# Patient Record
Sex: Male | Born: 1940 | Race: Black or African American | Hispanic: No | Marital: Married | Smoking: Former smoker
Health system: Southern US, Community
[De-identification: ages and names within clinical notes are randomized; demographics above are authoritative.]

## PROBLEM LIST (undated history)

## (undated) DIAGNOSIS — I2510 Atherosclerotic heart disease of native coronary artery without angina pectoris: Secondary | ICD-10-CM

## (undated) DIAGNOSIS — I10 Essential (primary) hypertension: Secondary | ICD-10-CM

## (undated) DIAGNOSIS — R1310 Dysphagia, unspecified: Secondary | ICD-10-CM

## (undated) DIAGNOSIS — I219 Acute myocardial infarction, unspecified: Secondary | ICD-10-CM

## (undated) DIAGNOSIS — E119 Type 2 diabetes mellitus without complications: Secondary | ICD-10-CM

## (undated) DIAGNOSIS — M1990 Unspecified osteoarthritis, unspecified site: Secondary | ICD-10-CM

## (undated) DIAGNOSIS — R350 Frequency of micturition: Secondary | ICD-10-CM

## (undated) DIAGNOSIS — I209 Angina pectoris, unspecified: Secondary | ICD-10-CM

## (undated) DIAGNOSIS — M109 Gout, unspecified: Secondary | ICD-10-CM

## (undated) DIAGNOSIS — D61818 Other pancytopenia: Secondary | ICD-10-CM

## (undated) DIAGNOSIS — N189 Chronic kidney disease, unspecified: Secondary | ICD-10-CM

## (undated) DIAGNOSIS — M5030 Other cervical disc degeneration, unspecified cervical region: Secondary | ICD-10-CM

## (undated) DIAGNOSIS — R0601 Orthopnea: Secondary | ICD-10-CM

## (undated) DIAGNOSIS — E785 Hyperlipidemia, unspecified: Secondary | ICD-10-CM

## (undated) DIAGNOSIS — K219 Gastro-esophageal reflux disease without esophagitis: Secondary | ICD-10-CM

## (undated) DIAGNOSIS — IMO0001 Reserved for inherently not codable concepts without codable children: Secondary | ICD-10-CM

## (undated) DIAGNOSIS — I639 Cerebral infarction, unspecified: Secondary | ICD-10-CM

## (undated) DIAGNOSIS — I509 Heart failure, unspecified: Secondary | ICD-10-CM

## (undated) HISTORY — PX: CARDIAC CATHETERIZATION: SHX172

## (undated) HISTORY — PX: EYE SURGERY: SHX253

## (undated) HISTORY — PX: APPENDECTOMY: SHX54

## (undated) HISTORY — PX: BACK SURGERY: SHX140

---

## 1992-02-25 HISTORY — PX: CORONARY ARTERY BYPASS GRAFT: SHX141

## 2006-05-07 ENCOUNTER — Ambulatory Visit: Payer: Self-pay | Admitting: Family Medicine

## 2006-08-24 ENCOUNTER — Ambulatory Visit: Payer: Self-pay | Admitting: Internal Medicine

## 2007-02-22 ENCOUNTER — Ambulatory Visit: Payer: Self-pay | Admitting: Gastroenterology

## 2007-03-14 ENCOUNTER — Emergency Department: Payer: Self-pay | Admitting: Emergency Medicine

## 2009-09-24 ENCOUNTER — Ambulatory Visit: Payer: Self-pay | Admitting: Oncology

## 2009-10-08 ENCOUNTER — Ambulatory Visit: Payer: Self-pay | Admitting: Oncology

## 2009-10-25 ENCOUNTER — Ambulatory Visit: Payer: Self-pay | Admitting: Oncology

## 2009-11-24 ENCOUNTER — Ambulatory Visit: Payer: Self-pay | Admitting: Oncology

## 2009-12-25 ENCOUNTER — Ambulatory Visit: Payer: Self-pay | Admitting: Oncology

## 2010-01-08 ENCOUNTER — Ambulatory Visit: Payer: Self-pay | Admitting: Gastroenterology

## 2010-01-24 ENCOUNTER — Ambulatory Visit: Payer: Self-pay | Admitting: Oncology

## 2011-04-20 DIAGNOSIS — I10 Essential (primary) hypertension: Secondary | ICD-10-CM | POA: Insufficient documentation

## 2011-04-20 DIAGNOSIS — E119 Type 2 diabetes mellitus without complications: Secondary | ICD-10-CM | POA: Insufficient documentation

## 2011-04-20 DIAGNOSIS — M109 Gout, unspecified: Secondary | ICD-10-CM | POA: Insufficient documentation

## 2011-04-20 DIAGNOSIS — I2510 Atherosclerotic heart disease of native coronary artery without angina pectoris: Secondary | ICD-10-CM | POA: Insufficient documentation

## 2011-05-21 DIAGNOSIS — I200 Unstable angina: Secondary | ICD-10-CM | POA: Insufficient documentation

## 2011-11-05 DIAGNOSIS — N529 Male erectile dysfunction, unspecified: Secondary | ICD-10-CM | POA: Insufficient documentation

## 2011-11-05 DIAGNOSIS — N401 Enlarged prostate with lower urinary tract symptoms: Secondary | ICD-10-CM | POA: Insufficient documentation

## 2011-11-05 DIAGNOSIS — N411 Chronic prostatitis: Secondary | ICD-10-CM | POA: Insufficient documentation

## 2011-11-05 DIAGNOSIS — R972 Elevated prostate specific antigen [PSA]: Secondary | ICD-10-CM | POA: Insufficient documentation

## 2012-06-10 ENCOUNTER — Ambulatory Visit: Payer: Self-pay | Admitting: Ophthalmology

## 2012-06-10 LAB — POTASSIUM: Potassium: 4 mmol/L (ref 3.5–5.1)

## 2012-06-29 ENCOUNTER — Ambulatory Visit: Payer: Self-pay | Admitting: Ophthalmology

## 2013-08-25 DIAGNOSIS — N183 Chronic kidney disease, stage 3 unspecified: Secondary | ICD-10-CM | POA: Insufficient documentation

## 2014-06-16 NOTE — Op Note (Signed)
PATIENT NAME:  Robert Parks, Robert Parks MR#:  [id number] DATE OF BIRTH:  May 19, 1940  DATE OF PROCEDURE:  06/29/2012  PREOPERATIVE DIAGNOSIS: Visually significant cataract of the left eye.   POSTOPERATIVE DIAGNOSIS: Visually significant cataract of the left eye.   OPERATIVE PROCEDURE: Cataract extraction by phacoemulsification with implant of intraocular lens to left eye.   SURGEON: Birder Robson, MD.   ANESTHESIA:  1. Managed anesthesia care.  2. Topical tetracaine drops followed by 2% Xylocaine jelly applied in the preoperative holding area.   COMPLICATIONS: None.   TECHNIQUE:  Stop and chop.   DESCRIPTION OF PROCEDURE: The patient was examined and consented in the preoperative holding area where the aforementioned topical anesthesia was applied to the left eye and then brought back to the Operating Room where the left eye was prepped and draped in the usual sterile ophthalmic fashion and a lid speculum was placed. A paracentesis was created with the side port blade and the anterior chamber was filled with viscoelastic. A near clear corneal incision was performed with the steel keratome. A continuous curvilinear capsulorrhexis was performed with a cystotome followed by the capsulorrhexis forceps. Hydrodissection and hydrodelineation were carried out with BSS on a blunt cannula. The lens was removed in a stop and chop technique and the remaining cortical material was removed with the irrigation-aspiration handpiece. The capsular bag was inflated with viscoelastic and the Tecnis ZCB00 23.5-diopter lens, serial number [id number], was placed in the capsular bag without complication. The remaining viscoelastic was removed from the eye with the irrigation-aspiration handpiece. The wounds were hydrated. The anterior chamber was flushed with Miostat and the eye was inflated to physiologic pressure. 0.1 mL of cefuroxime concentration 10 mg/mL was placed in the anterior chamber. The wounds were found to be water  tight. The eye was dressed with Vigamox. The patient was given protective glasses to wear throughout the day and a shield with which to sleep tonight. The patient was also given drops with which to begin a drop regimen today and will follow-up with me in one day.    ____________________________ Livingston Diones. Novalie Leamy, MD wlp:gb D: 06/29/2012 19:33:45 ET T: 06/30/2012 04:01:42 ET JOB#: [id number]  cc: Drakkar Medeiros L. Alfonse Garringer, MD, <Dictator> Livingston Diones Darvis Croft MD ELECTRONICALLY SIGNED 07/05/2012 14:35

## 2014-08-21 ENCOUNTER — Encounter
Admission: RE | Disposition: A | Discharge status: Home / Self Care | Payer: Self-pay | Source: Ambulatory Visit | Attending: Gastroenterology

## 2014-08-21 ENCOUNTER — Ambulatory Visit: Payer: Medicare HMO | Admitting: Anesthesiology

## 2014-08-21 ENCOUNTER — Ambulatory Visit
Admission: RE | Admit: 2014-08-21 | Discharge: 2014-08-21 | Disposition: A | Discharge status: Home / Self Care | Payer: Medicare HMO | Source: Ambulatory Visit | Attending: Gastroenterology | Admitting: Gastroenterology

## 2014-08-21 ENCOUNTER — Encounter: Payer: Self-pay | Admitting: Gastroenterology

## 2014-08-21 DIAGNOSIS — Z86010 Personal history of colonic polyps: Secondary | ICD-10-CM | POA: Diagnosis present

## 2014-08-21 DIAGNOSIS — Z87891 Personal history of nicotine dependence: Secondary | ICD-10-CM | POA: Insufficient documentation

## 2014-08-21 DIAGNOSIS — K5730 Diverticulosis of large intestine without perforation or abscess without bleeding: Secondary | ICD-10-CM | POA: Insufficient documentation

## 2014-08-21 DIAGNOSIS — Z7982 Long term (current) use of aspirin: Secondary | ICD-10-CM | POA: Insufficient documentation

## 2014-08-21 DIAGNOSIS — I2510 Atherosclerotic heart disease of native coronary artery without angina pectoris: Secondary | ICD-10-CM | POA: Insufficient documentation

## 2014-08-21 DIAGNOSIS — D124 Benign neoplasm of descending colon: Secondary | ICD-10-CM | POA: Insufficient documentation

## 2014-08-21 DIAGNOSIS — D123 Benign neoplasm of transverse colon: Secondary | ICD-10-CM | POA: Diagnosis not present

## 2014-08-21 DIAGNOSIS — Z79899 Other long term (current) drug therapy: Secondary | ICD-10-CM | POA: Insufficient documentation

## 2014-08-21 DIAGNOSIS — I10 Essential (primary) hypertension: Secondary | ICD-10-CM | POA: Diagnosis not present

## 2014-08-21 HISTORY — DX: Hyperlipidemia, unspecified: E78.5

## 2014-08-21 HISTORY — DX: Type 2 diabetes mellitus without complications: E11.9

## 2014-08-21 HISTORY — DX: Essential (primary) hypertension: I10

## 2014-08-21 HISTORY — PX: COLONOSCOPY WITH PROPOFOL: SHX5780

## 2014-08-21 HISTORY — DX: Atherosclerotic heart disease of native coronary artery without angina pectoris: I25.10

## 2014-08-21 LAB — GLUCOSE, CAPILLARY: Glucose-Capillary: 95 mg/dL (ref 65–99)

## 2014-08-21 SURGERY — COLONOSCOPY WITH PROPOFOL
Anesthesia: General

## 2014-08-21 MED ORDER — PROPOFOL INFUSION 10 MG/ML OPTIME
INTRAVENOUS | Status: DC | PRN
  Administered 2014-08-21: 160 ug/kg/min via INTRAVENOUS

## 2014-08-21 MED ORDER — MIDAZOLAM HCL 2 MG/2ML IJ SOLN
INTRAMUSCULAR | Status: DC | PRN
  Administered 2014-08-21: 1 mg via INTRAVENOUS

## 2014-08-21 MED ORDER — SODIUM CHLORIDE 0.9 % IV SOLN
INTRAVENOUS | Status: DC
  Administered 2014-08-21: 10:00:00 via INTRAVENOUS

## 2014-08-21 MED ORDER — FENTANYL CITRATE (PF) 100 MCG/2ML IJ SOLN
INTRAMUSCULAR | Status: DC | PRN
  Administered 2014-08-21: 50 ug via INTRAVENOUS

## 2014-08-21 MED ORDER — PROPOFOL 10 MG/ML IV BOLUS
INTRAVENOUS | Status: DC | PRN
  Administered 2014-08-21: 50 mg via INTRAVENOUS

## 2014-08-21 MED ORDER — LIDOCAINE HCL (CARDIAC) 20 MG/ML IV SOLN
INTRAVENOUS | Status: DC | PRN
  Administered 2014-08-21: 40 mg via INTRAVENOUS

## 2014-08-21 NOTE — Anesthesia Procedure Notes (Signed)
Date/Time: 08/21/2014 9:54 AM Performed by: Doreen Salvage Pre-anesthesia Checklist: Patient identified, Emergency Drugs available, Suction available and Patient being monitored Patient Re-evaluated:Patient Re-evaluated prior to inductionOxygen Delivery Method: Nasal cannula

## 2014-08-21 NOTE — Transfer of Care (Signed)
Immediate Anesthesia Transfer of Care Note  Patient: Robert Parks  Procedure(s) Performed: Procedure(s): COLONOSCOPY WITH PROPOFOL (N/A)  Patient Location: PACU and Endoscopy Unit  Anesthesia Type:General  Level of Consciousness: sedated  Airway & Oxygen Therapy: Patient Spontanous Breathing and Patient connected to nasal cannula oxygen  Post-op Assessment: Report given to RN and Post -op Vital signs reviewed and stable  Post vital signs: Reviewed and stable  Last Vitals:  Filed Vitals:   08/21/14 1017  BP: 110/65  Pulse: 58  Temp: 35.8 C  Resp: 12    Complications: No apparent anesthesia complications

## 2014-08-21 NOTE — H&P (Signed)
    Primary Care Physician:  WHITE, Arlie Solomons, FNP Primary Gastroenterologist:  Dr. Candace Cruise  Pre-Procedure History & Physical: HPI:  Jarquavious Fentress is a 74 y.o. male is here for a colonoscopy.  History reviewed. No pertinent past medical history.  History reviewed. No pertinent past surgical history.  Prior to Admission medications   Medication Sig Start Date End Date Taking? Authorizing Provider  amLODipine (NORVASC) 10 MG tablet Take 10 mg by mouth daily.   Yes Historical Provider, MD  aspirin 81 MG tablet Take 81 mg by mouth daily.   Yes Historical Provider, MD  atorvastatin (LIPITOR) 10 MG tablet Take 10 mg by mouth daily.   Yes Historical Provider, MD  colchicine 0.6 MG tablet Take 0.6 mg by mouth daily.   Yes Historical Provider, MD  hydrALAZINE (APRESOLINE) 100 MG tablet Take 100 mg by mouth 2 (two) times daily.   Yes Historical Provider, MD  isosorbide mononitrate (IMDUR) 120 MG 24 hr tablet Take 120 mg by mouth daily.   Yes Historical Provider, MD  metoprolol (TOPROL-XL) 200 MG 24 hr tablet Take 200 mg by mouth daily.   Yes Historical Provider, MD  polyethylene glycol-electrolytes (NULYTELY/GOLYTELY) 420 G solution Take 4,000 mLs by mouth once.   Yes Historical Provider, MD  potassium chloride SA (K-DUR,KLOR-CON) 20 MEQ tablet Take 20 mEq by mouth daily.   Yes Historical Provider, MD  ranolazine (RANEXA) 1000 MG SR tablet Take 500 mg by mouth 2 (two) times daily.   Yes Historical Provider, MD  tamsulosin (FLOMAX) 0.4 MG CAPS capsule Take 0.4 mg by mouth daily.   Yes Historical Provider, MD    Allergies as of 07/26/2014  . (Not on File)    History reviewed. No pertinent family history.  History   Social History  . Marital Status: Married    Spouse Name: N/A  . Number of Children: N/A  . Years of Education: N/A   Occupational History  . Not on file.   Social History Main Topics  . Smoking status: Not on file  . Smokeless tobacco: Not on file  . Alcohol Use: Not on file   . Drug Use: Not on file  . Sexual Activity: Not on file   Other Topics Concern  . Not on file   Social History Narrative  . No narrative on file    Review of Systems: See HPI, otherwise negative ROS  Physical Exam: There were no vitals taken for this visit. General:   Alert,  pleasant and cooperative in NAD Head:  Normocephalic and atraumatic. Neck:  Supple; no masses or thyromegaly. Lungs:  Clear throughout to auscultation.    Heart:  Regular rate and rhythm. Abdomen:  Soft, nontender and nondistended. Normal bowel sounds, without guarding, and without rebound.   Neurologic:  Alert and  oriented x4;  grossly normal neurologically.  Impression/Plan: Javyn Havlin is here for an colonoscopy to be performed for personal hx of colon polyps.  Risks, benefits, limitations, and alternatives regarding colonoscopy have been reviewed with the patient.  Questions have been answered.  All parties agreeable.   Isaish Alemu, Lupita Dawn, MD  08/21/2014, 8:55 AM

## 2014-08-21 NOTE — Anesthesia Preprocedure Evaluation (Signed)
Anesthesia Evaluation  Patient identified by MRN, date of birth, ID band Patient awake    Reviewed: Allergy & Precautions, H&P , NPO status , Patient's Chart, lab work & pertinent test results, reviewed documented beta blocker date and time   Airway Mallampati: II  TM Distance: >3 FB Neck ROM: full    Dental no notable dental hx.    Pulmonary neg pulmonary ROS, former smoker,  breath sounds clear to auscultation  Pulmonary exam normal       Cardiovascular Exercise Tolerance: Poor hypertension, + CAD negative cardio ROS  Rhythm:regular Rate:Normal     Neuro/Psych negative neurological ROS  negative psych ROS   GI/Hepatic negative GI ROS, Neg liver ROS,   Endo/Other  negative endocrine ROSdiabetes  Renal/GU negative Renal ROS  negative genitourinary   Musculoskeletal   Abdominal   Peds  Hematology negative hematology ROS (+)   Anesthesia Other Findings   Reproductive/Obstetrics negative OB ROS                             Anesthesia Physical Anesthesia Plan  ASA: III  Anesthesia Plan: General   Post-op Pain Management:    Induction:   Airway Management Planned:   Additional Equipment:   Intra-op Plan:   Post-operative Plan:   Informed Consent: I have reviewed the patients History and Physical, chart, labs and discussed the procedure including the risks, benefits and alternatives for the proposed anesthesia with the patient or authorized representative who has indicated his/her understanding and acceptance.   Dental Advisory Given  Plan Discussed with: CRNA  Anesthesia Plan Comments:         Anesthesia Quick Evaluation

## 2014-08-21 NOTE — Op Note (Signed)
[facility] Gastroenterology Patient Name: Robert Parks Procedure Date: 08/21/2014 9:52 AM MRN: [id number] Account #: [id number] Date of Birth: [birth year] Admit Type: Outpatient Age: 74 Room: [place] ENDO ROOM 4 Gender: Male Note Status: Finalized Procedure:         Colonoscopy Indications:       Personal history of colonic polyps Providers:         Lupita Dawn. Candace Cruise, MD Referring MD:      Arlie Solomons. White (Referring MD) Medicines:         Monitored Anesthesia Care Complications:     No immediate complications. Procedure:         Pre-Anesthesia Assessment:                    - Prior to the procedure, a History and Physical was                     performed, and patient medications, allergies and                     sensitivities were reviewed. The patient's tolerance of                     previous anesthesia was reviewed.                    - The risks and benefits of the procedure and the sedation                     options and risks were discussed with the patient. All                     questions were answered and informed consent was obtained.                    - After reviewing the risks and benefits, the patient was                     deemed in satisfactory condition to undergo the procedure.                    After obtaining informed consent, the colonoscope was                     passed under direct vision. Throughout the procedure, the                     patient's blood pressure, pulse, and oxygen saturations                     were monitored continuously. The Olympus CF-H180AL                     colonoscope ( S#: [id number] ) was introduced through the                     anus and advanced to the the cecum, identified by                     appendiceal orifice and ileocecal valve. The colonoscopy                     was performed without difficulty. The patient tolerated  the procedure well. The quality of the bowel preparation               was good. Findings:      Multiple small-mouthed diverticula were found in the ascending colon.      A small polyp was found in the transverse colon. The polyp was sessile.       The polyp was removed with a cold snare. Resection and retrieval were       complete.      Three sessile polyps were found in the descending colon. The polyps were       small in size. These polyps were removed with a cold snare. Resection       and retrieval were complete.      The exam was otherwise without abnormality. Impression:        - Diverticulosis in the ascending colon.                    - One small polyp in the transverse colon. Resected and                     retrieved.                    - Three small polyps in the descending colon. Resected and                     retrieved.                    - The examination was otherwise normal. Recommendation:    - Discharge patient to home.                    - Await pathology results.                    - Repeat colonoscopy in 3 years for surveillance based on                     pathology results.                    - The findings and recommendations were discussed with the                     patient.                    - Hold bASA for at least 3 more days.. Procedure Code(s): --- Professional ---                    [phone], Colonoscopy, flexible; with removal of tumor(s),                     polyp(s), or other lesion(s) by snare technique Diagnosis Code(s): --- Professional ---                    D12.3, Benign neoplasm of transverse colon                    D12.4, Benign neoplasm of descending colon                    Z86.010, Personal history of colonic polyps                    K57.30, Diverticulosis of large intestine  without                     perforation or abscess without bleeding CPT copyright 2014 American Medical Association. All rights reserved. The codes documented in this report are preliminary and upon coder review may  be  revised to meet current compliance requirements. Hulen Luster, MD 08/21/2014 10:12:58 AM This report has been signed electronically. Number of Addenda: 0 Note Initiated On: 08/21/2014 9:52 AM Scope Withdrawal Time: 0 hours 9 minutes 7 seconds  Total Procedure Duration: 0 hours 12 minutes 46 seconds       [facility]

## 2014-08-22 LAB — SURGICAL PATHOLOGY

## 2014-08-22 NOTE — Anesthesia Postprocedure Evaluation (Signed)
  Anesthesia Post-op Note  Patient: Robert Parks  Procedure(s) Performed: Procedure(s): COLONOSCOPY WITH PROPOFOL (N/A)  Anesthesia type:General  Patient location: PACU  Post pain: Pain level controlled  Post assessment: Post-op Vital signs reviewed, Patient's Cardiovascular Status Stable, Respiratory Function Stable, Patent Airway and No signs of Nausea or vomiting  Post vital signs: Reviewed and stable  Last Vitals:  Filed Vitals:   08/21/14 1050  BP: 130/72  Pulse: 58  Temp:   Resp: 20    Level of consciousness: awake, alert  and patient cooperative  Complications: No apparent anesthesia complications

## 2014-08-23 ENCOUNTER — Encounter: Payer: Self-pay | Admitting: Gastroenterology

## 2015-05-16 DIAGNOSIS — R531 Weakness: Secondary | ICD-10-CM | POA: Insufficient documentation

## 2015-05-16 DIAGNOSIS — M542 Cervicalgia: Secondary | ICD-10-CM | POA: Insufficient documentation

## 2015-05-31 ENCOUNTER — Inpatient Hospital Stay: Payer: Medicare HMO | Admitting: Oncology

## 2015-06-06 ENCOUNTER — Other Ambulatory Visit: Payer: Self-pay | Admitting: Neurology

## 2015-06-06 DIAGNOSIS — M542 Cervicalgia: Secondary | ICD-10-CM

## 2015-06-06 DIAGNOSIS — R531 Weakness: Secondary | ICD-10-CM

## 2015-06-08 ENCOUNTER — Ambulatory Visit
Admission: RE | Admit: 2015-06-08 | Discharge: 2015-06-08 | Disposition: A | Discharge status: Home / Self Care | Payer: Medicare HMO | Source: Ambulatory Visit | Attending: Neurology | Admitting: Neurology

## 2015-06-08 DIAGNOSIS — R9089 Other abnormal findings on diagnostic imaging of central nervous system: Secondary | ICD-10-CM | POA: Diagnosis not present

## 2015-06-08 DIAGNOSIS — J320 Chronic maxillary sinusitis: Secondary | ICD-10-CM | POA: Insufficient documentation

## 2015-06-08 DIAGNOSIS — M4802 Spinal stenosis, cervical region: Secondary | ICD-10-CM | POA: Diagnosis not present

## 2015-06-08 DIAGNOSIS — M542 Cervicalgia: Secondary | ICD-10-CM

## 2015-06-08 DIAGNOSIS — R531 Weakness: Secondary | ICD-10-CM | POA: Insufficient documentation

## 2015-06-19 ENCOUNTER — Other Ambulatory Visit: Payer: Self-pay | Admitting: Neurosurgery

## 2015-06-19 ENCOUNTER — Inpatient Hospital Stay: Payer: Medicare HMO | Admitting: Oncology

## 2015-06-22 ENCOUNTER — Encounter (HOSPITAL_COMMUNITY): Payer: Self-pay

## 2015-06-22 ENCOUNTER — Encounter (HOSPITAL_COMMUNITY)
Admission: RE | Admit: 2015-06-22 | Discharge: 2015-06-22 | Disposition: A | Discharge status: Home / Self Care | Payer: Medicare HMO | Source: Ambulatory Visit | Attending: Neurosurgery | Admitting: Neurosurgery

## 2015-06-22 DIAGNOSIS — I2510 Atherosclerotic heart disease of native coronary artery without angina pectoris: Secondary | ICD-10-CM | POA: Insufficient documentation

## 2015-06-22 DIAGNOSIS — Z79899 Other long term (current) drug therapy: Secondary | ICD-10-CM | POA: Diagnosis not present

## 2015-06-22 DIAGNOSIS — Z01818 Encounter for other preprocedural examination: Secondary | ICD-10-CM | POA: Insufficient documentation

## 2015-06-22 DIAGNOSIS — Z01812 Encounter for preprocedural laboratory examination: Secondary | ICD-10-CM | POA: Diagnosis not present

## 2015-06-22 DIAGNOSIS — Z87891 Personal history of nicotine dependence: Secondary | ICD-10-CM | POA: Insufficient documentation

## 2015-06-22 DIAGNOSIS — I110 Hypertensive heart disease with heart failure: Secondary | ICD-10-CM | POA: Diagnosis not present

## 2015-06-22 DIAGNOSIS — E1122 Type 2 diabetes mellitus with diabetic chronic kidney disease: Secondary | ICD-10-CM | POA: Insufficient documentation

## 2015-06-22 DIAGNOSIS — N183 Chronic kidney disease, stage 3 (moderate): Secondary | ICD-10-CM | POA: Diagnosis not present

## 2015-06-22 DIAGNOSIS — Z7982 Long term (current) use of aspirin: Secondary | ICD-10-CM | POA: Insufficient documentation

## 2015-06-22 DIAGNOSIS — E785 Hyperlipidemia, unspecified: Secondary | ICD-10-CM | POA: Diagnosis not present

## 2015-06-22 DIAGNOSIS — K219 Gastro-esophageal reflux disease without esophagitis: Secondary | ICD-10-CM | POA: Insufficient documentation

## 2015-06-22 DIAGNOSIS — Z951 Presence of aortocoronary bypass graft: Secondary | ICD-10-CM | POA: Insufficient documentation

## 2015-06-22 HISTORY — DX: Chronic kidney disease, unspecified: N18.9

## 2015-06-22 HISTORY — DX: Other cervical disc degeneration, unspecified cervical region: M50.30

## 2015-06-22 HISTORY — DX: Gout, unspecified: M10.9

## 2015-06-22 HISTORY — DX: Gastro-esophageal reflux disease without esophagitis: K21.9

## 2015-06-22 HISTORY — DX: Unspecified osteoarthritis, unspecified site: M19.90

## 2015-06-22 HISTORY — DX: Frequency of micturition: R35.0

## 2015-06-22 LAB — BASIC METABOLIC PANEL
Anion gap: 9 (ref 5–15)
BUN: 19 mg/dL (ref 6–20)
CHLORIDE: 101 mmol/L (ref 101–111)
CO2: 26 mmol/L (ref 22–32)
Calcium: 9.3 mg/dL (ref 8.9–10.3)
Creatinine, Ser: 2.24 mg/dL — ABNORMAL HIGH (ref 0.61–1.24)
GFR calc non Af Amer: 27 mL/min — ABNORMAL LOW (ref >60)
GFR, EST AFRICAN AMERICAN: 31 mL/min — AB (ref >60)
Glucose, Bld: 113 mg/dL — ABNORMAL HIGH (ref 65–99)
POTASSIUM: 4.3 mmol/L (ref 3.5–5.1)
SODIUM: 136 mmol/L (ref 135–145)

## 2015-06-22 LAB — CBC
HEMATOCRIT: 29.9 % — AB (ref 39.0–52.0)
HEMOGLOBIN: 9.8 g/dL — AB (ref 13.0–17.0)
MCH: 29.3 pg (ref 26.0–34.0)
MCHC: 32.8 g/dL (ref 30.0–36.0)
MCV: 89.5 fL (ref 78.0–100.0)
Platelets: 88 10*3/uL — ABNORMAL LOW (ref 150–400)
RBC: 3.34 MIL/uL — AB (ref 4.22–5.81)
RDW: 14.1 % (ref 11.5–15.5)
WBC: 4.2 10*3/uL (ref 4.0–10.5)

## 2015-06-22 LAB — SURGICAL PCR SCREEN
MRSA, PCR: NEGATIVE
Staphylococcus aureus: NEGATIVE

## 2015-06-22 NOTE — Progress Notes (Signed)
   How to Manage Your Diabetes Before and After Surgery  Why is it important to control my blood sugar before and after surgery? . Improving blood sugar levels before and after surgery helps healing and can limit problems. . A way of improving blood sugar control is eating a healthy diet by: o  Eating less sugar and carbohydrates o  Increasing activity/exercise o  Talking with your doctor about reaching your blood sugar goals . High blood sugars (greater than 180 mg/dL) can raise your risk of infections and slow your recovery, so you will need to focus on controlling your diabetes during the weeks before surgery. . Make sure that the doctor who takes care of your diabetes knows about your planned surgery including the date and location.  How do I manage my blood sugar before surgery? . Check your blood sugar at least 4 times a day, starting 2 days before surgery, to make sure that the level is not too high or low. o Check your blood sugar the morning of your surgery when you wake up and every 2 hours until you get to the Short Stay unit. . If your blood sugar is less than 70 mg/dL, you will need to treat for low blood sugar: o Do not take insulin. o Treat a low blood sugar (less than 70 mg/dL) with  cup of clear juice (cranberry or apple), 4 glucose tablets, OR glucose gel. o Recheck blood sugar in 15 minutes after treatment (to make sure it is greater than 70 mg/dL). If your blood sugar is not greater than 70 mg/dL on recheck, call [phone] for further instructions. . Report your blood sugar to the short stay nurse when you get to Short Stay.  . If you are admitted to the hospital after surgery: o Your blood sugar will be checked by the staff and you will probably be given insulin after surgery (instead of oral diabetes medicines) to make sure you have good blood sugar levels. o The goal for blood sugar control after surgery is 80-180 mg/dL.           

## 2015-06-22 NOTE — Pre-Procedure Instructions (Addendum)
    Robert Parks  06/22/2015   Your procedure is scheduled on Tuesday, May 2.  Report to [facility] Admitting at 9:00 A.M.   Call this number if you have problems the morning of surgery: [phone]                For any other questions, please call [phone], Monday - Friday 8 AM - 4 PM.   Remember:  Do not eat food or drink liquids after midnight:  Monday May 1  Take these medicines the morning of surgery with A SIP OF WATER :Atorvastatin , Hydralazine, Colchicine, Isosorbide, Metoprolol, Ranexa, Tamsulosin   Do not wear jewelry, make-up or nail polish.  Do not wear lotions, powders, or perfumes.   Men may shave face and neck.  Do not bring valuables to the hospital.  [facility] is not responsible for any belongings or valuables.  Contacts, dentures or bridgework may not be worn into surgery.  Leave your suitcase in the car.  After surgery it may be brought to your room.  For patients admitted to the hospital, discharge time will be determined by your treatment team.  Special instructions:  Review  [facility] - Preparing For Surgery.  Please read over the following fact sheets that you were given. Pain Booklet, Coughing and Deep Breathing, MRSA Information and Surgical Site Infection Prevention

## 2015-06-22 NOTE — Progress Notes (Addendum)
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  Mr Has Type II diabetes, was on Anna, but it has been discounted.  Mr Delpozo checks CBG's at times, this am it was 106.  CBG is low at times, we went over instructions on treatment of CBG , 70 on the morning of surgery.  Patient and his wife verbalized understanding.  Dr Doree Fudge is PCP, I have requested records from her office.  Cardiologist is Dr Marylen Ponto, nephrologist is Dr Juanito Doom with [facility]- last creatine at Dr Juanito Doom office was 2.0.

## 2015-06-23 LAB — HEMOGLOBIN A1C
HEMOGLOBIN A1C: 5.6 % (ref 4.8–5.6)
Mean Plasma Glucose: 114 mg/dL

## 2015-06-25 ENCOUNTER — Encounter (HOSPITAL_COMMUNITY): Payer: Self-pay | Admitting: Vascular Surgery

## 2015-06-25 NOTE — Progress Notes (Addendum)
Anesthesia chart review: Patient is a 75 year old male scheduled for C3-4, C4-5, C5-6 ACDF on 06/26/15 by Dr. Joya Salm.  History includes former smoker, hypertension, hyperlipidemia, diet controlled diabetes mellitus type 2, CAD s/p CABG (RIMA-RCA, LIMA-OM, Gastroepiploic-LAD) '94, GERD, chronic kidney disease stage IIIB, arthritis, appendectomy, back surgery.  PCP is listed as Dr. Benny Lennert or Saunders Revel, FNP in [place]. Neurologist is Dr. Gurney Maxin.  Nephrologist is Dr. Juanito Doom with [place] ([facility]), last visit 01/24/15. 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   Cardiologist is Dr. Posey Boyer with [facility], last visit 05/30/15 (Care Everywhere). His note states, "He has had a long-standing chronic stable angina which is severely lifestyle limiting. He underwent heart catheterization approximately one year ago which showed this gastroepiploic to his LAD was occluded. There was some moderate to severe lesion in his proximal mid LAD as well as a severe lesion in his distal LAD. In addition his right coronary artery was filled via patent RIMA graft however there was a severe lesion in the distal PDA. At that time it was elected to treat him medically and he was referred to [facility] [Dr. Fritz Pickerel Crawford] at [facility]. Ever he never actually had this arranged. On his prior visit he stated that he had daily angina with minimal exertion. He states that he can only walk approximately 25 yards before getting chest discomfort and he can only walk about 50 yards before having to stop. This affects his activities of daily living to the point that he cannot do many things that he previously did. He denies any heart failure symptoms including orthopnea or PND. We offered him coronary angiography and possible revascularization however when we attempted to schedule the procedure his blockage had improved and that he didn't want to have the procedure." (In  review of several cardiology notes in Epic, it appears his "heart catheterization approximately one year ago" is referring to a [place] in 12/2012 which is mentioned in notes starting 02/25/13. In 11/2014, after patient had not been seen by [facility], Dr. Marylen Ponto ordered a nuclear stress to evaluation for ischemia in attempt to plan for potential PCI. Stress test suggested ischemia in the inferior region which could be related to the PDA which was felt to be the most challenging lesion to intervene. There was no ischemia in the anterior territory. His chest discomfort improved with anti-anginal therapy, so plans for [facility] were placed on hold; However, he continued to have fatigue and SOB. An exercise program was implemented in 03/2015, and if fatigue did not improve then Dr. Marylen Ponto would consider revascularization to the PDA or LAD. At his 05/30/15 visit, he was seen for volume overload and SOB, but no chest pain. In regards to CAD, Dr. Marylen Ponto wrote, "We will continue to follow this to see if this improves with diuretics." Approximately 4 week follow-up recommended.  Meds include aspirin, Lipitor, colchicine, Lasix, hydralazine, Imdur, Toprol, nitro, KCl, Ranexa, Flomax.  05/30/15 EKG [facility]): NSR, LAD, LVH with QRS widening and repolarization abnormality. Interpreting physician did not think there was any significant change when compared to 06/02/14 tracing.   04/19/15 Echo ([place]; Care Everywhere): Result Narrative:  Left ventricular hypertrophy - mild  Borderline left ventricular systolic function  Mitral regurgitation - mild  Dilated left atrium - mild  Normal right ventricular systolic function  Tricuspid regurgitation - moderate  Dilated right atrium - mild  Mildly elevated right atrial pressure  Diastolic dysfunction - grade II (elevated  filling pressures)  12/01/14 Nuclear stress test [facility]; Care Everywhere): Impressions: - Abnormal myocardial perfusion study - There is a small in  size, mild in severity, completely reversible defect involving the apical inferior segment. This is consistent with possible ischemia. - There is a small in size, moderate in severity, partially reversible defect involving the mid anterolateral and basal anterolateral segments. This is consistent with ischemia and cannot rule out scar. - During stress: Global systolic function is moderately to severely reduced. The ejection fraction calculated at 29%.  - Comparison with the study of 2013 is not reliable due to the non-attenuation correction on the prior study, but the anterolateral defect appears new - Incidentally noted is likely gallstones (EF by echo on 12/01/14 was 55%.)  01/12/13 Cardiac cath [facility]; Care Everywhere): CORONARY ANGIOGRAPHY - The left main (LMCA) is a large-caliber vessel that originates from the left coronary sinus. It bifurcates into the left anterior descending (LAD) and left circumflex (LCx) arteries. There is no angiographic evidence of significant disease in the LMCA. - The LAD is a large-caliber vessel that gives off four small-sized diagonal (D) branches before it wraps around the apex. There is diffuse disease in the LAD with a 40% mid lesion and up to 70% lesion distally. - The LCx is a medium-caliber vessel that gives off one significant obtuse marginal (OM) branch and then continues as a small vessel in the AV groove. OM1 is occluded proximally and is filled distally by the LIMA graft. There is diffuse disease in the LCx up to 70%. - The right coronary artery (RCA) was not engaged in this study as it is known to be occluded. Grafts: LIMA to the OM is patent. RIMA to the RCA is patent. Gastroepiploic known to be occluded- not re-injected. SUMMARY FINDINGS: Coronary Disease Significant three vessel coronary disease. Patent RIMA and LIMA No targets ammenable to percutaneous intervention. Filling Pressure/LVEDP Normal left ventricular end diastolic filling  pressure. (LVEDP = 10 mmHg) PLAN/DISPOSITION AND FOLLOWUP: Recommendations: Continue aggressive risk factor modification  06/08/15 MRI c-spine: IMPRESSION: 1. 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 Recommend spine surgery consultation. 2. 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  Preoperative labs noted. Cr 2.24. BUN 19. (In Care Everywhere Cr trends have been 1.75-2.1 since 05/16/11.) Glucose 113. H/H 9.8/29.9. PLT 88K. (There are no recent LFTs, but PLT count has been 84-111K since 05/21/11 in [place]. HGB has been in the 10-11 range since 05/21/11.) A1c 5.6. With thrombocytopenia, would plan on T&S and HFP preoperatively. PT/PTT were WNL in 12/2012 (PLT were 84K at that time).  I called to review abnormal labs and cardiac history with Janett Billow at Dr. Harley Hallmark office. He is recommending medical clearance due to thrombocytopenia. (It does not appear new, but I don't see any documentation in regards to the etiology.)  I also recommended that they request cardiac clearance since a decision to proceed with LHC appears to still be in limbo (cardiology is still following his symptomology). Anesthesiologist Dr. Lissa Hoard agrees with this plan. Timing of surgery will depend on when he is cleared for surgery from a cardiology and medical standpoint.   George Hugh [facility] [place] [facility]/Anesthesiology Phone [phone] 06/25/2015 12:13 PM

## 2015-06-26 ENCOUNTER — Encounter (HOSPITAL_COMMUNITY): Admission: RE | Payer: Self-pay | Source: Ambulatory Visit

## 2015-06-26 ENCOUNTER — Inpatient Hospital Stay (HOSPITAL_COMMUNITY): Admission: RE | Admit: 2015-06-26 | Payer: Medicare HMO | Source: Ambulatory Visit | Admitting: Neurosurgery

## 2015-06-26 SURGERY — ANTERIOR CERVICAL DECOMPRESSION/DISCECTOMY FUSION 3 LEVELS
Anesthesia: General

## 2015-07-11 ENCOUNTER — Inpatient Hospital Stay: Payer: Medicare HMO

## 2015-07-11 ENCOUNTER — Inpatient Hospital Stay: Payer: Medicare HMO | Attending: Oncology | Admitting: Oncology

## 2015-07-11 VITALS — BP 108/64 | HR 64 | Temp 96.7°F | Resp 16

## 2015-07-11 DIAGNOSIS — I2510 Atherosclerotic heart disease of native coronary artery without angina pectoris: Secondary | ICD-10-CM

## 2015-07-11 DIAGNOSIS — E785 Hyperlipidemia, unspecified: Secondary | ICD-10-CM

## 2015-07-11 DIAGNOSIS — I129 Hypertensive chronic kidney disease with stage 1 through stage 4 chronic kidney disease, or unspecified chronic kidney disease: Secondary | ICD-10-CM | POA: Diagnosis not present

## 2015-07-11 DIAGNOSIS — K219 Gastro-esophageal reflux disease without esophagitis: Secondary | ICD-10-CM | POA: Insufficient documentation

## 2015-07-11 DIAGNOSIS — Z79899 Other long term (current) drug therapy: Secondary | ICD-10-CM | POA: Diagnosis not present

## 2015-07-11 DIAGNOSIS — D72819 Decreased white blood cell count, unspecified: Secondary | ICD-10-CM | POA: Insufficient documentation

## 2015-07-11 DIAGNOSIS — E1122 Type 2 diabetes mellitus with diabetic chronic kidney disease: Secondary | ICD-10-CM

## 2015-07-11 DIAGNOSIS — D696 Thrombocytopenia, unspecified: Secondary | ICD-10-CM | POA: Diagnosis not present

## 2015-07-11 DIAGNOSIS — Z7982 Long term (current) use of aspirin: Secondary | ICD-10-CM | POA: Diagnosis not present

## 2015-07-11 DIAGNOSIS — M109 Gout, unspecified: Secondary | ICD-10-CM | POA: Diagnosis not present

## 2015-07-11 DIAGNOSIS — D649 Anemia, unspecified: Secondary | ICD-10-CM | POA: Insufficient documentation

## 2015-07-11 DIAGNOSIS — R5383 Other fatigue: Secondary | ICD-10-CM | POA: Diagnosis not present

## 2015-07-11 DIAGNOSIS — Z87891 Personal history of nicotine dependence: Secondary | ICD-10-CM | POA: Diagnosis not present

## 2015-07-11 DIAGNOSIS — N189 Chronic kidney disease, unspecified: Secondary | ICD-10-CM | POA: Insufficient documentation

## 2015-07-11 DIAGNOSIS — M1990 Unspecified osteoarthritis, unspecified site: Secondary | ICD-10-CM | POA: Diagnosis not present

## 2015-07-11 DIAGNOSIS — D61818 Other pancytopenia: Secondary | ICD-10-CM | POA: Diagnosis present

## 2015-07-11 LAB — CBC
HCT: 29.9 % — ABNORMAL LOW (ref 40.0–52.0)
HEMOGLOBIN: 10.1 g/dL — AB (ref 13.0–18.0)
MCH: 31.1 pg (ref 26.0–34.0)
MCHC: 33.8 g/dL (ref 32.0–36.0)
MCV: 91.9 fL (ref 80.0–100.0)
Platelets: 74 10*3/uL — ABNORMAL LOW (ref 150–440)
RBC: 3.26 MIL/uL — AB (ref 4.40–5.90)
RDW: 15.9 % — ABNORMAL HIGH (ref 11.5–14.5)
WBC: 2.8 10*3/uL — ABNORMAL LOW (ref 3.8–10.6)

## 2015-07-11 LAB — IRON AND TIBC
Iron: 63 ug/dL (ref 45–182)
SATURATION RATIOS: 23 % (ref 17.9–39.5)
TIBC: 274 ug/dL (ref 250–450)
UIBC: 211 ug/dL

## 2015-07-11 LAB — LACTATE DEHYDROGENASE: LDH: 131 U/L (ref 98–192)

## 2015-07-11 LAB — FERRITIN: Ferritin: 107 ng/mL (ref 24–336)

## 2015-07-11 LAB — VITAMIN B12: Vitamin B-12: 299 pg/mL (ref 180–914)

## 2015-07-11 LAB — FOLATE: FOLATE: 21.5 ng/mL (ref >5.9)

## 2015-07-12 LAB — PROTEIN ELECTROPHORESIS, SERUM
A/G RATIO SPE: 1.5 (ref 0.7–1.7)
ALBUMIN ELP: 3.7 g/dL (ref 2.9–4.4)
ALPHA-1-GLOBULIN: 0.2 g/dL (ref 0.0–0.4)
ALPHA-2-GLOBULIN: 0.5 g/dL (ref 0.4–1.0)
Beta Globulin: 0.7 g/dL (ref 0.7–1.3)
GLOBULIN, TOTAL: 2.4 g/dL (ref 2.2–3.9)
Gamma Globulin: 0.9 g/dL (ref 0.4–1.8)
TOTAL PROTEIN ELP: 6.1 g/dL (ref 6.0–8.5)

## 2015-07-12 LAB — PLATELET ANTIBODY PROFILE, SERUM
HLA Ab Ser Ql EIA: NEGATIVE
IA/IIA Antibody: NEGATIVE
IB/IX Antibody: NEGATIVE
IIB/IIIA ANTIBODY: POSITIVE — AB

## 2015-07-12 LAB — ANA W/REFLEX: Anti Nuclear Antibody(ANA): NEGATIVE

## 2015-07-16 NOTE — Progress Notes (Signed)
[place]  [phone] [phone]  ID: Kaeson Kleinert OB: 08-Jan-1941  MR#: [id number]  BBC#:[id number]  Patient Care Team: Provider Default, MD as PCP - General  CHIEF COMPLAINT:  Chief Complaint  Patient presents with  . thrombocytopenia  . New Evaluation    INTERVAL HISTORY: Patient is a 75 year old male whose last evaluated in clinic in 2011. He is referred back for reevaluation of his persistent thrombocytopenia. He currently feels well and is asymptomatic. He denies any easy bleeding or bruising. He denies any recent fevers or illnesses. He has no neurologic complaints. He has a good appetite and denies weight loss. He denies any chest pain or shortness breath. He denies any nausea, vomiting, constipation, or diarrhea. He has no urinary complaints. Patient feels at his baseline and offers no specific complaints today.  REVIEW OF SYSTEMS:   Review of Systems  Constitutional: Negative.  Negative for fever, weight loss and malaise/fatigue.  Respiratory: Negative.  Negative for cough and shortness of breath.   Cardiovascular: Negative.  Negative for chest pain.  Gastrointestinal: Negative.  Negative for blood in stool and melena.  Musculoskeletal: Negative.   Neurological: Negative.  Negative for weakness.  Endo/Heme/Allergies: Does not bruise/bleed easily.  Psychiatric/Behavioral: Negative.     As per HPI. Otherwise, a complete review of systems is negatve.  PAST MEDICAL HISTORY: Past Medical History  Diagnosis Date  . Hypertension   . Hyperlipidemia   . Coronary artery disease   . Diabetes mellitus without complication (HCC)     DIET CONTROLLED   . GERD (gastroesophageal reflux disease)   . Chronic kidney disease     follwed by Nephrologist Dr Juanito Doom in [place], Stage 3  . Arthritis   . DDD (degenerative disc disease), cervical   . Frequent urination   . Gout     PAST SURGICAL HISTORY: Past Surgical History  Procedure  Laterality Date  . Appendectomy    . Colonoscopy with propofol N/A 08/21/2014    Procedure: COLONOSCOPY WITH PROPOFOL;  Surgeon: Hulen Luster, MD;  Location: [facility] ENDOSCOPY;  Service: Gastroenterology;  Laterality: N/A;  . Back surgery  1980'S  . Coronary artery bypass graft  1994  . Eye surgery Left     Cataract    FAMILY HISTORY: Reviewed and unchanged. No reported history of malignancy or chronic disease.     ADVANCED DIRECTIVES:    HEALTH MAINTENANCE: Social History  Substance Use Topics  . Smoking status: Former Smoker -- 30 years  . Smokeless tobacco: Never Used     Comment: quit in his 90's  . Alcohol Use: No     Colonoscopy:  PAP:  Bone density:  Lipid panel:  No Known Allergies  Current Outpatient Prescriptions  Medication Sig Dispense Refill  . amLODipine (NORVASC) 10 MG tablet Take 10 mg by mouth daily.    Marland Kitchen aspirin 81 MG tablet Take 81 mg by mouth daily.    Marland Kitchen atorvastatin (LIPITOR) 10 MG tablet Take 10 mg by mouth daily.    . colchicine 0.6 MG tablet Take 0.3 mg by mouth daily.     . furosemide (LASIX) 20 MG tablet Take 40 mg by mouth daily.    . hydrALAZINE (APRESOLINE) 100 MG tablet Take 100 mg by mouth 2 (two) times daily.    . isosorbide mononitrate (IMDUR) 120 MG 24 hr tablet Take 240 mg by mouth daily.     . metoprolol succinate (TOPROL-XL) 100 MG 24 hr tablet Take 200 mg by mouth daily.  Take with or immediately following a meal.    . nitroGLYCERIN (NITROSTAT) 0.4 MG SL tablet Place 0.4 mg under the tongue every 5 (five) minutes as needed for chest pain.    . potassium chloride SA (K-DUR,KLOR-CON) 20 MEQ tablet Take 20 mEq by mouth daily.    . ranolazine (RANEXA) 1000 MG SR tablet Take 500 mg by mouth 2 (two) times daily.    . tamsulosin (FLOMAX) 0.4 MG CAPS capsule Take 0.4 mg by mouth daily.     No current facility-administered medications for this visit.    OBJECTIVE: Filed Vitals:   07/11/15 1153  BP: 108/64  Pulse: 64  Temp: 96.7 F (35.9 C)    Resp: 16     There is no weight on file to calculate BMI.    ECOG FS:0 - Asymptomatic  General: Well-developed, well-nourished, no acute distress. Eyes: Pink conjunctiva, anicteric sclera. HEENT: Normocephalic, moist mucous membranes, clear oropharnyx. Lungs: Clear to auscultation bilaterally. Heart: Regular rate and rhythm. No rubs, murmurs, or gallops. Abdomen: Soft, nontender, nondistended. No organomegaly noted, normoactive bowel sounds. Musculoskeletal: No edema, cyanosis, or clubbing. Neuro: Alert, answering all questions appropriately. Cranial nerves grossly intact. Skin: No rashes or petechiae noted. Psych: Normal affect. Lymphatics: No cervical, calvicular, axillary or inguinal LAD.   LAB RESULTS:  Lab Results  Component Value Date   NA 136 06/22/2015   K 4.3 06/22/2015   CL 101 06/22/2015   CO2 26 06/22/2015   GLUCOSE 113* 06/22/2015   BUN 19 06/22/2015   CREATININE 2.24* 06/22/2015   CALCIUM 9.3 06/22/2015   GFRNONAA 27* 06/22/2015   GFRAA 31* 06/22/2015    Lab Results  Component Value Date   WBC 2.8* 07/11/2015   HGB 10.1* 07/11/2015   HCT 29.9* 07/11/2015   MCV 91.9 07/11/2015   PLT 74* 07/11/2015   Lab Results  Component Value Date   IRON 63 07/11/2015   TIBC 274 07/11/2015   IRONPCTSAT 23 07/11/2015    Lab Results  Component Value Date   FERRITIN 107 07/11/2015   Lab Results  Component Value Date   TOTALPROTELP 6.1 07/11/2015   ALBUMINELP 3.7 07/11/2015   A1GS 0.2 07/11/2015   A2GS 0.5 07/11/2015   BETS 0.7 07/11/2015   GAMS 0.9 07/11/2015   MSPIKE Not Observed 07/11/2015   SPEI Comment 07/11/2015     STUDIES: No results found.  ASSESSMENT: Pancytopenia.  PLAN:    1. Thrombocytopenia:  Patient's further count is decreased, but relatively stable. He was found to have positive platelet antibodies, but the remainder of his laboratory work is all either negative or within normal limits. No intervention is needed at this time. Patient  does not require bone marrow biopsy. If treatment is being considered, can try a short burst of prednisone to improve his platelet count. Return to clinic in 2 weeks for further evaluation and discussion of his laboratory results. 2. Anemia: Likely secondary to chronic renal insufficiency. Iron stores and the remainder of his laboratory work are within normal limits.  Patient's hemoglobin is greater than 10.7, but if he declines below 10.0 he may benefit from Procrit in the future. 3. Leukopenia: Mild, monitor. 4. Chronic renal insufficiency: Unclear patient's baseline, monitor.  Patient expressed understanding and was in agreement with this plan. He also understands that He can call clinic at any time with any questions, concerns, or complaints.    Lloyd Huger, MD   07/16/2015 9:31 AM

## 2015-07-18 DIAGNOSIS — I5032 Chronic diastolic (congestive) heart failure: Secondary | ICD-10-CM | POA: Insufficient documentation

## 2015-07-18 DIAGNOSIS — M4802 Spinal stenosis, cervical region: Secondary | ICD-10-CM | POA: Insufficient documentation

## 2015-07-25 ENCOUNTER — Inpatient Hospital Stay (HOSPITAL_BASED_OUTPATIENT_CLINIC_OR_DEPARTMENT_OTHER): Payer: Medicare HMO | Admitting: Oncology

## 2015-07-25 VITALS — BP 100/55 | HR 60 | Temp 95.3°F | Resp 16

## 2015-07-25 DIAGNOSIS — E1122 Type 2 diabetes mellitus with diabetic chronic kidney disease: Secondary | ICD-10-CM

## 2015-07-25 DIAGNOSIS — Z7982 Long term (current) use of aspirin: Secondary | ICD-10-CM

## 2015-07-25 DIAGNOSIS — N189 Chronic kidney disease, unspecified: Secondary | ICD-10-CM

## 2015-07-25 DIAGNOSIS — I129 Hypertensive chronic kidney disease with stage 1 through stage 4 chronic kidney disease, or unspecified chronic kidney disease: Secondary | ICD-10-CM

## 2015-07-25 DIAGNOSIS — D61818 Other pancytopenia: Secondary | ICD-10-CM | POA: Diagnosis not present

## 2015-07-25 DIAGNOSIS — M1990 Unspecified osteoarthritis, unspecified site: Secondary | ICD-10-CM

## 2015-07-25 DIAGNOSIS — E785 Hyperlipidemia, unspecified: Secondary | ICD-10-CM

## 2015-07-25 DIAGNOSIS — Z87891 Personal history of nicotine dependence: Secondary | ICD-10-CM

## 2015-07-25 DIAGNOSIS — K219 Gastro-esophageal reflux disease without esophagitis: Secondary | ICD-10-CM

## 2015-07-25 DIAGNOSIS — D696 Thrombocytopenia, unspecified: Secondary | ICD-10-CM

## 2015-07-25 DIAGNOSIS — R5383 Other fatigue: Secondary | ICD-10-CM | POA: Diagnosis not present

## 2015-07-25 DIAGNOSIS — I2510 Atherosclerotic heart disease of native coronary artery without angina pectoris: Secondary | ICD-10-CM

## 2015-07-25 DIAGNOSIS — Z79899 Other long term (current) drug therapy: Secondary | ICD-10-CM

## 2015-07-25 DIAGNOSIS — M109 Gout, unspecified: Secondary | ICD-10-CM

## 2015-07-25 MED ORDER — PREDNISONE 10 MG (21) PO TBPK: 10.0000 mg | ORAL_TABLET | Freq: Every day | ORAL | Status: DC

## 2015-07-25 NOTE — Progress Notes (Signed)
Patient is feeling fatigued. 

## 2015-07-31 ENCOUNTER — Other Ambulatory Visit: Payer: Self-pay | Admitting: *Deleted

## 2015-07-31 DIAGNOSIS — D696 Thrombocytopenia, unspecified: Secondary | ICD-10-CM

## 2015-08-01 ENCOUNTER — Inpatient Hospital Stay: Payer: Medicare HMO | Attending: Oncology

## 2015-08-01 DIAGNOSIS — N189 Chronic kidney disease, unspecified: Secondary | ICD-10-CM | POA: Diagnosis not present

## 2015-08-01 DIAGNOSIS — R531 Weakness: Secondary | ICD-10-CM | POA: Diagnosis not present

## 2015-08-01 DIAGNOSIS — I10 Essential (primary) hypertension: Secondary | ICD-10-CM | POA: Diagnosis not present

## 2015-08-01 DIAGNOSIS — I129 Hypertensive chronic kidney disease with stage 1 through stage 4 chronic kidney disease, or unspecified chronic kidney disease: Secondary | ICD-10-CM | POA: Insufficient documentation

## 2015-08-01 DIAGNOSIS — E785 Hyperlipidemia, unspecified: Secondary | ICD-10-CM | POA: Insufficient documentation

## 2015-08-01 DIAGNOSIS — E1122 Type 2 diabetes mellitus with diabetic chronic kidney disease: Secondary | ICD-10-CM | POA: Diagnosis not present

## 2015-08-01 DIAGNOSIS — Z79899 Other long term (current) drug therapy: Secondary | ICD-10-CM | POA: Diagnosis not present

## 2015-08-01 DIAGNOSIS — D649 Anemia, unspecified: Secondary | ICD-10-CM | POA: Insufficient documentation

## 2015-08-01 DIAGNOSIS — M1990 Unspecified osteoarthritis, unspecified site: Secondary | ICD-10-CM | POA: Diagnosis not present

## 2015-08-01 DIAGNOSIS — D693 Immune thrombocytopenic purpura: Secondary | ICD-10-CM | POA: Diagnosis present

## 2015-08-01 DIAGNOSIS — R0609 Other forms of dyspnea: Secondary | ICD-10-CM | POA: Insufficient documentation

## 2015-08-01 DIAGNOSIS — Z7982 Long term (current) use of aspirin: Secondary | ICD-10-CM | POA: Diagnosis not present

## 2015-08-01 DIAGNOSIS — R5381 Other malaise: Secondary | ICD-10-CM | POA: Diagnosis not present

## 2015-08-01 DIAGNOSIS — Z7952 Long term (current) use of systemic steroids: Secondary | ICD-10-CM | POA: Diagnosis not present

## 2015-08-01 DIAGNOSIS — R5383 Other fatigue: Secondary | ICD-10-CM | POA: Insufficient documentation

## 2015-08-01 DIAGNOSIS — D696 Thrombocytopenia, unspecified: Secondary | ICD-10-CM

## 2015-08-01 DIAGNOSIS — Z87891 Personal history of nicotine dependence: Secondary | ICD-10-CM | POA: Diagnosis not present

## 2015-08-01 DIAGNOSIS — D72819 Decreased white blood cell count, unspecified: Secondary | ICD-10-CM | POA: Insufficient documentation

## 2015-08-01 DIAGNOSIS — K219 Gastro-esophageal reflux disease without esophagitis: Secondary | ICD-10-CM | POA: Diagnosis not present

## 2015-08-01 LAB — CBC WITH DIFFERENTIAL/PLATELET
Basophils Absolute: 0 10*3/uL (ref 0–0.1)
Basophils Relative: 1 %
EOS PCT: 2 %
Eosinophils Absolute: 0.1 10*3/uL (ref 0–0.7)
HCT: 31.4 % — ABNORMAL LOW (ref 40.0–52.0)
Hemoglobin: 10.7 g/dL — ABNORMAL LOW (ref 13.0–18.0)
LYMPHS ABS: 0.5 10*3/uL — AB (ref 1.0–3.6)
LYMPHS PCT: 13 %
MCH: 31.6 pg (ref 26.0–34.0)
MCHC: 33.9 g/dL (ref 32.0–36.0)
MCV: 93.1 fL (ref 80.0–100.0)
MONOS PCT: 11 %
Monocytes Absolute: 0.4 10*3/uL (ref 0.2–1.0)
Neutro Abs: 2.7 10*3/uL (ref 1.4–6.5)
Neutrophils Relative %: 73 %
PLATELETS: 76 10*3/uL — AB (ref 150–440)
RBC: 3.38 MIL/uL — ABNORMAL LOW (ref 4.40–5.90)
RDW: 15.4 % — AB (ref 11.5–14.5)
WBC: 3.7 10*3/uL — ABNORMAL LOW (ref 3.8–10.6)

## 2015-08-01 MED ORDER — PREDNISONE 20 MG PO TABS: 80.0000 mg | ORAL_TABLET | Freq: Every day | ORAL | Status: DC

## 2015-08-01 NOTE — Progress Notes (Signed)
[place]  Telephone:(336) [phone] Fax:(336) [phone]  ID: Robert Parks OB: June 19, 1940  MR#: [id number]  HBZ#:[id number]  Patient Care Team: Provider Default, MD as PCP - General  CHIEF COMPLAINT:  Chief Complaint  Patient presents with  . thrombocytopenia    INTERVAL HISTORY: Patient returns to clinic today for repeat laboratory work and further evaluation. He feels increased fatigue today, but otherwise feels well. He denies any easy bleeding or bruising. He denies any recent fevers or illnesses. He has no neurologic complaints. He has a good appetite and denies weight loss. He denies any chest pain or shortness breath. He denies any nausea, vomiting, constipation, or diarrhea. He has no urinary complaints. Patient offers no further specific complaints today.  REVIEW OF SYSTEMS:   Review of Systems  Constitutional: Negative.  Negative for fever, weight loss and malaise/fatigue.  Respiratory: Negative.  Negative for cough and shortness of breath.   Cardiovascular: Negative.  Negative for chest pain.  Gastrointestinal: Negative.  Negative for blood in stool and melena.  Musculoskeletal: Negative.   Neurological: Negative.  Negative for weakness.  Endo/Heme/Allergies: Does not bruise/bleed easily.  Psychiatric/Behavioral: Negative.     As per HPI. Otherwise, a complete review of systems is negatve.  PAST MEDICAL HISTORY: Past Medical History  Diagnosis Date  . Hypertension   . Hyperlipidemia   . Coronary artery disease   . Diabetes mellitus without complication (HCC)     DIET CONTROLLED   . GERD (gastroesophageal reflux disease)   . Chronic kidney disease     follwed by Nephrologist Dr Juanito Doom in [place], Stage 3  . Arthritis   . DDD (degenerative disc disease), cervical   . Frequent urination   . Gout     PAST SURGICAL HISTORY: Past Surgical History  Procedure Laterality Date  . Appendectomy    . Colonoscopy with propofol N/A 08/21/2014   Procedure: COLONOSCOPY WITH PROPOFOL;  Surgeon: Hulen Luster, MD;  Location: [facility] ENDOSCOPY;  Service: Gastroenterology;  Laterality: N/A;  . Back surgery  1980'S  . Coronary artery bypass graft  1994  . Eye surgery Left     Cataract    FAMILY HISTORY: Reviewed and unchanged. No reported history of malignancy or chronic disease.     ADVANCED DIRECTIVES:    HEALTH MAINTENANCE: Social History  Substance Use Topics  . Smoking status: Former Smoker -- 30 years  . Smokeless tobacco: Never Used     Comment: quit in his 24's  . Alcohol Use: No     Colonoscopy:  PAP:  Bone density:  Lipid panel:  No Known Allergies  Current Outpatient Prescriptions  Medication Sig Dispense Refill  . amLODipine (NORVASC) 10 MG tablet Take 10 mg by mouth daily.    Marland Kitchen aspirin 81 MG tablet Take 81 mg by mouth daily.    Marland Kitchen atorvastatin (LIPITOR) 10 MG tablet Take 10 mg by mouth daily.    . colchicine 0.6 MG tablet Take 0.3 mg by mouth daily.     . furosemide (LASIX) 20 MG tablet Take 40 mg by mouth daily.    . hydrALAZINE (APRESOLINE) 100 MG tablet Take 100 mg by mouth 2 (two) times daily.    . isosorbide mononitrate (IMDUR) 120 MG 24 hr tablet Take 240 mg by mouth daily.     . metoprolol succinate (TOPROL-XL) 100 MG 24 hr tablet Take 200 mg by mouth daily. Take with or immediately following a meal.    . nitroGLYCERIN (NITROSTAT) 0.4 MG SL tablet Place  0.4 mg under the tongue every 5 (five) minutes as needed for chest pain.    . potassium chloride SA (K-DUR,KLOR-CON) 20 MEQ tablet Take 20 mEq by mouth daily.    . ranolazine (RANEXA) 1000 MG SR tablet Take 500 mg by mouth 2 (two) times daily.    . tamsulosin (FLOMAX) 0.4 MG CAPS capsule Take 0.4 mg by mouth daily.    . predniSONE (STERAPRED UNI-PAK 21 TAB) 10 MG (21) TBPK tablet Take 1 tablet (10 mg total) by mouth daily. Taper as directed 21 tablet 0   No current facility-administered medications for this visit.    OBJECTIVE: Filed Vitals:   07/25/15  1042  BP: 100/55  Pulse: 60  Temp: 95.3 F (35.2 C)  Resp: 16     There is no weight on file to calculate BMI.    ECOG FS:0 - Asymptomatic  General: Well-developed, well-nourished, no acute distress. Eyes: Pink conjunctiva, anicteric sclera. Lungs: Clear to auscultation bilaterally. Heart: Regular rate and rhythm. No rubs, murmurs, or gallops. Abdomen: Soft, nontender, nondistended. No organomegaly noted, normoactive bowel sounds. Musculoskeletal: No edema, cyanosis, or clubbing. Neuro: Alert, answering all questions appropriately. Cranial nerves grossly intact. Skin: No rashes or petechiae noted. Psych: Normal affect.   LAB RESULTS:  Lab Results  Component Value Date   NA 136 06/22/2015   K 4.3 06/22/2015   CL 101 06/22/2015   CO2 26 06/22/2015   GLUCOSE 113* 06/22/2015   BUN 19 06/22/2015   CREATININE 2.24* 06/22/2015   CALCIUM 9.3 06/22/2015   GFRNONAA 27* 06/22/2015   GFRAA 31* 06/22/2015    Lab Results  Component Value Date   WBC 2.8* 07/11/2015   HGB 10.1* 07/11/2015   HCT 29.9* 07/11/2015   MCV 91.9 07/11/2015   PLT 74* 07/11/2015   Lab Results  Component Value Date   IRON 63 07/11/2015   TIBC 274 07/11/2015   IRONPCTSAT 23 07/11/2015    Lab Results  Component Value Date   FERRITIN 107 07/11/2015   Lab Results  Component Value Date   TOTALPROTELP 6.1 07/11/2015   ALBUMINELP 3.7 07/11/2015   A1GS 0.2 07/11/2015   A2GS 0.5 07/11/2015   BETS 0.7 07/11/2015   GAMS 0.9 07/11/2015   MSPIKE Not Observed 07/11/2015   SPEI Comment 07/11/2015     STUDIES: No results found.  ASSESSMENT: Pancytopenia.  PLAN:    1. Thrombocytopenia:  Patient's platelet count has declined slightly from 88-74. He is also noted to have positive platelet antibodies, but the remainder of his laboratory work is all either negative or within normal limits. No intervention is needed at this time. Patient does not require bone marrow biopsy, but may require one in the  future. Because patient is awaiting count to improve to have neck surgery, he was given a prednisone burst to see if his platelet count can improve over 100. Return to clinic in 1 week for laboratory work at which point we will discuss further follow-up and coordination with surgery if platelet count improves. 2. Anemia: Likely secondary to chronic renal insufficiency. Iron stores and the remainder of his laboratory work are within normal limits.  Patient's hemoglobin is greater than 10.1, but if he declines below 10.0 he may benefit from Procrit in the future. 3. Leukopenia: Mild, monitor. 4. Chronic renal insufficiency: Unclear patient's baseline, monitor.  Patient expressed understanding and was in agreement with this plan. He also understands that He can call clinic at any time with any questions, concerns, or complaints.  Lloyd Huger, MD   08/01/2015 8:49 AM

## 2015-08-06 ENCOUNTER — Other Ambulatory Visit: Payer: Self-pay | Admitting: Oncology

## 2015-08-06 DIAGNOSIS — D693 Immune thrombocytopenic purpura: Secondary | ICD-10-CM | POA: Insufficient documentation

## 2015-08-08 ENCOUNTER — Inpatient Hospital Stay (HOSPITAL_BASED_OUTPATIENT_CLINIC_OR_DEPARTMENT_OTHER): Payer: Medicare HMO | Admitting: Oncology

## 2015-08-08 ENCOUNTER — Inpatient Hospital Stay: Payer: Medicare HMO

## 2015-08-08 VITALS — BP 114/66 | HR 62 | Temp 95.6°F | Resp 18 | Wt 167.8 lb

## 2015-08-08 DIAGNOSIS — D61818 Other pancytopenia: Secondary | ICD-10-CM

## 2015-08-08 DIAGNOSIS — I129 Hypertensive chronic kidney disease with stage 1 through stage 4 chronic kidney disease, or unspecified chronic kidney disease: Secondary | ICD-10-CM

## 2015-08-08 DIAGNOSIS — Z7952 Long term (current) use of systemic steroids: Secondary | ICD-10-CM

## 2015-08-08 DIAGNOSIS — E1122 Type 2 diabetes mellitus with diabetic chronic kidney disease: Secondary | ICD-10-CM | POA: Diagnosis not present

## 2015-08-08 DIAGNOSIS — Z79899 Other long term (current) drug therapy: Secondary | ICD-10-CM

## 2015-08-08 DIAGNOSIS — N189 Chronic kidney disease, unspecified: Secondary | ICD-10-CM

## 2015-08-08 DIAGNOSIS — E785 Hyperlipidemia, unspecified: Secondary | ICD-10-CM

## 2015-08-08 DIAGNOSIS — I10 Essential (primary) hypertension: Secondary | ICD-10-CM

## 2015-08-08 DIAGNOSIS — R5383 Other fatigue: Secondary | ICD-10-CM

## 2015-08-08 DIAGNOSIS — R0609 Other forms of dyspnea: Secondary | ICD-10-CM

## 2015-08-08 DIAGNOSIS — D693 Immune thrombocytopenic purpura: Secondary | ICD-10-CM | POA: Diagnosis not present

## 2015-08-08 DIAGNOSIS — Z7982 Long term (current) use of aspirin: Secondary | ICD-10-CM

## 2015-08-08 DIAGNOSIS — Z87891 Personal history of nicotine dependence: Secondary | ICD-10-CM

## 2015-08-08 DIAGNOSIS — R5381 Other malaise: Secondary | ICD-10-CM

## 2015-08-08 DIAGNOSIS — R531 Weakness: Secondary | ICD-10-CM

## 2015-08-08 DIAGNOSIS — K219 Gastro-esophageal reflux disease without esophagitis: Secondary | ICD-10-CM

## 2015-08-08 LAB — CBC WITH DIFFERENTIAL/PLATELET
BASOS PCT: 0 %
Basophils Absolute: 0 10*3/uL (ref 0–0.1)
EOS ABS: 0 10*3/uL (ref 0–0.7)
EOS PCT: 0 %
HCT: 31.4 % — ABNORMAL LOW (ref 40.0–52.0)
HEMOGLOBIN: 10.8 g/dL — AB (ref 13.0–18.0)
Lymphocytes Relative: 6 %
Lymphs Abs: 0.5 10*3/uL — ABNORMAL LOW (ref 1.0–3.6)
MCH: 32.1 pg (ref 26.0–34.0)
MCHC: 34.5 g/dL (ref 32.0–36.0)
MCV: 93 fL (ref 80.0–100.0)
MONO ABS: 0.7 10*3/uL (ref 0.2–1.0)
MONOS PCT: 8 %
NEUTROS PCT: 86 %
Neutro Abs: 7.4 10*3/uL — ABNORMAL HIGH (ref 1.4–6.5)
PLATELETS: 70 10*3/uL — AB (ref 150–440)
RBC: 3.37 MIL/uL — ABNORMAL LOW (ref 4.40–5.90)
RDW: 15.6 % — AB (ref 11.5–14.5)
WBC: 8.6 10*3/uL (ref 3.8–10.6)

## 2015-08-08 NOTE — Progress Notes (Signed)
States is feeling weak today with shortness of breath on exertion.

## 2015-08-08 NOTE — Progress Notes (Signed)
[place]  [phone] [phone]  ID: Robert Parks OB: 1940/08/19  MR#: [id number]  WIO#:[id number]  Patient Care Team: Provider Default, MD as PCP - General  CHIEF COMPLAINT:  Chief Complaint  Patient presents with  . thrombocytopenia    INTERVAL HISTORY: Patient returns to clinic today for repeat laboratory work and consideration of Rituxan infusion. He continues to feel weak and fatigued today. He also has occasional dyspnea on exertion. He denies any easy bleeding or bruising. He denies any recent fevers or illnesses. He has no neurologic complaints. He has a good appetite and denies weight loss. He denies any chest pain. He denies any nausea, vomiting, constipation, or diarrhea. He has no urinary complaints. Patient offers no further specific complaints today.  REVIEW OF SYSTEMS:   Review of Systems  Constitutional: Positive for malaise/fatigue. Negative for fever and weight loss.  Respiratory: Positive for shortness of breath. Negative for cough.   Cardiovascular: Negative.  Negative for chest pain.  Gastrointestinal: Negative.  Negative for blood in stool and melena.  Musculoskeletal: Negative.   Neurological: Positive for weakness.  Endo/Heme/Allergies: Does not bruise/bleed easily.  Psychiatric/Behavioral: Negative.     As per HPI. Otherwise, a complete review of systems is negatve.  PAST MEDICAL HISTORY: Past Medical History  Diagnosis Date  . Hypertension   . Hyperlipidemia   . Coronary artery disease   . Diabetes mellitus without complication (HCC)     DIET CONTROLLED   . GERD (gastroesophageal reflux disease)   . Chronic kidney disease     follwed by Nephrologist Dr Juanito Doom in [place], Stage 3  . Arthritis   . DDD (degenerative disc disease), cervical   . Frequent urination   . Gout     PAST SURGICAL HISTORY: Past Surgical History  Procedure Laterality Date  . Appendectomy    . Colonoscopy with propofol  N/A 08/21/2014    Procedure: COLONOSCOPY WITH PROPOFOL;  Surgeon: Hulen Luster, MD;  Location: [facility] ENDOSCOPY;  Service: Gastroenterology;  Laterality: N/A;  . Back surgery  1980'S  . Coronary artery bypass graft  1994  . Eye surgery Left     Cataract    FAMILY HISTORY: Reviewed and unchanged. No reported history of malignancy or chronic disease.     ADVANCED DIRECTIVES:    HEALTH MAINTENANCE: Social History  Substance Use Topics  . Smoking status: Former Smoker -- 30 years  . Smokeless tobacco: Never Used     Comment: quit in his 83's  . Alcohol Use: No     Colonoscopy:  PAP:  Bone density:  Lipid panel:  No Known Allergies  Current Outpatient Prescriptions  Medication Sig Dispense Refill  . amLODipine (NORVASC) 10 MG tablet Take 10 mg by mouth daily.    Marland Kitchen aspirin 81 MG tablet Take 81 mg by mouth daily.    Marland Kitchen atorvastatin (LIPITOR) 10 MG tablet Take 10 mg by mouth daily.    . colchicine 0.6 MG tablet Take 0.3 mg by mouth daily.     . furosemide (LASIX) 20 MG tablet Take 40 mg by mouth daily.    . hydrALAZINE (APRESOLINE) 100 MG tablet Take 100 mg by mouth 2 (two) times daily.    . isosorbide mononitrate (IMDUR) 120 MG 24 hr tablet Take 240 mg by mouth daily.     . metoprolol succinate (TOPROL-XL) 100 MG 24 hr tablet Take 200 mg by mouth daily. Take with or immediately following a meal.    . nitroGLYCERIN (NITROSTAT) 0.4  MG SL tablet Place 0.4 mg under the tongue every 5 (five) minutes as needed for chest pain.    . potassium chloride SA (K-DUR,KLOR-CON) 20 MEQ tablet Take 20 mEq by mouth daily.    . predniSONE (DELTASONE) 20 MG tablet Take 4 tablets (80 mg total) by mouth daily with breakfast. 28 tablet 0  . ranolazine (RANEXA) 1000 MG SR tablet Take 500 mg by mouth 2 (two) times daily.    . tamsulosin (FLOMAX) 0.4 MG CAPS capsule Take 0.4 mg by mouth daily.     No current facility-administered medications for this visit.    OBJECTIVE: Filed Vitals:   08/08/15 0928    BP: 114/66  Pulse: 62  Temp: 95.6 F (35.3 C)  Resp: 18     Body mass index is 25.52 kg/(m^2).    ECOG FS:0 - Asymptomatic  General: Well-developed, well-nourished, no acute distress. Eyes: Pink conjunctiva, anicteric sclera. Lungs: Clear to auscultation bilaterally. Heart: Regular rate and rhythm. No rubs, murmurs, or gallops. Abdomen: Soft, nontender, nondistended. No organomegaly noted, normoactive bowel sounds. Musculoskeletal: No edema, cyanosis, or clubbing. Neuro: Alert, answering all questions appropriately. Cranial nerves grossly intact. Skin: No rashes or petechiae noted. Psych: Normal affect.   LAB RESULTS:  Lab Results  Component Value Date   NA 136 06/22/2015   K 4.3 06/22/2015   CL 101 06/22/2015   CO2 26 06/22/2015   GLUCOSE 113* 06/22/2015   BUN 19 06/22/2015   CREATININE 2.24* 06/22/2015   CALCIUM 9.3 06/22/2015   GFRNONAA 27* 06/22/2015   GFRAA 31* 06/22/2015    Lab Results  Component Value Date   WBC 8.6 08/08/2015   NEUTROABS 7.4* 08/08/2015   HGB 10.8* 08/08/2015   HCT 31.4* 08/08/2015   MCV 93.0 08/08/2015   PLT 70* 08/08/2015   Lab Results  Component Value Date   IRON 63 07/11/2015   TIBC 274 07/11/2015   IRONPCTSAT 23 07/11/2015    Lab Results  Component Value Date   FERRITIN 107 07/11/2015   Lab Results  Component Value Date   TOTALPROTELP 6.1 07/11/2015   ALBUMINELP 3.7 07/11/2015   A1GS 0.2 07/11/2015   A2GS 0.5 07/11/2015   BETS 0.7 07/11/2015   GAMS 0.9 07/11/2015   MSPIKE Not Observed 07/11/2015   SPEI Comment 07/11/2015     STUDIES: No results found.  ASSESSMENT: Pancytopenia.  PLAN:    1. Thrombocytopenia:  Patient's platelet count continues to be decreased and essentially unchanged despite receiving high-dose prednisone. He can be considered a prednisone failure, therefore will proceed with second line treatment using weekly Rituxan 375 mg/m 4 weeks. Goal platelet count will be greater than 100 in order to  proceed with his neck surgery. If Rituxan does not work, will give patient Nplate as third line therapy. Patient does not require bone marrow biopsy, but may require one in the future. 2. Anemia: Likely secondary to chronic renal insufficiency. Iron stores and the remainder of his laboratory work are within normal limits.  If patient's hemoglobin declines below 10.0, he may benefit from Procrit in the future. 3. Leukopenia: Resolved.  4. Chronic renal insufficiency: Unclear patient's baseline, monitor.  Patient expressed understanding and was in agreement with this plan. He also understands that He can call clinic at any time with any questions, concerns, or complaints.    Lloyd Huger, MD   08/08/2015 9:48 AM

## 2015-08-15 ENCOUNTER — Other Ambulatory Visit: Payer: Self-pay | Admitting: Internal Medicine

## 2015-08-16 ENCOUNTER — Inpatient Hospital Stay: Payer: Medicare HMO

## 2015-08-16 ENCOUNTER — Inpatient Hospital Stay (HOSPITAL_BASED_OUTPATIENT_CLINIC_OR_DEPARTMENT_OTHER): Payer: Medicare HMO | Admitting: Oncology

## 2015-08-16 VITALS — BP 112/62 | HR 71 | Temp 97.4°F | Resp 18 | Wt 159.9 lb

## 2015-08-16 DIAGNOSIS — Z79899 Other long term (current) drug therapy: Secondary | ICD-10-CM

## 2015-08-16 DIAGNOSIS — I129 Hypertensive chronic kidney disease with stage 1 through stage 4 chronic kidney disease, or unspecified chronic kidney disease: Secondary | ICD-10-CM

## 2015-08-16 DIAGNOSIS — Z7982 Long term (current) use of aspirin: Secondary | ICD-10-CM

## 2015-08-16 DIAGNOSIS — E1122 Type 2 diabetes mellitus with diabetic chronic kidney disease: Secondary | ICD-10-CM

## 2015-08-16 DIAGNOSIS — D693 Immune thrombocytopenic purpura: Secondary | ICD-10-CM

## 2015-08-16 DIAGNOSIS — R5381 Other malaise: Secondary | ICD-10-CM

## 2015-08-16 DIAGNOSIS — Z7952 Long term (current) use of systemic steroids: Secondary | ICD-10-CM

## 2015-08-16 DIAGNOSIS — N189 Chronic kidney disease, unspecified: Secondary | ICD-10-CM

## 2015-08-16 DIAGNOSIS — M1990 Unspecified osteoarthritis, unspecified site: Secondary | ICD-10-CM

## 2015-08-16 DIAGNOSIS — D61818 Other pancytopenia: Secondary | ICD-10-CM | POA: Diagnosis not present

## 2015-08-16 DIAGNOSIS — R531 Weakness: Secondary | ICD-10-CM

## 2015-08-16 DIAGNOSIS — Z87891 Personal history of nicotine dependence: Secondary | ICD-10-CM

## 2015-08-16 DIAGNOSIS — E785 Hyperlipidemia, unspecified: Secondary | ICD-10-CM

## 2015-08-16 DIAGNOSIS — K219 Gastro-esophageal reflux disease without esophagitis: Secondary | ICD-10-CM

## 2015-08-16 DIAGNOSIS — I10 Essential (primary) hypertension: Secondary | ICD-10-CM

## 2015-08-16 DIAGNOSIS — R0609 Other forms of dyspnea: Secondary | ICD-10-CM

## 2015-08-16 DIAGNOSIS — R5383 Other fatigue: Secondary | ICD-10-CM

## 2015-08-16 LAB — CBC WITH DIFFERENTIAL/PLATELET
BASOS ABS: 0 10*3/uL (ref 0–0.1)
BASOS PCT: 0 %
EOS ABS: 0.1 10*3/uL (ref 0–0.7)
Eosinophils Relative: 2 %
HEMATOCRIT: 31.6 % — AB (ref 40.0–52.0)
HEMOGLOBIN: 10.7 g/dL — AB (ref 13.0–18.0)
Lymphocytes Relative: 12 %
Lymphs Abs: 0.6 10*3/uL — ABNORMAL LOW (ref 1.0–3.6)
MCH: 31.7 pg (ref 26.0–34.0)
MCHC: 33.8 g/dL (ref 32.0–36.0)
MCV: 93.6 fL (ref 80.0–100.0)
MONOS PCT: 8 %
Monocytes Absolute: 0.4 10*3/uL (ref 0.2–1.0)
NEUTROS ABS: 3.7 10*3/uL (ref 1.4–6.5)
NEUTROS PCT: 78 %
Platelets: 53 10*3/uL — ABNORMAL LOW (ref 150–440)
RBC: 3.37 MIL/uL — ABNORMAL LOW (ref 4.40–5.90)
RDW: 15.2 % — AB (ref 11.5–14.5)
WBC: 4.8 10*3/uL (ref 3.8–10.6)

## 2015-08-16 MED ORDER — DIPHENHYDRAMINE HCL 25 MG PO CAPS
25.0000 mg | ORAL_CAPSULE | Freq: Once | ORAL | Status: AC
  Administered 2015-08-16: 25 mg via ORAL
  Filled 2015-08-16: qty 1

## 2015-08-16 MED ORDER — SODIUM CHLORIDE 0.9 % IV SOLN
Freq: Once | INTRAVENOUS | Status: AC
  Administered 2015-08-16: 11:00:00 via INTRAVENOUS
  Filled 2015-08-16: qty 1000

## 2015-08-16 MED ORDER — SODIUM CHLORIDE 0.9 % IV SOLN
375.0000 mg/m2 | Freq: Once | INTRAVENOUS | Status: AC
  Administered 2015-08-16: 700 mg via INTRAVENOUS
  Filled 2015-08-16: qty 60

## 2015-08-16 MED ORDER — ACETAMINOPHEN 325 MG PO TABS
650.0000 mg | ORAL_TABLET | Freq: Once | ORAL | Status: AC
  Administered 2015-08-16: 650 mg via ORAL
  Filled 2015-08-16: qty 2

## 2015-08-17 LAB — HEPATITIS B SURFACE ANTIBODY, QUANTITATIVE

## 2015-08-17 LAB — HEPATITIS B SURFACE ANTIGEN: HEP B S AG: NEGATIVE

## 2015-08-23 ENCOUNTER — Inpatient Hospital Stay: Payer: Medicare HMO

## 2015-08-23 ENCOUNTER — Inpatient Hospital Stay (HOSPITAL_BASED_OUTPATIENT_CLINIC_OR_DEPARTMENT_OTHER): Payer: Medicare HMO | Admitting: Oncology

## 2015-08-23 VITALS — BP 116/70 | HR 78 | Temp 96.7°F | Resp 18 | Wt 159.4 lb

## 2015-08-23 DIAGNOSIS — Z79899 Other long term (current) drug therapy: Secondary | ICD-10-CM

## 2015-08-23 DIAGNOSIS — D693 Immune thrombocytopenic purpura: Secondary | ICD-10-CM

## 2015-08-23 DIAGNOSIS — K219 Gastro-esophageal reflux disease without esophagitis: Secondary | ICD-10-CM

## 2015-08-23 DIAGNOSIS — R0609 Other forms of dyspnea: Secondary | ICD-10-CM

## 2015-08-23 DIAGNOSIS — R5381 Other malaise: Secondary | ICD-10-CM

## 2015-08-23 DIAGNOSIS — Z87891 Personal history of nicotine dependence: Secondary | ICD-10-CM

## 2015-08-23 DIAGNOSIS — I129 Hypertensive chronic kidney disease with stage 1 through stage 4 chronic kidney disease, or unspecified chronic kidney disease: Secondary | ICD-10-CM

## 2015-08-23 DIAGNOSIS — Z7982 Long term (current) use of aspirin: Secondary | ICD-10-CM

## 2015-08-23 DIAGNOSIS — D61818 Other pancytopenia: Secondary | ICD-10-CM

## 2015-08-23 DIAGNOSIS — R531 Weakness: Secondary | ICD-10-CM

## 2015-08-23 DIAGNOSIS — E1122 Type 2 diabetes mellitus with diabetic chronic kidney disease: Secondary | ICD-10-CM

## 2015-08-23 DIAGNOSIS — Z7952 Long term (current) use of systemic steroids: Secondary | ICD-10-CM

## 2015-08-23 DIAGNOSIS — I10 Essential (primary) hypertension: Secondary | ICD-10-CM

## 2015-08-23 DIAGNOSIS — N189 Chronic kidney disease, unspecified: Secondary | ICD-10-CM | POA: Diagnosis not present

## 2015-08-23 DIAGNOSIS — R5383 Other fatigue: Secondary | ICD-10-CM

## 2015-08-23 DIAGNOSIS — E785 Hyperlipidemia, unspecified: Secondary | ICD-10-CM

## 2015-08-23 DIAGNOSIS — M1990 Unspecified osteoarthritis, unspecified site: Secondary | ICD-10-CM

## 2015-08-23 LAB — CBC WITH DIFFERENTIAL/PLATELET
BASOS ABS: 0 10*3/uL (ref 0–0.1)
BASOS PCT: 1 %
Eosinophils Absolute: 0 10*3/uL (ref 0–0.7)
Eosinophils Relative: 1 %
HEMATOCRIT: 28.9 % — AB (ref 40.0–52.0)
HEMOGLOBIN: 10 g/dL — AB (ref 13.0–18.0)
LYMPHS PCT: 12 %
Lymphs Abs: 0.4 10*3/uL — ABNORMAL LOW (ref 1.0–3.6)
MCH: 32.1 pg (ref 26.0–34.0)
MCHC: 34.5 g/dL (ref 32.0–36.0)
MCV: 93.2 fL (ref 80.0–100.0)
MONO ABS: 0.4 10*3/uL (ref 0.2–1.0)
MONOS PCT: 11 %
NEUTROS ABS: 2.7 10*3/uL (ref 1.4–6.5)
NEUTROS PCT: 75 %
Platelets: 65 10*3/uL — ABNORMAL LOW (ref 150–440)
RBC: 3.1 MIL/uL — ABNORMAL LOW (ref 4.40–5.90)
RDW: 14.9 % — ABNORMAL HIGH (ref 11.5–14.5)
WBC: 3.6 10*3/uL — ABNORMAL LOW (ref 3.8–10.6)

## 2015-08-23 MED ORDER — SODIUM CHLORIDE 0.9 % IV SOLN: 375.0000 mg/m2 | Freq: Once | INTRAVENOUS | Status: DC

## 2015-08-23 MED ORDER — DIPHENHYDRAMINE HCL 25 MG PO CAPS
25.0000 mg | ORAL_CAPSULE | Freq: Once | ORAL | Status: AC
  Administered 2015-08-23: 25 mg via ORAL
  Filled 2015-08-23: qty 1

## 2015-08-23 MED ORDER — SODIUM CHLORIDE 0.9 % IV SOLN
375.0000 mg/m2 | Freq: Once | INTRAVENOUS | Status: AC
  Administered 2015-08-23: 700 mg via INTRAVENOUS
  Filled 2015-08-23: qty 60

## 2015-08-23 MED ORDER — SODIUM CHLORIDE 0.9 % IV SOLN
Freq: Once | INTRAVENOUS | Status: AC
  Administered 2015-08-23: 10:00:00 via INTRAVENOUS
  Filled 2015-08-23: qty 1000

## 2015-08-23 MED ORDER — ACETAMINOPHEN 325 MG PO TABS
650.0000 mg | ORAL_TABLET | Freq: Once | ORAL | Status: AC
  Administered 2015-08-23: 650 mg via ORAL
  Filled 2015-08-23: qty 2

## 2015-08-23 NOTE — Progress Notes (Signed)
States is feeling weak today.

## 2015-08-25 NOTE — Progress Notes (Signed)
[place]  [phone] [phone]  ID: Robert Parks OB: December 19, 1940  MR#: [id number]  VFI#:[id number]  Patient Care Team: Provider Default, MD as PCP - General  CHIEF COMPLAINT:  Chief Complaint  Patient presents with  . ITP    INTERVAL HISTORY: Patient returns to clinic today for further evaluation and consideration of cycle 2 of 4 of weekly Rituxan. He continues to feel weak and fatigued today. He also has occasional dyspnea on exertion. He denies any easy bleeding or bruising. He denies any recent fevers or illnesses. He has no neurologic complaints. He has a good appetite and denies weight loss. He denies any chest pain. He denies any nausea, vomiting, constipation, or diarrhea. He has no urinary complaints. Patient offers no further specific complaints today.  REVIEW OF SYSTEMS:   Review of Systems  Constitutional: Positive for malaise/fatigue. Negative for fever and weight loss.  Respiratory: Positive for shortness of breath. Negative for cough.   Cardiovascular: Negative.  Negative for chest pain.  Gastrointestinal: Negative.  Negative for blood in stool and melena.  Musculoskeletal: Positive for neck pain.  Neurological: Positive for weakness.  Endo/Heme/Allergies: Does not bruise/bleed easily.  Psychiatric/Behavioral: Negative.     As per HPI. Otherwise, a complete review of systems is negatve.  PAST MEDICAL HISTORY: Past Medical History  Diagnosis Date  . Hypertension   . Hyperlipidemia   . Coronary artery disease   . Diabetes mellitus without complication (HCC)     DIET CONTROLLED   . GERD (gastroesophageal reflux disease)   . Chronic kidney disease     follwed by Nephrologist Dr Juanito Doom in [place], Stage 3  . Arthritis   . DDD (degenerative disc disease), cervical   . Frequent urination   . Gout     PAST SURGICAL HISTORY: Past Surgical History  Procedure Laterality Date  . Appendectomy    . Colonoscopy with  propofol N/A 08/21/2014    Procedure: COLONOSCOPY WITH PROPOFOL;  Surgeon: Hulen Luster, MD;  Location: [facility] ENDOSCOPY;  Service: Gastroenterology;  Laterality: N/A;  . Back surgery  1980'S  . Coronary artery bypass graft  1994  . Eye surgery Left     Cataract    FAMILY HISTORY: Reviewed and unchanged. No reported history of malignancy or chronic disease.     ADVANCED DIRECTIVES:    HEALTH MAINTENANCE: Social History  Substance Use Topics  . Smoking status: Former Smoker -- 30 years  . Smokeless tobacco: Never Used     Comment: quit in his 3's  . Alcohol Use: No     Colonoscopy:  PAP:  Bone density:  Lipid panel:  No Known Allergies  Current Outpatient Prescriptions  Medication Sig Dispense Refill  . amLODipine (NORVASC) 10 MG tablet Take 10 mg by mouth daily.    Marland Kitchen aspirin 81 MG tablet Take 81 mg by mouth daily.    Marland Kitchen atorvastatin (LIPITOR) 10 MG tablet Take 10 mg by mouth daily.    . colchicine 0.6 MG tablet Take 0.3 mg by mouth daily.     . furosemide (LASIX) 20 MG tablet Take 40 mg by mouth daily.    . hydrALAZINE (APRESOLINE) 100 MG tablet Take 100 mg by mouth 2 (two) times daily.    . isosorbide mononitrate (IMDUR) 120 MG 24 hr tablet Take 240 mg by mouth daily.     . metoprolol succinate (TOPROL-XL) 100 MG 24 hr tablet Take 200 mg by mouth daily. Take with or immediately following a meal.    .  nitroGLYCERIN (NITROSTAT) 0.4 MG SL tablet Place 0.4 mg under the tongue every 5 (five) minutes as needed for chest pain.    . potassium chloride SA (K-DUR,KLOR-CON) 20 MEQ tablet Take 20 mEq by mouth daily.    . ranolazine (RANEXA) 1000 MG SR tablet Take 500 mg by mouth 2 (two) times daily.    . tamsulosin (FLOMAX) 0.4 MG CAPS capsule Take 0.4 mg by mouth daily.     No current facility-administered medications for this visit.    OBJECTIVE: Filed Vitals:   08/23/15 0919  BP: 116/70  Pulse: 78  Temp: 96.7 F (35.9 C)  Resp: 18     Body mass index is 24.24 kg/(m^2).     ECOG FS:0 - Asymptomatic  General: Well-developed, well-nourished, no acute distress. Eyes: Pink conjunctiva, anicteric sclera. Lungs: Clear to auscultation bilaterally. Heart: Regular rate and rhythm. No rubs, murmurs, or gallops. Abdomen: Soft, nontender, nondistended. No organomegaly noted, normoactive bowel sounds. Musculoskeletal: No edema, cyanosis, or clubbing. Neuro: Alert, answering all questions appropriately. Cranial nerves grossly intact. Skin: No rashes or petechiae noted. Psych: Normal affect.   LAB RESULTS:  Lab Results  Component Value Date   NA 136 06/22/2015   K 4.3 06/22/2015   CL 101 06/22/2015   CO2 26 06/22/2015   GLUCOSE 113* 06/22/2015   BUN 19 06/22/2015   CREATININE 2.24* 06/22/2015   CALCIUM 9.3 06/22/2015   GFRNONAA 27* 06/22/2015   GFRAA 31* 06/22/2015    Lab Results  Component Value Date   WBC 3.6* 08/23/2015   NEUTROABS 2.7 08/23/2015   HGB 10.0* 08/23/2015   HCT 28.9* 08/23/2015   MCV 93.2 08/23/2015   PLT 65* 08/23/2015   Lab Results  Component Value Date   IRON 63 07/11/2015   TIBC 274 07/11/2015   IRONPCTSAT 23 07/11/2015    Lab Results  Component Value Date   FERRITIN 107 07/11/2015   Lab Results  Component Value Date   TOTALPROTELP 6.1 07/11/2015   ALBUMINELP 3.7 07/11/2015   A1GS 0.2 07/11/2015   A2GS 0.5 07/11/2015   BETS 0.7 07/11/2015   GAMS 0.9 07/11/2015   MSPIKE Not Observed 07/11/2015   SPEI Comment 07/11/2015     STUDIES: No results found.  ASSESSMENT: Pancytopenia.  PLAN:    1. Thrombocytopenia:  Patient's platelet count only marginally improved to 65 with his first dose of Rituxan. Proceed with cycle 2 of 4 of weekly Rituxan. Goal platelet count will be greater than 100 in order to proceed with his neck surgery. If Rituxan does not work, will give patient Nplate as third line therapy. Patient does not require bone marrow biopsy, but may require one in the future.  Return to clinic in 1 week for  consideration of cycle 3. 2. Anemia: Likely secondary to chronic renal insufficiency. Iron stores and the remainder of his laboratory work are within normal limits.  If patient's hemoglobin declines below 10.0, he may benefit from Procrit in the future. 3. Leukopenia: Mild, monitor.  4. Chronic renal insufficiency: Unclear patient's baseline, monitor.  Patient expressed understanding and was in agreement with this plan. He also understands that He can call clinic at any time with any questions, concerns, or complaints.    Lloyd Huger, MD   08/25/2015 7:38 PM

## 2015-08-25 NOTE — Progress Notes (Signed)
[place]  [phone] [phone]) [phone]  ID: Robert Parks OB: March 06, 1940  MR#: [id number]  VVO#:[id number]  Patient Care Team: Provider Default, MD as PCP - General  CHIEF COMPLAINT:  Chief Complaint  Patient presents with  . ITP    INTERVAL HISTORY: Patient returns to clinic today For further evaluation and to initiate cycle 1 of 4 of weekly Rituxan. He continues to feel weak and fatigued today. He also has occasional dyspnea on exertion. He denies any easy bleeding or bruising. He denies any recent fevers or illnesses. He has no neurologic complaints. He has a good appetite and denies weight loss. He denies any chest pain. He denies any nausea, vomiting, constipation, or diarrhea. He has no urinary complaints. Patient offers no further specific complaints today.  REVIEW OF SYSTEMS:   Review of Systems  Constitutional: Positive for malaise/fatigue. Negative for fever and weight loss.  Respiratory: Positive for shortness of breath. Negative for cough.   Cardiovascular: Negative.  Negative for chest pain.  Gastrointestinal: Negative.  Negative for blood in stool and melena.  Musculoskeletal: Positive for neck pain.  Neurological: Positive for weakness.  Endo/Heme/Allergies: Does not bruise/bleed easily.  Psychiatric/Behavioral: Negative.     As per HPI. Otherwise, a complete review of systems is negatve.  PAST MEDICAL HISTORY: Past Medical History  Diagnosis Date  . Hypertension   . Hyperlipidemia   . Coronary artery disease   . Diabetes mellitus without complication (HCC)     DIET CONTROLLED   . GERD (gastroesophageal reflux disease)   . Chronic kidney disease     follwed by Nephrologist Dr Juanito Doom in [place], Stage 3  . Arthritis   . DDD (degenerative disc disease), cervical   . Frequent urination   . Gout     PAST SURGICAL HISTORY: Past Surgical History  Procedure Laterality Date  . Appendectomy    . Colonoscopy with  propofol N/A 08/21/2014    Procedure: COLONOSCOPY WITH PROPOFOL;  Surgeon: Hulen Luster, MD;  Location: [facility] ENDOSCOPY;  Service: Gastroenterology;  Laterality: N/A;  . Back surgery  1980'S  . Coronary artery bypass graft  1994  . Eye surgery Left     Cataract    FAMILY HISTORY: Reviewed and unchanged. No reported history of malignancy or chronic disease.     ADVANCED DIRECTIVES:    HEALTH MAINTENANCE: Social History  Substance Use Topics  . Smoking status: Former Smoker -- 30 years  . Smokeless tobacco: Never Used     Comment: quit in his 97's  . Alcohol Use: No     Colonoscopy:  PAP:  Bone density:  Lipid panel:  No Known Allergies  Current Outpatient Prescriptions  Medication Sig Dispense Refill  . amLODipine (NORVASC) 10 MG tablet Take 10 mg by mouth daily.    Marland Kitchen aspirin 81 MG tablet Take 81 mg by mouth daily.    Marland Kitchen atorvastatin (LIPITOR) 10 MG tablet Take 10 mg by mouth daily.    . colchicine 0.6 MG tablet Take 0.3 mg by mouth daily.     . furosemide (LASIX) 20 MG tablet Take 40 mg by mouth daily.    . hydrALAZINE (APRESOLINE) 100 MG tablet Take 100 mg by mouth 2 (two) times daily.    . isosorbide mononitrate (IMDUR) 120 MG 24 hr tablet Take 240 mg by mouth daily.     . metoprolol succinate (TOPROL-XL) 100 MG 24 hr tablet Take 200 mg by mouth daily. Take with or immediately following a meal.    .  nitroGLYCERIN (NITROSTAT) 0.4 MG SL tablet Place 0.4 mg under the tongue every 5 (five) minutes as needed for chest pain.    . potassium chloride SA (K-DUR,KLOR-CON) 20 MEQ tablet Take 20 mEq by mouth daily.    . ranolazine (RANEXA) 1000 MG SR tablet Take 500 mg by mouth 2 (two) times daily.    . tamsulosin (FLOMAX) 0.4 MG CAPS capsule Take 0.4 mg by mouth daily.     No current facility-administered medications for this visit.    OBJECTIVE: Filed Vitals:   08/16/15 0932  BP: 112/62  Pulse: 71  Temp: 97.4 F (36.3 C)  Resp: 18     Body mass index is 24.32 kg/(m^2).     ECOG FS:0 - Asymptomatic  General: Well-developed, well-nourished, no acute distress. Eyes: Pink conjunctiva, anicteric sclera. Lungs: Clear to auscultation bilaterally. Heart: Regular rate and rhythm. No rubs, murmurs, or gallops. Abdomen: Soft, nontender, nondistended. No organomegaly noted, normoactive bowel sounds. Musculoskeletal: No edema, cyanosis, or clubbing. Neuro: Alert, answering all questions appropriately. Cranial nerves grossly intact. Skin: No rashes or petechiae noted. Psych: Normal affect.   LAB RESULTS:  Lab Results  Component Value Date   NA 136 06/22/2015   K 4.3 06/22/2015   CL 101 06/22/2015   CO2 26 06/22/2015   GLUCOSE 113* 06/22/2015   BUN 19 06/22/2015   CREATININE 2.24* 06/22/2015   CALCIUM 9.3 06/22/2015   GFRNONAA 27* 06/22/2015   GFRAA 31* 06/22/2015    Lab Results  Component Value Date   WBC 3.6* 08/23/2015   NEUTROABS 2.7 08/23/2015   HGB 10.0* 08/23/2015   HCT 28.9* 08/23/2015   MCV 93.2 08/23/2015   PLT 65* 08/23/2015   Lab Results  Component Value Date   IRON 63 07/11/2015   TIBC 274 07/11/2015   IRONPCTSAT 23 07/11/2015    Lab Results  Component Value Date   FERRITIN 107 07/11/2015   Lab Results  Component Value Date   TOTALPROTELP 6.1 07/11/2015   ALBUMINELP 3.7 07/11/2015   A1GS 0.2 07/11/2015   A2GS 0.5 07/11/2015   BETS 0.7 07/11/2015   GAMS 0.9 07/11/2015   MSPIKE Not Observed 07/11/2015   SPEI Comment 07/11/2015     STUDIES: No results found.  ASSESSMENT: Pancytopenia.  PLAN:    1. Thrombocytopenia:  Patient's platelet count continues to be decreased and essentially unchanged despite receiving high-dose prednisone. He can be considered a prednisone failure, therefore will proceed with second line treatment using weekly Rituxan 375 mg/m 4 weeks. Goal platelet count will be greater than 100 in order to proceed with his neck surgery. If Rituxan does not work, will give patient Nplate as third line therapy.  Patient does not require bone marrow biopsy, but may require one in the future.  Proceed with cycle 1. Return to clinic in 1 week for consideration of cycle 2. 2. Anemia: Likely secondary to chronic renal insufficiency. Iron stores and the remainder of his laboratory work are within normal limits.  If patient's hemoglobin declines below 10.0, he may benefit from Procrit in the future. 3. Leukopenia: Mild, monitor.  4. Chronic renal insufficiency: Unclear patient's baseline, monitor.  Patient expressed understanding and was in agreement with this plan. He also understands that He can call clinic at any time with any questions, concerns, or complaints.    Lloyd Huger, MD   08/25/2015 7:35 PM

## 2015-08-30 ENCOUNTER — Inpatient Hospital Stay (HOSPITAL_BASED_OUTPATIENT_CLINIC_OR_DEPARTMENT_OTHER): Payer: Medicare HMO | Admitting: Oncology

## 2015-08-30 ENCOUNTER — Inpatient Hospital Stay: Payer: Medicare HMO

## 2015-08-30 ENCOUNTER — Inpatient Hospital Stay: Payer: Medicare HMO | Attending: Oncology

## 2015-08-30 VITALS — BP 114/65 | HR 70 | Resp 18

## 2015-08-30 VITALS — BP 110/67 | HR 84 | Temp 95.4°F | Resp 18 | Wt 159.4 lb

## 2015-08-30 DIAGNOSIS — R0609 Other forms of dyspnea: Secondary | ICD-10-CM | POA: Insufficient documentation

## 2015-08-30 DIAGNOSIS — Z5112 Encounter for antineoplastic immunotherapy: Secondary | ICD-10-CM | POA: Insufficient documentation

## 2015-08-30 DIAGNOSIS — R350 Frequency of micturition: Secondary | ICD-10-CM | POA: Insufficient documentation

## 2015-08-30 DIAGNOSIS — M129 Arthropathy, unspecified: Secondary | ICD-10-CM | POA: Diagnosis not present

## 2015-08-30 DIAGNOSIS — K219 Gastro-esophageal reflux disease without esophagitis: Secondary | ICD-10-CM | POA: Diagnosis not present

## 2015-08-30 DIAGNOSIS — D649 Anemia, unspecified: Secondary | ICD-10-CM | POA: Insufficient documentation

## 2015-08-30 DIAGNOSIS — Z79899 Other long term (current) drug therapy: Secondary | ICD-10-CM | POA: Diagnosis not present

## 2015-08-30 DIAGNOSIS — N183 Chronic kidney disease, stage 3 (moderate): Secondary | ICD-10-CM | POA: Insufficient documentation

## 2015-08-30 DIAGNOSIS — Z87891 Personal history of nicotine dependence: Secondary | ICD-10-CM | POA: Diagnosis not present

## 2015-08-30 DIAGNOSIS — M1990 Unspecified osteoarthritis, unspecified site: Secondary | ICD-10-CM | POA: Insufficient documentation

## 2015-08-30 DIAGNOSIS — I129 Hypertensive chronic kidney disease with stage 1 through stage 4 chronic kidney disease, or unspecified chronic kidney disease: Secondary | ICD-10-CM

## 2015-08-30 DIAGNOSIS — E785 Hyperlipidemia, unspecified: Secondary | ICD-10-CM

## 2015-08-30 DIAGNOSIS — I2510 Atherosclerotic heart disease of native coronary artery without angina pectoris: Secondary | ICD-10-CM | POA: Diagnosis not present

## 2015-08-30 DIAGNOSIS — E1122 Type 2 diabetes mellitus with diabetic chronic kidney disease: Secondary | ICD-10-CM

## 2015-08-30 DIAGNOSIS — Z7982 Long term (current) use of aspirin: Secondary | ICD-10-CM | POA: Insufficient documentation

## 2015-08-30 DIAGNOSIS — M5030 Other cervical disc degeneration, unspecified cervical region: Secondary | ICD-10-CM | POA: Diagnosis not present

## 2015-08-30 DIAGNOSIS — R0602 Shortness of breath: Secondary | ICD-10-CM

## 2015-08-30 DIAGNOSIS — D72819 Decreased white blood cell count, unspecified: Secondary | ICD-10-CM | POA: Diagnosis not present

## 2015-08-30 DIAGNOSIS — M109 Gout, unspecified: Secondary | ICD-10-CM

## 2015-08-30 DIAGNOSIS — D693 Immune thrombocytopenic purpura: Secondary | ICD-10-CM

## 2015-08-30 LAB — CBC WITH DIFFERENTIAL/PLATELET
BASOS ABS: 0 10*3/uL (ref 0–0.1)
BASOS PCT: 1 %
Eosinophils Absolute: 0 10*3/uL (ref 0–0.7)
Eosinophils Relative: 1 %
HEMATOCRIT: 29.9 % — AB (ref 40.0–52.0)
HEMOGLOBIN: 10.2 g/dL — AB (ref 13.0–18.0)
Lymphocytes Relative: 12 %
Lymphs Abs: 0.4 10*3/uL — ABNORMAL LOW (ref 1.0–3.6)
MCH: 31.9 pg (ref 26.0–34.0)
MCHC: 34.3 g/dL (ref 32.0–36.0)
MCV: 93.1 fL (ref 80.0–100.0)
Monocytes Absolute: 0.5 10*3/uL (ref 0.2–1.0)
Monocytes Relative: 16 %
NEUTROS ABS: 2.2 10*3/uL (ref 1.4–6.5)
NEUTROS PCT: 70 %
Platelets: 136 10*3/uL — ABNORMAL LOW (ref 150–440)
RBC: 3.21 MIL/uL — ABNORMAL LOW (ref 4.40–5.90)
RDW: 14.5 % (ref 11.5–14.5)
WBC: 3.1 10*3/uL — ABNORMAL LOW (ref 3.8–10.6)

## 2015-08-30 MED ORDER — SODIUM CHLORIDE 0.9 % IV SOLN: 375.0000 mg/m2 | Freq: Once | INTRAVENOUS | Status: DC

## 2015-08-30 MED ORDER — ACETAMINOPHEN 325 MG PO TABS
650.0000 mg | ORAL_TABLET | Freq: Once | ORAL | Status: AC
  Administered 2015-08-30: 650 mg via ORAL
  Filled 2015-08-30: qty 2

## 2015-08-30 MED ORDER — SODIUM CHLORIDE 0.9 % IV SOLN
375.0000 mg/m2 | Freq: Once | INTRAVENOUS | Status: AC
  Administered 2015-08-30: 700 mg via INTRAVENOUS
  Filled 2015-08-30: qty 70

## 2015-08-30 MED ORDER — SODIUM CHLORIDE 0.9 % IV SOLN
Freq: Once | INTRAVENOUS | Status: AC
  Administered 2015-08-30: 10:00:00 via INTRAVENOUS
  Filled 2015-08-30: qty 1000

## 2015-08-30 MED ORDER — DIPHENHYDRAMINE HCL 25 MG PO CAPS
25.0000 mg | ORAL_CAPSULE | Freq: Once | ORAL | Status: AC
  Administered 2015-08-30: 25 mg via ORAL
  Filled 2015-08-30: qty 1

## 2015-08-30 MED ORDER — HEPARIN SOD (PORK) LOCK FLUSH 100 UNIT/ML IV SOLN: 500.0000 [IU] | Freq: Once | INTRAVENOUS | Status: DC | PRN

## 2015-08-30 NOTE — Progress Notes (Signed)
Would like MD to evaluated medications to see if can come off a few medications. Pt states does not know which ones but he feels better when he doesn't take his evening meds.

## 2015-08-30 NOTE — Progress Notes (Signed)
[place]  [phone] [phone]) [phone]  ID: Robert Parks OB: 01-Jul-1940  MR#: [id number]  NIO#:[id number]  Patient Care Team: Provider Default, MD as PCP - General  CHIEF COMPLAINT:  Chief Complaint  Patient presents with  . ITP    INTERVAL HISTORY: Patient returns to clinic today for further evaluation and consideration of cycle 3 of 4 of weekly Rituxan. He does not complain of weakness or fatigue today. He continues to have occasional dyspnea on exertion. He denies any easy bleeding or bruising. He denies any recent fevers or illnesses. He has no neurologic complaints. He has a good appetite and denies weight loss. He denies any chest pain. He denies any nausea, vomiting, constipation, or diarrhea. He has no urinary complaints. Patient offers no further specific complaints today.  REVIEW OF SYSTEMS:   Review of Systems  Constitutional: Negative for fever, weight loss and malaise/fatigue.  Respiratory: Positive for shortness of breath. Negative for cough.   Cardiovascular: Negative.  Negative for chest pain.  Gastrointestinal: Negative.  Negative for blood in stool and melena.  Musculoskeletal: Positive for neck pain.  Neurological: Negative for weakness.  Endo/Heme/Allergies: Does not bruise/bleed easily.  Psychiatric/Behavioral: Negative.     As per HPI. Otherwise, a complete review of systems is negatve.  PAST MEDICAL HISTORY: Past Medical History  Diagnosis Date  . Hypertension   . Hyperlipidemia   . Coronary artery disease   . Diabetes mellitus without complication (HCC)     DIET CONTROLLED   . GERD (gastroesophageal reflux disease)   . Chronic kidney disease     follwed by Nephrologist Dr Juanito Doom in [place], Stage 3  . Arthritis   . DDD (degenerative disc disease), cervical   . Frequent urination   . Gout     PAST SURGICAL HISTORY: Past Surgical History  Procedure Laterality Date  . Appendectomy    . Colonoscopy with  propofol N/A 08/21/2014    Procedure: COLONOSCOPY WITH PROPOFOL;  Surgeon: Hulen Luster, MD;  Location: [facility] ENDOSCOPY;  Service: Gastroenterology;  Laterality: N/A;  . Back surgery  1980'S  . Coronary artery bypass graft  1994  . Eye surgery Left     Cataract    FAMILY HISTORY: Reviewed and unchanged. No reported history of malignancy or chronic disease.     ADVANCED DIRECTIVES:    HEALTH MAINTENANCE: Social History  Substance Use Topics  . Smoking status: Former Smoker -- 30 years  . Smokeless tobacco: Never Used     Comment: quit in his 52's  . Alcohol Use: No     Colonoscopy:  PAP:  Bone density:  Lipid panel:  No Known Allergies  Current Outpatient Prescriptions  Medication Sig Dispense Refill  . acetaminophen ([facility] 8 HOUR PAIN RELIEF) 650 MG CR tablet Take 650 mg by mouth every 8 (eight) hours as needed.    Marland Kitchen amLODipine (NORVASC) 10 MG tablet Take 10 mg by mouth daily.    Marland Kitchen aspirin 81 MG tablet Take 81 mg by mouth daily.    Marland Kitchen atorvastatin (LIPITOR) 10 MG tablet Take 10 mg by mouth daily.    . colchicine 0.6 MG tablet Take 0.3 mg by mouth daily.     . furosemide (LASIX) 20 MG tablet Take 40 mg by mouth daily.    . hydrALAZINE (APRESOLINE) 100 MG tablet Take 100 mg by mouth 2 (two) times daily.    . isosorbide mononitrate (IMDUR) 120 MG 24 hr tablet Take 240 mg by mouth daily.     Marland Kitchen  metoprolol succinate (TOPROL-XL) 100 MG 24 hr tablet Take 200 mg by mouth daily. Take with or immediately following a meal.    . nitroGLYCERIN (NITROSTAT) 0.4 MG SL tablet Place 0.4 mg under the tongue every 5 (five) minutes as needed for chest pain.    . potassium chloride SA (K-DUR,KLOR-CON) 20 MEQ tablet Take 20 mEq by mouth daily.    . ranolazine (RANEXA) 1000 MG SR tablet Take 500 mg by mouth 2 (two) times daily.    . tamsulosin (FLOMAX) 0.4 MG CAPS capsule Take 0.4 mg by mouth daily.     No current facility-administered medications for this visit.   Facility-Administered Medications  Ordered in Other Visits  Medication Dose Route Frequency Provider Last Rate Last Dose  . heparin lock flush 100 unit/mL  500 Units Intracatheter Once PRN Lloyd Huger, MD      . riTUXimab (RITUXAN) 700 mg in sodium chloride 0.9 % 180 mL chemo infusion  375 mg/m2 Intravenous Once Lloyd Huger, MD   Stopped at 08/30/15 1221    OBJECTIVE: Filed Vitals:   08/30/15 0927  BP: 110/67  Pulse: 84  Temp: 95.4 F (35.2 C)  Resp: 18     Body mass index is 24.24 kg/(m^2).    ECOG FS:0 - Asymptomatic  General: Well-developed, well-nourished, no acute distress. Eyes: Pink conjunctiva, anicteric sclera. Lungs: Clear to auscultation bilaterally. Heart: Regular rate and rhythm. No rubs, murmurs, or gallops. Abdomen: Soft, nontender, nondistended. No organomegaly noted, normoactive bowel sounds. Musculoskeletal: No edema, cyanosis, or clubbing. Neuro: Alert, answering all questions appropriately. Cranial nerves grossly intact. Skin: No rashes or petechiae noted. Psych: Normal affect.   LAB RESULTS:  Lab Results  Component Value Date   NA 136 06/22/2015   K 4.3 06/22/2015   CL 101 06/22/2015   CO2 26 06/22/2015   GLUCOSE 113* 06/22/2015   BUN 19 06/22/2015   CREATININE 2.24* 06/22/2015   CALCIUM 9.3 06/22/2015   GFRNONAA 27* 06/22/2015   GFRAA 31* 06/22/2015    Lab Results  Component Value Date   WBC 3.1* 08/30/2015   NEUTROABS 2.2 08/30/2015   HGB 10.2* 08/30/2015   HCT 29.9* 08/30/2015   MCV 93.1 08/30/2015   PLT 136* 08/30/2015   Lab Results  Component Value Date   IRON 63 07/11/2015   TIBC 274 07/11/2015   IRONPCTSAT 23 07/11/2015    Lab Results  Component Value Date   FERRITIN 107 07/11/2015   Lab Results  Component Value Date   TOTALPROTELP 6.1 07/11/2015   ALBUMINELP 3.7 07/11/2015   A1GS 0.2 07/11/2015   A2GS 0.5 07/11/2015   BETS 0.7 07/11/2015   GAMS 0.9 07/11/2015   MSPIKE Not Observed 07/11/2015   SPEI Comment 07/11/2015     STUDIES: No  results found.  ASSESSMENT: ITP  PLAN:    1. ITP:  Patient's platelet count is improving and is now 136. Proceed with cycle 3 of 4 of weekly Rituxan. Goal platelet count will be greater than 100 in order to proceed with his neck surgery. Patient does not require bone marrow biopsy, but may require one in the future.  Return to clinic in 1 week for consideration of cycle 4. 2. Anemia: Likely secondary to chronic renal insufficiency. Iron stores and the remainder of his laboratory work are within normal limits.  If patient's hemoglobin declines below 10.0, he may benefit from Procrit in the future. 3. Leukopenia: Mild, monitor.  4. Chronic renal insufficiency: Unclear patient's baseline, monitor.  Patient  expressed understanding and was in agreement with this plan. He also understands that He can call clinic at any time with any questions, concerns, or complaints.    Lloyd Huger, MD   08/30/2015 12:19 PM

## 2015-09-06 ENCOUNTER — Inpatient Hospital Stay (HOSPITAL_BASED_OUTPATIENT_CLINIC_OR_DEPARTMENT_OTHER): Payer: Medicare HMO | Admitting: Oncology

## 2015-09-06 ENCOUNTER — Inpatient Hospital Stay: Payer: Medicare HMO

## 2015-09-06 ENCOUNTER — Encounter: Payer: Self-pay | Admitting: *Deleted

## 2015-09-06 VITALS — BP 108/61 | HR 67 | Temp 95.0°F | Resp 18 | Wt 160.4 lb

## 2015-09-06 DIAGNOSIS — R0602 Shortness of breath: Secondary | ICD-10-CM

## 2015-09-06 DIAGNOSIS — D649 Anemia, unspecified: Secondary | ICD-10-CM

## 2015-09-06 DIAGNOSIS — N183 Chronic kidney disease, stage 3 (moderate): Secondary | ICD-10-CM | POA: Diagnosis not present

## 2015-09-06 DIAGNOSIS — D693 Immune thrombocytopenic purpura: Secondary | ICD-10-CM

## 2015-09-06 DIAGNOSIS — Z7982 Long term (current) use of aspirin: Secondary | ICD-10-CM

## 2015-09-06 DIAGNOSIS — E785 Hyperlipidemia, unspecified: Secondary | ICD-10-CM

## 2015-09-06 DIAGNOSIS — E1122 Type 2 diabetes mellitus with diabetic chronic kidney disease: Secondary | ICD-10-CM

## 2015-09-06 DIAGNOSIS — I129 Hypertensive chronic kidney disease with stage 1 through stage 4 chronic kidney disease, or unspecified chronic kidney disease: Secondary | ICD-10-CM | POA: Diagnosis not present

## 2015-09-06 DIAGNOSIS — Z79899 Other long term (current) drug therapy: Secondary | ICD-10-CM

## 2015-09-06 DIAGNOSIS — I2510 Atherosclerotic heart disease of native coronary artery without angina pectoris: Secondary | ICD-10-CM

## 2015-09-06 DIAGNOSIS — Z87891 Personal history of nicotine dependence: Secondary | ICD-10-CM

## 2015-09-06 DIAGNOSIS — R0609 Other forms of dyspnea: Secondary | ICD-10-CM

## 2015-09-06 DIAGNOSIS — Z5112 Encounter for antineoplastic immunotherapy: Secondary | ICD-10-CM | POA: Diagnosis not present

## 2015-09-06 LAB — CBC WITH DIFFERENTIAL/PLATELET
Basophils Absolute: 0 10*3/uL (ref 0–0.1)
Basophils Relative: 0 %
EOS ABS: 0 10*3/uL (ref 0–0.7)
EOS PCT: 1 %
HCT: 28.2 % — ABNORMAL LOW (ref 40.0–52.0)
Hemoglobin: 9.6 g/dL — ABNORMAL LOW (ref 13.0–18.0)
LYMPHS ABS: 0.6 10*3/uL — AB (ref 1.0–3.6)
Lymphocytes Relative: 11 %
MCH: 31.5 pg (ref 26.0–34.0)
MCHC: 34 g/dL (ref 32.0–36.0)
MCV: 92.7 fL (ref 80.0–100.0)
MONO ABS: 0.5 10*3/uL (ref 0.2–1.0)
MONOS PCT: 10 %
Neutro Abs: 3.9 10*3/uL (ref 1.4–6.5)
Neutrophils Relative %: 78 %
PLATELETS: 121 10*3/uL — AB (ref 150–440)
RBC: 3.04 MIL/uL — AB (ref 4.40–5.90)
RDW: 14.4 % (ref 11.5–14.5)
WBC: 5 10*3/uL (ref 3.8–10.6)

## 2015-09-06 MED ORDER — SODIUM CHLORIDE 0.9 % IV SOLN
375.0000 mg/m2 | Freq: Once | INTRAVENOUS | Status: AC
  Administered 2015-09-06: 700 mg via INTRAVENOUS
  Filled 2015-09-06: qty 60

## 2015-09-06 MED ORDER — ACETAMINOPHEN 325 MG PO TABS
650.0000 mg | ORAL_TABLET | Freq: Once | ORAL | Status: AC
  Administered 2015-09-06: 650 mg via ORAL
  Filled 2015-09-06: qty 2

## 2015-09-06 MED ORDER — SODIUM CHLORIDE 0.9 % IV SOLN: 375.0000 mg/m2 | Freq: Once | INTRAVENOUS | Status: DC

## 2015-09-06 MED ORDER — DIPHENHYDRAMINE HCL 25 MG PO CAPS
25.0000 mg | ORAL_CAPSULE | Freq: Once | ORAL | Status: AC
  Administered 2015-09-06: 25 mg via ORAL
  Filled 2015-09-06: qty 1

## 2015-09-06 MED ORDER — SODIUM CHLORIDE 0.9 % IV SOLN
Freq: Once | INTRAVENOUS | Status: AC
  Administered 2015-09-06: 10:00:00 via INTRAVENOUS
  Filled 2015-09-06: qty 1000

## 2015-09-06 NOTE — Progress Notes (Signed)
Left message for Dr. Joya Salm stating patient is ready to proceed with surgery.

## 2015-09-06 NOTE — Progress Notes (Signed)
Offers no complaints  

## 2015-09-11 ENCOUNTER — Encounter: Payer: Self-pay | Admitting: *Deleted

## 2015-09-11 NOTE — Progress Notes (Signed)
[place]  Telephone:(336) [phone] [phone]) [phone]  ID: Arta Bruce OB: [birth year]  MR#: [id number]  IHK#:[id number]  Patient Care Team: Provider Default, MD as PCP - General  CHIEF COMPLAINT:  Chief Complaint  Patient presents with  . ITP    INTERVAL HISTORY: Patient returns to clinic today for further evaluation and consideration of cycle 4 of 4 of weekly Rituxan. He does not complain of weakness or fatigue today. He continues to have occasional dyspnea on exertion. He denies any easy bleeding or bruising. He denies any recent fevers or illnesses. He has no neurologic complaints. He has a good appetite and denies weight loss. He denies any chest pain. He denies any nausea, vomiting, constipation, or diarrhea. He has no urinary complaints. Patient offers no further specific complaints today.  REVIEW OF SYSTEMS:   Review of Systems  Constitutional: Negative for fever, weight loss and malaise/fatigue.  Respiratory: Positive for shortness of breath. Negative for cough.   Cardiovascular: Negative.  Negative for chest pain.  Gastrointestinal: Negative.  Negative for blood in stool and melena.  Musculoskeletal: Positive for neck pain.  Neurological: Negative for weakness.  Endo/Heme/Allergies: Does not bruise/bleed easily.  Psychiatric/Behavioral: Negative.     As per HPI. Otherwise, a complete review of systems is negatve.  PAST MEDICAL HISTORY: Past Medical History  Diagnosis Date  . Hypertension   . Hyperlipidemia   . Coronary artery disease   . Diabetes mellitus without complication (HCC)     DIET CONTROLLED   . GERD (gastroesophageal reflux disease)   . Chronic kidney disease     follwed by Nephrologist Dr Juanito Doom in [place], Stage 3  . Arthritis   . DDD (degenerative disc disease), cervical   . Frequent urination   . Gout     PAST SURGICAL HISTORY: Past Surgical History  Procedure Laterality Date  . Appendectomy    . Colonoscopy with  propofol N/A 08/21/2014    Procedure: COLONOSCOPY WITH PROPOFOL;  Surgeon: Hulen Luster, MD;  Location: [facility] ENDOSCOPY;  Service: Gastroenterology;  Laterality: N/A;  . Back surgery  1980'S  . Coronary artery bypass graft  1994  . Eye surgery Left     Cataract    FAMILY HISTORY: Reviewed and unchanged. No reported history of malignancy or chronic disease.     ADVANCED DIRECTIVES:    HEALTH MAINTENANCE: Social History  Substance Use Topics  . Smoking status: Former Smoker -- 30 years  . Smokeless tobacco: Never Used     Comment: quit in his 63's  . Alcohol Use: No     Colonoscopy:  PAP:  Bone density:  Lipid panel:  No Known Allergies  Current Outpatient Prescriptions  Medication Sig Dispense Refill  . acetaminophen ([facility] 8 HOUR PAIN RELIEF) 650 MG CR tablet Take 650 mg by mouth every 8 (eight) hours as needed.    Marland Kitchen amLODipine (NORVASC) 10 MG tablet Take 10 mg by mouth daily.    Marland Kitchen aspirin 81 MG tablet Take 81 mg by mouth daily.    Marland Kitchen atorvastatin (LIPITOR) 10 MG tablet Take 10 mg by mouth daily.    . colchicine 0.6 MG tablet Take 0.3 mg by mouth daily.     . furosemide (LASIX) 20 MG tablet Take 40 mg by mouth daily.    . hydrALAZINE (APRESOLINE) 100 MG tablet Take 100 mg by mouth 2 (two) times daily.    . isosorbide mononitrate (IMDUR) 120 MG 24 hr tablet Take 240 mg by mouth daily.     Marland Kitchen  metoprolol succinate (TOPROL-XL) 100 MG 24 hr tablet Take 200 mg by mouth daily. Take with or immediately following a meal.    . nitroGLYCERIN (NITROSTAT) 0.4 MG SL tablet Place 0.4 mg under the tongue every 5 (five) minutes as needed for chest pain.    . potassium chloride SA (K-DUR,KLOR-CON) 20 MEQ tablet Take 20 mEq by mouth daily.    . ranolazine (RANEXA) 1000 MG SR tablet Take 500 mg by mouth 2 (two) times daily.    . tamsulosin (FLOMAX) 0.4 MG CAPS capsule Take 0.4 mg by mouth daily.     No current facility-administered medications for this visit.    OBJECTIVE: Filed Vitals:    09/06/15 0907  BP: 108/61  Pulse: 67  Temp: 95 F (35 C)  Resp: 18     Body mass index is 24.39 kg/(m^2).    ECOG FS:0 - Asymptomatic  General: Well-developed, well-nourished, no acute distress. Eyes: Pink conjunctiva, anicteric sclera. Lungs: Clear to auscultation bilaterally. Heart: Regular rate and rhythm. No rubs, murmurs, or gallops. Abdomen: Soft, nontender, nondistended. No organomegaly noted, normoactive bowel sounds. Musculoskeletal: No edema, cyanosis, or clubbing. Neuro: Alert, answering all questions appropriately. Cranial nerves grossly intact. Skin: No rashes or petechiae noted. Psych: Normal affect.   LAB RESULTS:  Lab Results  Component Value Date   NA 136 06/22/2015   K 4.3 06/22/2015   CL 101 06/22/2015   CO2 26 06/22/2015   GLUCOSE 113* 06/22/2015   BUN 19 06/22/2015   CREATININE 2.24* 06/22/2015   CALCIUM 9.3 06/22/2015   GFRNONAA 27* 06/22/2015   GFRAA 31* 06/22/2015    Lab Results  Component Value Date   WBC 5.0 09/06/2015   NEUTROABS 3.9 09/06/2015   HGB 9.6* 09/06/2015   HCT 28.2* 09/06/2015   MCV 92.7 09/06/2015   PLT 121* 09/06/2015   Lab Results  Component Value Date   IRON 63 07/11/2015   TIBC 274 07/11/2015   IRONPCTSAT 23 07/11/2015    Lab Results  Component Value Date   FERRITIN 107 07/11/2015   Lab Results  Component Value Date   TOTALPROTELP 6.1 07/11/2015   ALBUMINELP 3.7 07/11/2015   A1GS 0.2 07/11/2015   A2GS 0.5 07/11/2015   BETS 0.7 07/11/2015   GAMS 0.9 07/11/2015   MSPIKE Not Observed 07/11/2015   SPEI Comment 07/11/2015     STUDIES: No results found.  ASSESSMENT: ITP  PLAN:    1. ITP:  Patient's platelet count has improved is stable at 121. Proceed with cycle 4 of 4 of weekly Rituxan. Goal platelet count will be greater than 100 in order to proceed with his neck surgery. Patient does not require bone marrow biopsy, but may require one in the future.  Return to clinic in 4 weeks for repeat laboratory  work and further evaluation. 2. Anemia: Likely secondary to chronic renal insufficiency. Iron stores and the remainder of his laboratory work are within normal limits.  If patient's hemoglobin remained persistently below 10.0, he may benefit from Procrit in the future. 3. Leukopenia: Resolved.  4. Chronic renal insufficiency: Unclear patient's baseline, monitor.  Patient expressed understanding and was in agreement with this plan. He also understands that He can call clinic at any time with any questions, concerns, or complaints.    Lloyd Huger, MD   09/11/2015 8:16 AM

## 2015-09-11 NOTE — Progress Notes (Signed)
Office note faxed to Dr. Joya Salm.

## 2015-09-24 ENCOUNTER — Other Ambulatory Visit: Payer: Self-pay | Admitting: Neurosurgery

## 2015-09-27 NOTE — Progress Notes (Addendum)
Anesthesia chart review: Patient is a 75 year old male scheduled for C3-4, C4-5, C5-6 ACDF on 10/03/15 by Dr. Joya Salm. Surgery was initially scheduled for 06/26/15 but was discussed with anesthesiologist Dr. Lissa Hoard with recommendation to postpone to allow time for cardiology (CAD) and hematology (thrombocytopenia) follow-up.   Since then patient was seen by hematologist Dr. Delight Hoh and diagnosed with ITP s/p Rituxan. As of 09/06/15, Dr. Grayland Ormond stated that "patient's platelet count is greater than 100, he is getting Rituxan therapy and from a hematological standpoint, patient is OK to proceed with surgery."   He also was re-evaluated by his cardiologist Dr. Posey Boyer [facility]) on 07/18/15 and wrote, "I do not believe that he isn't prohibitive risk for surgery but should attempt to stay on his clopidogrel during the perioperative period." We will need to clarify clopidogrel instructions given by Dr. Joya Salm.   History includes former smoker, hypertension, hyperlipidemia, diet controlled diabetes mellitus type 2, CAD s/p CABG (RIMA-RCA, LIMA-OM, Gastroepiploic-LAD) '94, GERD, chronic kidney disease stage IIIB, arthritis, appendectomy, back surgery.  Meds include aspirin 5m, amlodipine, Lipitor, colchicine, Lasix, hydralazine, Imdur, Toprol XL, nitro, KCl, Ranexa, Flomax. At PAT on 10/01/15, patient  reported that he is off Plavix, and reportedly hematology has advised him to hold ASA and fish oil. He received 4 total treatments of Rituxan for ITP.  - [facility]  [facility] (no signature on page received) [facility] but recommended input for nephrology and cardiology. At that point, patient was still pending hematology evaluation which has since been done and discussed above. She last saw patient on 09/21/15 for CHF symptoms and increased Lasix X 3 days with recommendations to call  if no improvement.  - Neurologist is Dr. ZGurney Maxin  - Nephrologist is Dr[facility]. 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 Last seen 07/17/15 for CKD stage IIIB. [facility] BHarley Hallmarkoffice, she spoke with him on 09/05/15, and he gave verbal "ok" for surgery if patient had already been cleared by his PCP and cardiologist. He reportedly was most concerned about patient's cardiac status.  - [facility]  - Cardiologist is Dr. MPosey [facility]with [facility] ([facility]), last visit 07/18/15 (Care Everywhere). His notes indicate patient has long standing chronic stable angina. LHC 12/2012 showed gastroepiploic-LAD occluded, a moderate to severe lesion in his proximal mid LAD, and a severe lesion in his distal LAD. The RCA filled via patent RIMA graft however there was a severe lesion in the distal PDA. Medical therapy [facility] with [facility], although patient never actually arranged this. In 11/2014, Dr. CMarylen Pontoordered a nuclear stress to evaluation for ischemia in attempt to plan for potential PCI. Stress test suggested ischemia in the inferior region which could be related to the PDA which was felt to be the most challenging lesion to intervene. There was no ischemia in the anterior territory. His chest discomfort improved with anti-anginal therapy, so [facility]; However, he continued to have fatigue and SOB. An exercise program was implemented in 03/2015, and if fatigue did not improve then Dr. CMarylen Pontowould consider revascularization to the PDA or LAD. At his 07/18/15 visit, patient felt his anginal had resolved after starting an OTC supplement. He was able to walk a block without chest  pain or SOB. Diastolic CHF symptoms improved with diuretic therapy. Primary limitation was then his leg pain which was  felt to be neurogenic in origin.  As above, he was aware of surgery plans. Six month follow-up recommended.  05/30/15 EKG [facility]): NSR, LAD, LVH with QRS widening and repolarization abnormality. Interpreting physician did not think there was any significant change when compared to 06/02/14 tracing.   04/19/15 Echo [facility]; Care Everywhere): Result Narrative:  Left ventricular hypertrophy - mild  Borderline left ventricular systolic function  Mitral regurgitation - mild  Dilated left atrium - mild  Normal right ventricular systolic function  Tricuspid regurgitation - moderate  Dilated right atrium - mild  Mildly elevated right atrial pressure  Diastolic dysfunction - grade II (elevated filling pressures)  12/01/14 Nuclear stress test [facility]; Care Everywhere): Impressions: - Abnormal myocardial perfusion study - There is a small in size, mild in severity, completely reversible defect involving the apical inferior segment. This is consistent with possible ischemia. - There is a small in size, moderate in severity, partially reversible defect involving the mid anterolateral and basal anterolateral segments. This is consistent with ischemia and cannot rule out scar. - During stress: Global systolic function is moderately to severely reduced. The ejection fraction calculated at 29%.  - Comparison with the study of 2013 is not reliable due to the non-attenuation correction on the prior study, but the anterolateral defect appears new - Incidentally noted is likely gallstones (EF by echo on 12/01/14 was 55%.)  01/12/13 Cardiac cath [facility]; Care Everywhere): CORONARY ANGIOGRAPHY - The left main (LMCA) is a large-caliber vessel that originates from the left coronary sinus. It bifurcates into the left anterior descending (LAD) and left circumflex (LCx) arteries. There is no angiographic evidence of significant disease in the LMCA. - The LAD is a large-caliber vessel that  gives off four small-sized diagonal (D) branches before it wraps around the apex. There is diffuse disease in the LAD with a 40% mid lesion and up to 70% lesion distally. - The LCx is a medium-caliber vessel that gives off one significant obtuse marginal (OM) branch and then continues as a small vessel in the AV groove. OM1 is occluded proximally and is filled distally by the LIMA graft. There is diffuse disease in the LCx up to 70%. - The right coronary artery (RCA) was not engaged in this study as it is known to be occluded. Grafts: LIMA to the OM is patent. RIMA to the RCA is patent. Gastroepiploic known to be occluded- not re-injected. SUMMARY FINDINGS: Coronary Disease Significant three vessel coronary disease. Patent RIMA and LIMA No targets ammenable to percutaneous intervention. Filling Pressure/LVEDP Normal left ventricular end diastolic filling pressure. (LVEDP = 10 mmHg) PLAN/DISPOSITION AND FOLLOWUP: Recommendations: Continue aggressive risk factor modification   06/08/15 MRI c-spine: IMPRESSION: 1. 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 Recommend spine surgery consultation. 2. 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  Labs from yesterday noted. Cr 2.38, eGFR 29. (In Care Everywhere Cr trends have been 1.75-2.1 since 05/16/11, with Cr 2.24 on 06/22/15). PLT count 82K. (PLT count 84-111 since 05/21/11 but down to 53K 08/16/15 and up to 136K 08/2014 with Rituxan). HGB 9.4, previously in the 10-11 range since 05/21/11 and last 9.6 on  09/06/15. T&S, HFP, PT/PTT added since he has known thrombocytopenia and anemia history (with no recent LFT or PT/PTT noted). A1c 5.6 on 06/22/15.   Abnormal labs results called to  [facility] at Dr. Harley Hallmark office this morning. She will have Dr. Joya Salm review to ensure he feels they are acceptable for OR from his standpoint. Cardiologist, PCP, hematologist, nephrologist have all weighed in about surgery as discussed above. Anesthesiologist/surgeon will evaluate patient prior to surgery and will need to ensure no acute CV/CHF symptoms/changes prior to proceeding. Will add prepare PRBC since Hgb < 10. Will defer decision for transfusion to surgeon/anesthesiologist. Updated anesthesiologist Dr. Ermalene Postin.   [facility]/Anesthesiology Phone [phone] 10/02/2015 3:56 PM

## 2015-10-01 ENCOUNTER — Encounter (HOSPITAL_COMMUNITY)
Admission: RE | Admit: 2015-10-01 | Discharge: 2015-10-01 | Disposition: A | Discharge status: Home / Self Care | Payer: Medicare HMO | Source: Ambulatory Visit | Attending: Neurosurgery | Admitting: Neurosurgery

## 2015-10-01 ENCOUNTER — Encounter (HOSPITAL_COMMUNITY): Payer: Self-pay

## 2015-10-01 DIAGNOSIS — E785 Hyperlipidemia, unspecified: Secondary | ICD-10-CM | POA: Diagnosis not present

## 2015-10-01 DIAGNOSIS — Z951 Presence of aortocoronary bypass graft: Secondary | ICD-10-CM | POA: Diagnosis not present

## 2015-10-01 DIAGNOSIS — Z7982 Long term (current) use of aspirin: Secondary | ICD-10-CM | POA: Diagnosis not present

## 2015-10-01 DIAGNOSIS — M4802 Spinal stenosis, cervical region: Secondary | ICD-10-CM | POA: Diagnosis not present

## 2015-10-01 DIAGNOSIS — N183 Chronic kidney disease, stage 3 (moderate): Secondary | ICD-10-CM | POA: Diagnosis not present

## 2015-10-01 DIAGNOSIS — D693 Immune thrombocytopenic purpura: Secondary | ICD-10-CM | POA: Diagnosis not present

## 2015-10-01 DIAGNOSIS — K219 Gastro-esophageal reflux disease without esophagitis: Secondary | ICD-10-CM | POA: Diagnosis not present

## 2015-10-01 DIAGNOSIS — Z79899 Other long term (current) drug therapy: Secondary | ICD-10-CM | POA: Diagnosis not present

## 2015-10-01 DIAGNOSIS — Z0183 Encounter for blood typing: Secondary | ICD-10-CM | POA: Diagnosis not present

## 2015-10-01 DIAGNOSIS — E119 Type 2 diabetes mellitus without complications: Secondary | ICD-10-CM | POA: Diagnosis not present

## 2015-10-01 DIAGNOSIS — Z01818 Encounter for other preprocedural examination: Secondary | ICD-10-CM | POA: Diagnosis present

## 2015-10-01 DIAGNOSIS — Z87891 Personal history of nicotine dependence: Secondary | ICD-10-CM | POA: Diagnosis not present

## 2015-10-01 DIAGNOSIS — M1990 Unspecified osteoarthritis, unspecified site: Secondary | ICD-10-CM | POA: Diagnosis not present

## 2015-10-01 DIAGNOSIS — I2510 Atherosclerotic heart disease of native coronary artery without angina pectoris: Secondary | ICD-10-CM | POA: Diagnosis not present

## 2015-10-01 DIAGNOSIS — I129 Hypertensive chronic kidney disease with stage 1 through stage 4 chronic kidney disease, or unspecified chronic kidney disease: Secondary | ICD-10-CM | POA: Diagnosis not present

## 2015-10-01 DIAGNOSIS — Z01812 Encounter for preprocedural laboratory examination: Secondary | ICD-10-CM | POA: Diagnosis not present

## 2015-10-01 HISTORY — DX: Other pancytopenia: D61.818

## 2015-10-01 HISTORY — DX: Angina pectoris, unspecified: I20.9

## 2015-10-01 HISTORY — DX: Cerebral infarction, unspecified: I63.9

## 2015-10-01 HISTORY — DX: Reserved for inherently not codable concepts without codable children: IMO0001

## 2015-10-01 LAB — COMPREHENSIVE METABOLIC PANEL
ALBUMIN: 3.4 g/dL — AB (ref 3.5–5.0)
ALK PHOS: 80 U/L (ref 38–126)
ALT: 17 U/L (ref 17–63)
AST: 28 U/L (ref 15–41)
Anion gap: 8 (ref 5–15)
BILIRUBIN TOTAL: 0.9 mg/dL (ref 0.3–1.2)
BUN: 26 mg/dL — AB (ref 6–20)
CALCIUM: 9.2 mg/dL (ref 8.9–10.3)
CO2: 24 mmol/L (ref 22–32)
CREATININE: 2.38 mg/dL — AB (ref 0.61–1.24)
Chloride: 107 mmol/L (ref 101–111)
GFR calc Af Amer: 29 mL/min — ABNORMAL LOW (ref >60)
GFR, EST NON AFRICAN AMERICAN: 25 mL/min — AB (ref >60)
GLUCOSE: 123 mg/dL — AB (ref 65–99)
Potassium: 4.6 mmol/L (ref 3.5–5.1)
Sodium: 139 mmol/L (ref 135–145)
TOTAL PROTEIN: 5.4 g/dL — AB (ref 6.5–8.1)

## 2015-10-01 LAB — CBC
HEMATOCRIT: 29.3 % — AB (ref 39.0–52.0)
HEMOGLOBIN: 9.4 g/dL — AB (ref 13.0–17.0)
MCH: 30.3 pg (ref 26.0–34.0)
MCHC: 32.1 g/dL (ref 30.0–36.0)
MCV: 94.5 fL (ref 78.0–100.0)
Platelets: 82 10*3/uL — ABNORMAL LOW (ref 150–400)
RBC: 3.1 MIL/uL — ABNORMAL LOW (ref 4.22–5.81)
RDW: 14 % (ref 11.5–15.5)
WBC: 3.2 10*3/uL — AB (ref 4.0–10.5)

## 2015-10-01 LAB — SURGICAL PCR SCREEN
MRSA, PCR: NEGATIVE
STAPHYLOCOCCUS AUREUS: NEGATIVE

## 2015-10-01 LAB — PROTIME-INR
INR: 1.2
PROTHROMBIN TIME: 15.3 s — AB (ref 11.4–15.2)

## 2015-10-01 LAB — APTT: APTT: 35 s (ref 24–36)

## 2015-10-01 LAB — ABO/RH: ABO/RH(D): O POS

## 2015-10-01 NOTE — Progress Notes (Signed)
PCP is Dr. Napoleon Form @ [facility] @ [phone].  I have requested last office visit note. He did see her 2 weeks ago. Nephrologist is Dr. Alfonso Patten. [place]   LOV 07/18/2015 Cardio is Dr. Marylen Ponto.  I have instructed the patient to advise Dr. Marylen Ponto that he has been off plavix "for awhile".  Per the notes from [facility], there is no plavix on the med list and the patient states he's been off of it for 'long time'.  Unsure of time frame.  LOV 06/2015  Oncologist Dr. Lilian Kapur, had patient on Rituxan, and per the patient, received a total of 4 treatments, and has been stopped for 2 weeks now.  Patient states that it was Dr. Grayland Ormond who instructed him to stop his aspirin and fish oil

## 2015-10-01 NOTE — Pre-Procedure Instructions (Signed)
Zee Vill  10/01/2015      [facility] [place], [place] [address] Phone: [phone] Fax: [phone]    Your procedure is scheduled on  Wednesday, August 9th   Report to [facility] Admitting at 9:15 AM             (posted surgery time 12:15 pm - 3:30 pm.  Arrival time is per your surgeon's request)                                                                                               Call this number if you have problems the morning of surgery:  [phone]   Remember:  Do not eat food or drink liquids after midnight Tuesday.  Take these medicines the morning of surgery with A SIP OF WATER : Amlodipine, Metoprolol, Flomax             4-5 days prior to surgery, STOP taking any aspirin, herbal supplements, vitamins   Do not wear jewelry - no rings or watches.  Do not wear lotions or colognes.               Men may shave face and neck.  Do not bring valuables to the hospital.  [facility] is not responsible for any belongings or valuables.  Contacts, dentures or bridgework may not be worn into surgery.  Leave your suitcase in the car.  After surgery it may be brought to your room. For patients admitted to the hospital, discharge time will be determined by your treatment team.  Name and phone number of your driver:    Please read over the following fact sheets that you were given. Surgical Site Infection Prevention

## 2015-10-02 MED ORDER — CEFAZOLIN SODIUM-DEXTROSE 2-4 GM/100ML-% IV SOLN
2.0000 g | INTRAVENOUS | Status: DC
  Filled 2015-10-02: qty 100

## 2015-10-02 NOTE — H&P (Signed)
Robert Parks is an 75 y.o. male.   Chief Complaint: weakness in arms HPI: patient seen in my offfice with his family complaining of numbness in both arms and weakness up to the point that now he is using a canr. Had a cervical mri and was send to [place] for further treatment  Past Medical History:  Diagnosis Date  . Anginal pain ([place])    had angina 2-3 mths prior to visit  . Arthritis   . Chronic kidney disease    follwed by Nephrologist Dr Juanito Doom in [place], Stage 3  . Coronary artery disease   . DDD (degenerative disc disease), cervical   . Diabetes mellitus without complication (HCC)    DIET CONTROLLED   . Frequent urination   . GERD (gastroesophageal reflux disease)   . Gout   . Hyperlipidemia   . Hypertension   . Pancytopenia ([place])   . Shortness of breath dyspnea    at rest, mainly when he's active  . Stroke [facility])    ?? small stroke om 2017  came and went    Past Surgical History:  Procedure Laterality Date  . APPENDECTOMY    . BACK SURGERY  1980'S  . CARDIAC CATHETERIZATION     back in late 1990's [facility]  . COLONOSCOPY WITH PROPOFOL N/A 08/21/2014   Procedure: COLONOSCOPY WITH PROPOFOL;  Surgeon: Hulen Luster, MD;  Location: [facility] ENDOSCOPY;  Service: Gastroenterology;  Laterality: N/A;  . CORONARY ARTERY BYPASS GRAFT  1994  . EYE SURGERY Left    Cataract    No family history on file. Social History:  reports that he has quit smoking. His smoking use included Cigarettes. He quit after 30.00 years of use. He has never used smokeless tobacco. He reports that he does not drink alcohol or use drugs.  Allergies:  Allergies  Allergen Reactions  . No Known Allergies Other (See Comments)    No prescriptions prior to admission.    Results for orders placed or performed during the hospital encounter of 10/01/15 (from the past 48 hour(s))  APTT     Status: None   Collection Time: 10/01/15  2:28 PM  Result Value Ref Range   aPTT 35 24 - 36 seconds  CBC     Status:  Abnormal   Collection Time: 10/01/15  2:28 PM  Result Value Ref Range   WBC 3.2 (L) 4.0 - 10.5 K/uL   RBC 3.10 (L) 4.22 - 5.81 MIL/uL   Hemoglobin 9.4 (L) 13.0 - 17.0 g/dL   HCT 29.3 (L) 39.0 - 52.0 %   MCV 94.5 78.0 - 100.0 fL   MCH 30.3 26.0 - 34.0 pg   MCHC 32.1 30.0 - 36.0 g/dL   RDW 14.0 11.5 - 15.5 %   Platelets 82 (L) 150 - 400 K/uL    Comment: REPEATED TO VERIFY SPECIMEN CHECKED FOR CLOTS PLATELETS APPEAR DECREASED PLATELET COUNT CONFIRMED BY SMEAR   Comprehensive metabolic panel     Status: Abnormal   Collection Time: 10/01/15  2:28 PM  Result Value Ref Range   Sodium 139 135 - 145 mmol/L   Potassium 4.6 3.5 - 5.1 mmol/L   Chloride 107 101 - 111 mmol/L   CO2 24 22 - 32 mmol/L   Glucose, Bld 123 (H) 65 - 99 mg/dL   BUN 26 (H) 6 - 20 mg/dL   Creatinine, Ser 2.38 (H) 0.61 - 1.24 mg/dL   Calcium 9.2 8.9 - 10.3 mg/dL   Total Protein 5.4 (L)  6.5 - 8.1 g/dL   Albumin 3.4 (L) 3.5 - 5.0 g/dL   AST 28 15 - 41 U/L   ALT 17 17 - 63 U/L   Alkaline Phosphatase 80 38 - 126 U/L   Total Bilirubin 0.9 0.3 - 1.2 mg/dL   GFR calc non Af Amer 25 (L) >60 mL/min   GFR calc Af Amer 29 (L) >60 mL/min    Comment: (NOTE) The eGFR has been calculated using the CKD EPI equation. This calculation has not been validated in all clinical situations. eGFR's persistently <60 mL/min signify possible Chronic Kidney Disease.    Anion gap 8 5 - 15  Protime-INR     Status: Abnormal   Collection Time: 10/01/15  2:28 PM  Result Value Ref Range   Prothrombin Time 15.3 (H) 11.4 - 15.2 seconds   INR 1.20   Surgical pcr screen     Status: None   Collection Time: 10/01/15  2:28 PM  Result Value Ref Range   MRSA, PCR NEGATIVE NEGATIVE   Staphylococcus aureus NEGATIVE NEGATIVE    Comment:        The Xpert SA Assay (FDA approved for NASAL specimens in patients over 3 years of age), is one component of a comprehensive surveillance program.  Test performance has been validated by [facility] for  patients greater than or equal to 75 year old. It is not intended to diagnose infection nor to guide or monitor treatment.   Type and screen     Status: None   Collection Time: 10/01/15  2:30 PM  Result Value Ref Range   ABO/RH(D) O POS    Antibody Screen NEG    Sample Expiration 10/15/2015    Extend sample reason NO TRANSFUSIONS OR PREGNANCY IN THE PAST 3 MONTHS   ABO/Rh     Status: None   Collection Time: 10/01/15  2:30 PM  Result Value Ref Range   ABO/RH(D) O POS    No results found.  Review of Systems  Constitutional: Negative.   HENT: Positive for tinnitus.   Eyes: Negative.   Respiratory: Positive for shortness of breath.   Cardiovascular: Positive for leg swelling.  Gastrointestinal: Negative.   Genitourinary: Negative.   Skin: Negative.   Neurological: Positive for sensory change and focal weakness.  Endo/Heme/Allergies: Negative.   Psychiatric/Behavioral: Negative.     There were no vitals taken for this visit. Physical Exam  Constitutional: He appears well-developed.  HENT:  Head: Normocephalic.  Eyes: Pupils are equal, round, and reactive to light.  Neck: Decreased range of motion present.  Pain with mobility  Cardiovascular: Regular rhythm.   Respiratory: Breath sounds normal.  GI: Normal appearance.  Neurological: A cranial nerve deficit and sensory deficit is present. He displays a negative Romberg sign.  Reflex Scores:      Tricep reflexes are 1+ on the right side and 1+ on the left side.      Bicep reflexes are 1+ on the right side and 1+ on the left side.      Brachioradialis reflexes are 1+ on the right side.      Patellar reflexes are 1+ on the right side and 1+ on the left side.      Achilles reflexes are 1+ on the right side and 1+ on the left side. Weakness of both deltoids,biceps with normal triceps.  Skin: Skin is intact.     Assessment/Plan Cervical mri shows stenosis with changes in the cord from cervical 3 to 6. Patient to  go ahead  with decompression and fusion. He and his family are aware of risks and benefits   Floyce Stakes, MD 10/02/2015, 4:06 PM

## 2015-10-03 ENCOUNTER — Inpatient Hospital Stay (HOSPITAL_COMMUNITY): Payer: Medicare HMO | Admitting: Vascular Surgery

## 2015-10-03 ENCOUNTER — Encounter (HOSPITAL_COMMUNITY)
Admission: RE | Disposition: A | Discharge status: Home / Self Care | Payer: Self-pay | Source: Ambulatory Visit | Attending: Neurosurgery

## 2015-10-03 ENCOUNTER — Ambulatory Visit (HOSPITAL_COMMUNITY)
Admission: RE | Admit: 2015-10-03 | Discharge: 2015-10-03 | Disposition: A | Discharge status: Home / Self Care | Payer: Medicare HMO | Source: Ambulatory Visit | Attending: Neurosurgery | Admitting: Neurosurgery

## 2015-10-03 ENCOUNTER — Encounter (HOSPITAL_COMMUNITY): Payer: Self-pay | Admitting: *Deleted

## 2015-10-03 DIAGNOSIS — Z79899 Other long term (current) drug therapy: Secondary | ICD-10-CM | POA: Insufficient documentation

## 2015-10-03 DIAGNOSIS — Z01812 Encounter for preprocedural laboratory examination: Secondary | ICD-10-CM | POA: Insufficient documentation

## 2015-10-03 DIAGNOSIS — N183 Chronic kidney disease, stage 3 (moderate): Secondary | ICD-10-CM | POA: Insufficient documentation

## 2015-10-03 DIAGNOSIS — K219 Gastro-esophageal reflux disease without esophagitis: Secondary | ICD-10-CM | POA: Insufficient documentation

## 2015-10-03 DIAGNOSIS — E785 Hyperlipidemia, unspecified: Secondary | ICD-10-CM | POA: Insufficient documentation

## 2015-10-03 DIAGNOSIS — M4802 Spinal stenosis, cervical region: Secondary | ICD-10-CM | POA: Diagnosis not present

## 2015-10-03 DIAGNOSIS — D693 Immune thrombocytopenic purpura: Secondary | ICD-10-CM | POA: Insufficient documentation

## 2015-10-03 DIAGNOSIS — I2510 Atherosclerotic heart disease of native coronary artery without angina pectoris: Secondary | ICD-10-CM | POA: Insufficient documentation

## 2015-10-03 DIAGNOSIS — M1990 Unspecified osteoarthritis, unspecified site: Secondary | ICD-10-CM | POA: Insufficient documentation

## 2015-10-03 DIAGNOSIS — Z951 Presence of aortocoronary bypass graft: Secondary | ICD-10-CM | POA: Insufficient documentation

## 2015-10-03 DIAGNOSIS — Z01818 Encounter for other preprocedural examination: Secondary | ICD-10-CM | POA: Diagnosis not present

## 2015-10-03 DIAGNOSIS — Z0183 Encounter for blood typing: Secondary | ICD-10-CM | POA: Diagnosis not present

## 2015-10-03 DIAGNOSIS — Z7982 Long term (current) use of aspirin: Secondary | ICD-10-CM | POA: Insufficient documentation

## 2015-10-03 DIAGNOSIS — E119 Type 2 diabetes mellitus without complications: Secondary | ICD-10-CM | POA: Insufficient documentation

## 2015-10-03 DIAGNOSIS — Z87891 Personal history of nicotine dependence: Secondary | ICD-10-CM | POA: Insufficient documentation

## 2015-10-03 DIAGNOSIS — I129 Hypertensive chronic kidney disease with stage 1 through stage 4 chronic kidney disease, or unspecified chronic kidney disease: Secondary | ICD-10-CM | POA: Insufficient documentation

## 2015-10-03 LAB — PREPARE RBC (CROSSMATCH)

## 2015-10-03 SURGERY — CANCELLED PROCEDURE
Anesthesia: General

## 2015-10-03 MED ORDER — FENTANYL CITRATE (PF) 250 MCG/5ML IJ SOLN
INTRAMUSCULAR | Status: AC
  Filled 2015-10-03: qty 5

## 2015-10-03 MED ORDER — CHLORHEXIDINE GLUCONATE CLOTH 2 % EX PADS: 6.0000 | MEDICATED_PAD | Freq: Once | CUTANEOUS | Status: DC

## 2015-10-03 MED ORDER — LACTATED RINGERS IV SOLN: INTRAVENOUS | Status: DC | PRN

## 2015-10-03 MED ORDER — PROPOFOL 10 MG/ML IV BOLUS
INTRAVENOUS | Status: AC
  Filled 2015-10-03: qty 40

## 2015-10-03 MED ORDER — SODIUM CHLORIDE 0.9 % IV SOLN
INTRAVENOUS | Status: DC
  Administered 2015-10-03: 11:00:00 via INTRAVENOUS

## 2015-10-03 SURGICAL SUPPLY — 52 items
BENZOIN TINCTURE PRP APPL 2/3 (GAUZE/BANDAGES/DRESSINGS) IMPLANT
BLADE ULTRA TIP 2M (BLADE) IMPLANT
BNDG GAUZE ELAST 4 BULKY (GAUZE/BANDAGES/DRESSINGS) IMPLANT
BUR BARREL STRAIGHT FLUTE 4.0 (BURR) IMPLANT
BUR MATCHSTICK NEURO 3.0 LAGG (BURR) IMPLANT
CANISTER SUCT 3000ML PPV (MISCELLANEOUS) IMPLANT
CLOSURE WOUND 1/2 X4 (GAUZE/BANDAGES/DRESSINGS)
COVER MAYO STAND STRL (DRAPES) IMPLANT
DRAPE C-ARM 42X72 X-RAY (DRAPES) IMPLANT
DRAPE HALF SHEET 40X57 (DRAPES) IMPLANT
DRAPE LAPAROTOMY 100X72 PEDS (DRAPES) IMPLANT
DRAPE MICROSCOPE LEICA (MISCELLANEOUS) IMPLANT
DRAPE POUCH INSTRU U-SHP 10X18 (DRAPES) IMPLANT
DURAPREP 6ML APPLICATOR 50/CS (WOUND CARE) IMPLANT
ELECT REM PT RETURN 9FT ADLT (ELECTROSURGICAL)
ELECTRODE REM PT RTRN 9FT ADLT (ELECTROSURGICAL) IMPLANT
GAUZE SPONGE 4X4 12PLY STRL (GAUZE/BANDAGES/DRESSINGS) IMPLANT
GAUZE SPONGE 4X4 16PLY XRAY LF (GAUZE/BANDAGES/DRESSINGS) IMPLANT
GLOVE BIO SURGEON STRL SZ 6.5 (GLOVE) IMPLANT
GLOVE BIO SURGEON STRL SZ7 (GLOVE) IMPLANT
GLOVE BIO SURGEON STRL SZ7.5 (GLOVE) IMPLANT
GLOVE BIO SURGEON STRL SZ8 (GLOVE) IMPLANT
GLOVE BIO SURGEON STRL SZ8.5 (GLOVE) IMPLANT
GLOVE BIO SURGEONS STRL SZ 6.5 (GLOVE)
GLOVE BIOGEL M 8.0 STRL (GLOVE) IMPLANT
GLOVE ECLIPSE 7.0 STRL STRAW (GLOVE) ×3 IMPLANT
GLOVE ECLIPSE 7.5 STRL STRAW (GLOVE) IMPLANT
GLOVE ECLIPSE 8.0 STRL XLNG CF (GLOVE) IMPLANT
GLOVE ECLIPSE 8.5 STRL (GLOVE) IMPLANT
GLOVE INDICATOR 6.5 STRL GRN (GLOVE) IMPLANT
GLOVE INDICATOR 7.0 STRL GRN (GLOVE) IMPLANT
GLOVE INDICATOR 7.5 STRL GRN (GLOVE) IMPLANT
GOWN STRL REUS W/ TWL LRG LVL3 (GOWN DISPOSABLE) IMPLANT
GOWN STRL REUS W/ TWL XL LVL3 (GOWN DISPOSABLE) IMPLANT
GOWN STRL REUS W/TWL 2XL LVL3 (GOWN DISPOSABLE) IMPLANT
GOWN STRL REUS W/TWL LRG LVL3 (GOWN DISPOSABLE)
GOWN STRL REUS W/TWL XL LVL3 (GOWN DISPOSABLE)
HALTER HD/CHIN CERV TRACTION D (MISCELLANEOUS) IMPLANT
HEMOSTAT POWDER KIT SURGIFOAM (HEMOSTASIS) IMPLANT
KIT BASIN OR (CUSTOM PROCEDURE TRAY) IMPLANT
KIT ROOM TURNOVER OR (KITS) ×3 IMPLANT
NEEDLE SPNL 22GX3.5 QUINCKE BK (NEEDLE) IMPLANT
NS IRRIG 1000ML POUR BTL (IV SOLUTION) IMPLANT
PACK LAMINECTOMY NEURO (CUSTOM PROCEDURE TRAY) IMPLANT
RUBBERBAND STERILE (MISCELLANEOUS) IMPLANT
SPONGE INTESTINAL PEANUT (DISPOSABLE) IMPLANT
SPONGE SURGIFOAM ABS GEL 100 (HEMOSTASIS) IMPLANT
STRIP CLOSURE SKIN 1/2X4 (GAUZE/BANDAGES/DRESSINGS) IMPLANT
SUT VIC AB 3-0 SH 8-18 (SUTURE) IMPLANT
TOWEL OR 17X24 6PK STRL BLUE (TOWEL DISPOSABLE) IMPLANT
TOWEL OR 17X26 10 PK STRL BLUE (TOWEL DISPOSABLE) IMPLANT
WATER STERILE IRR 1000ML POUR (IV SOLUTION) IMPLANT

## 2015-10-03 NOTE — Progress Notes (Signed)
Patient was unable to lie any less than 45 degrees back elevation in operating room and noted severe dyspnea. Will delay surgery and optimize volume status prior to surgery. Discussed with Dr. Joya Salm, who agreed.   Tywone Bembenek [employer]

## 2015-10-03 NOTE — Anesthesia Procedure Notes (Deleted)
Anesthesia Procedure Note     

## 2015-10-03 NOTE — Transfer of Care (Signed)
Immediate Anesthesia Transfer of Care Note  Patient: Robert Parks  Procedure(s) Performed: * No procedures listed *  Patient Location: PACU and PACU VSS case cancelled in OR due to PTs shortness of breath worsening  Anesthesia Type:cancelled in OR due   Level of Consciousness: awake, alert  and oriented  Airway & Oxygen Therapy: Patient Spontanous Breathing  Post-op Assessment: Report given to RN  Post vital signs: Reviewed and stable  Last Vitals:  Vitals:   10/03/15 0928  BP: 98/60  Pulse: 94  Resp: 18  Temp: 36.6 C    Last Pain:  Vitals:   10/03/15 0928  TempSrc: Oral      Patients Stated Pain Goal: 6 ([id number])  Complications: No apparent anesthesia complications

## 2015-10-03 NOTE — Anesthesia Preprocedure Evaluation (Addendum)
Anesthesia Evaluation  Patient identified by MRN, date of birth, ID band Patient awake    Reviewed: Allergy & Precautions, NPO status , Patient's Chart, lab work & pertinent test results  Airway Mallampati: II  TM Distance: >3 FB Neck ROM: Full    Dental no notable dental hx.    Pulmonary shortness of breath and lying, former smoker,    Pulmonary exam normal        Cardiovascular hypertension, Pt. on medications + CAD (per cardiology, had narrowing/blockage in two vessels, which resolved without intervention. no more angina since starting medical treatment) and +CHF (diastolic dysfunction. requiring lasix )  Normal cardiovascular exam  04/19/15 Echo([facility]; Care Everywhere): Result Narrative: Left ventricular hypertrophy - mild Borderline left ventricular systolic function Mitral regurgitation - mild Dilated left atrium - mild Normal right ventricular systolic function Tricuspid regurgitation - moderate Dilated right atrium - mild Mildly elevated right atrial pressure Diastolic dysfunction - grade II (elevated filling pressures)   Neuro/Psych negative psych ROS   GI/Hepatic GERD  Medicated,  Endo/Other  diabetes, Type 2  Renal/GU CRFRenal disease     Musculoskeletal  (+) Arthritis , Osteoarthritis,    Abdominal Normal abdominal exam  (+)   Peds  Hematology  (+) anemia ,   Anesthesia Other Findings hyperlipidemia  Reproductive/Obstetrics                           Anesthesia Physical Anesthesia Plan  ASA: III  Anesthesia Plan: General   Post-op Pain Management:    Induction: Intravenous  Airway Management Planned: Oral ETT  Additional Equipment: Arterial line  Intra-op Plan:   Post-operative Plan: Extubation in OR  Informed Consent: I have reviewed the patients History and Physical, chart, labs and discussed the procedure including the risks, benefits and  alternatives for the proposed anesthesia with the patient or authorized representative who has indicated his/her understanding and acceptance.     Plan Discussed with:   Anesthesia Plan Comments:        Anesthesia Quick Evaluation Patient was unable to lie any less than 45 degrees back elevation in Operating Room and noted severe dyspnea. Will cancel case and optimize volume status prior to surgery. Discussed with Dr. Joya Salm, who agreed.

## 2015-10-04 NOTE — Op Note (Signed)
NAME:  Robert Parks, Robert Parks NO.:  [id number]  MEDICAL RECORD NO.:  [id number]  LOCATION:  [facility]                         FACILITY:  [facility]  PHYSICIAN:  Leeroy Cha, M.D.   DATE OF BIRTH:  [birth year]  DATE OF PROCEDURE:  10/03/2015 DATE OF DISCHARGE:                              OPERATIVE REPORT   [name] was to proceed today with a three-level anterior cervical diskectomy.  He was cleared by his medical doctor.  Nevertheless, when ___he was to be intubated the anesthesiologist found he was unable to  _______  breathe normally in prone position.  Because of that, the surgery was canceled. I talked to the family and they are going to take him back to his cardiologist and checked by him.  Once he is cleared, I will be more than happy to proceed.          ______________________________ Leeroy Cha, M.D.     EB/MEDQ  D:  10/03/2015  T:  10/03/2015  Job:  [id number]

## 2015-10-05 LAB — TYPE AND SCREEN
ABO/RH(D): O POS
ANTIBODY SCREEN: NEGATIVE
Unit division: 0
Unit division: 0

## 2015-10-09 NOTE — Progress Notes (Signed)
[place]  Telephone:([phone]) [phone] Fax:(336) [phone]  ID: Robert Parks OB: 1940/08/22  MR#: [id number]  IRC#:[id number]  Patient Care Team: [facility] as PCP - General (General Practice)  CHIEF COMPLAINT: ITP  INTERVAL HISTORY: Patient returns to clinic today for further evaluation and laboratory work. He was unable to have his next surgery secondary to cardiac issues. He continues to have neck pain, but otherwise feels well. He does not complain of weakness or fatigue today. He continues to have occasional dyspnea on exertion. He denies any easy bleeding or bruising. He denies any recent fevers or illnesses. He has no neurologic complaints. He has a good appetite and denies weight loss. He denies any chest pain. He denies any nausea, vomiting, constipation, or diarrhea. He has no urinary complaints. Patient offers no further specific complaints today.  REVIEW OF SYSTEMS:   Review of Systems  Constitutional: Negative for fever, malaise/fatigue and weight loss.  Respiratory: Positive for shortness of breath. Negative for cough.   Cardiovascular: Negative.  Negative for chest pain.  Gastrointestinal: Negative.  Negative for blood in stool and melena.  Musculoskeletal: Positive for neck pain.  Neurological: Negative for weakness.  Endo/Heme/Allergies: Does not bruise/bleed easily.  Psychiatric/Behavioral: Negative.     As per HPI. Otherwise, a complete review of systems is negatve.  PAST MEDICAL HISTORY: Past Medical History:  Diagnosis Date  . Anginal pain ([place])    had angina 2-3 mths prior to visit  . Arthritis   . Chronic kidney disease    follwed by Nephrologist Dr Juanito Doom in [place], Stage 3  . Coronary artery disease   . DDD (degenerative disc disease), cervical   . Diabetes mellitus without complication (HCC)    DIET CONTROLLED   . Frequent urination   . GERD (gastroesophageal reflux disease)   . Gout   . Hyperlipidemia   .  Hypertension   . Pancytopenia ([place])   . Shortness of breath dyspnea    at rest, mainly when he's active  . Stroke [facility])    ?? small stroke om 2017  came and went    PAST SURGICAL HISTORY: Past Surgical History:  Procedure Laterality Date  . APPENDECTOMY    . BACK SURGERY  1980'S  . CARDIAC CATHETERIZATION     back in late 1990's [facility]  . COLONOSCOPY WITH PROPOFOL N/A 08/21/2014   Procedure: COLONOSCOPY WITH PROPOFOL;  Surgeon: Hulen Luster, MD;  Location: [facility] ENDOSCOPY;  Service: Gastroenterology;  Laterality: N/A;  . CORONARY ARTERY BYPASS GRAFT  1994  . EYE SURGERY Left    Cataract    FAMILY HISTORY: Reviewed and unchanged. No reported history of malignancy or chronic disease.     ADVANCED DIRECTIVES:    HEALTH MAINTENANCE: Social History  Substance Use Topics  . Smoking status: Former Smoker    Years: 30.00    Types: Cigarettes  . Smokeless tobacco: Never Used     Comment: quit in his 64's  . Alcohol use No     Colonoscopy:  PAP:  Bone density:  Lipid panel:  Allergies  Allergen Reactions  . No Known Allergies Other (See Comments)    No current facility-administered medications for this visit.    No current outpatient prescriptions on file.   Facility-Administered Medications Ordered in Other Visits  Medication Dose Route Frequency Provider Last Rate Last Dose  . acetaminophen (TYLENOL) tablet 650 mg  650 mg Oral Q6H PRN Demetrios Loll, MD       Or  .  acetaminophen (TYLENOL) suppository 650 mg  650 mg Rectal Q6H PRN Demetrios Loll, MD      . albuterol (PROVENTIL) (2.5 MG/3ML) 0.083% nebulizer solution 2.5 mg  2.5 mg Nebulization Q2H PRN Demetrios Loll, MD      . aspirin EC tablet 81 mg  81 mg Oral Daily Demetrios Loll, MD   81 mg at 10/14/15 0916  . atorvastatin (LIPITOR) tablet 10 mg  10 mg Oral Daily Demetrios Loll, MD   10 mg at 10/14/15 1835  . carvedilol (COREG) tablet 3.125 mg  3.125 mg Oral BID WC Gladstone Lighter, MD   3.125 mg at 10/14/15 1834  . furosemide  (LASIX) injection 40 mg  40 mg Intravenous Q8H Gladstone Lighter, MD   40 mg at 10/15/15 0520  . heparin injection 5,000 Units  5,000 Units Subcutaneous Q8H Gladstone Lighter, MD   5,000 Units at 10/15/15 0520  . HYDROcodone-acetaminophen (NORCO/VICODIN) 5-325 MG per tablet 1-2 tablet  1-2 tablet Oral Q4H PRN Demetrios Loll, MD      . insulin aspart (novoLOG) injection 0-5 Units  0-5 Units Subcutaneous QHS Demetrios Loll, MD      . insulin aspart (novoLOG) injection 0-9 Units  0-9 Units Subcutaneous TID WC Demetrios Loll, MD      . nitroGLYCERIN (NITROSTAT) SL tablet 0.4 mg  0.4 mg Sublingual Q5 min PRN Demetrios Loll, MD      . ondansetron [facility]) tablet 4 mg  4 mg Oral Q6H PRN Demetrios Loll, MD       Or  . ondansetron [facility]) injection 4 mg  4 mg Intravenous Q6H PRN Demetrios Loll, MD      . potassium chloride (KLOR-CON) packet 20 mEq  20 mEq Oral Daily Gladstone Lighter, MD   20 mEq at 10/14/15 0916  . ranolazine (RANEXA) 12 hr tablet 500 mg  500 mg Oral BID Bettey Costa, MD   500 mg at 10/14/15 2137  . sodium chloride flush (NS) 0.9 % injection 3 mL  3 mL Intravenous Q12H Sital Mody, MD      . sodium chloride flush (NS) 0.9 % injection 3 mL  3 mL Intravenous PRN Bettey Costa, MD   3 mL at 10/15/15 0522  . tamsulosin (FLOMAX) capsule 0.4 mg  0.4 mg Oral Daily Demetrios Loll, MD   0.4 mg at 10/14/15 0917    OBJECTIVE: Vitals:   10/11/15 1129  BP: 102/61  Pulse: 66  Temp: 97.2 F (36.2 C)     Body mass index is 24.4 kg/m.    ECOG FS:0 - Asymptomatic  General: Well-developed, well-nourished, no acute distress. Eyes: Pink conjunctiva, anicteric sclera. Lungs: Clear to auscultation bilaterally. Heart: Regular rate and rhythm. No rubs, murmurs, or gallops. Abdomen: Soft, nontender, nondistended. No organomegaly noted, normoactive bowel sounds. Musculoskeletal: No edema, cyanosis, or clubbing. Neuro: Alert, answering all questions appropriately. Cranial nerves grossly intact. Skin: No rashes or petechiae noted. Psych:  Normal affect.   LAB RESULTS:  Lab Results  Component Value Date   NA 138 10/15/2015   K 3.6 10/15/2015   CL 104 10/15/2015   CO2 22 10/15/2015   GLUCOSE 114 (H) 10/15/2015   BUN 60 (H) 10/15/2015   CREATININE 3.71 (H) 10/15/2015   CALCIUM 8.7 (L) 10/15/2015   PROT 6.3 (L) 10/11/2015   ALBUMIN 4.0 10/11/2015   AST 26 10/11/2015   ALT 15 (L) 10/11/2015   ALKPHOS 72 10/11/2015   BILITOT 1.0 10/11/2015   GFRNONAA 15 (L) 10/15/2015   GFRAA 17 (L) 10/15/2015  Lab Results  Component Value Date   WBC 2.3 (L) 10/13/2015   NEUTROABS 2.0 10/11/2015   HGB 9.4 (L) 10/13/2015   HCT 27.8 (L) 10/13/2015   MCV 96.2 10/13/2015   PLT 56 (L) 10/13/2015   Lab Results  Component Value Date   IRON 63 07/11/2015   TIBC 274 07/11/2015   IRONPCTSAT 23 07/11/2015    Lab Results  Component Value Date   FERRITIN 107 07/11/2015   Lab Results  Component Value Date   TOTALPROTELP 6.1 07/11/2015   ALBUMINELP 3.7 07/11/2015   A1GS 0.2 07/11/2015   A2GS 0.5 07/11/2015   BETS 0.7 07/11/2015   GAMS 0.9 07/11/2015   MSPIKE Not Observed 07/11/2015   SPEI Comment 07/11/2015     STUDIES: Dg Chest 2 View  Result Date: 10/11/2015 CLINICAL DATA:  Short of breath and leg swelling. Concern for heart failure or pneumonia EXAM: CHEST  2 VIEW COMPARISON:  None. FINDINGS: Postop CABG. Cardiac enlargement. Pulmonary vascular congestion with mild interstitial edema. Small bilateral pleural effusions. Bibasilar atelectasis. IMPRESSION: Congestive heart failure with mild edema and small bilateral pleural effusions. Bibasilar atelectasis. Electronically Signed   By: Franchot Gallo M.D.   On: 10/11/2015 16:06   US Renal  Result Date: 10/12/2015 CLINICAL DATA:  Acute renal failure EXAM: RENAL / URINARY TRACT ULTRASOUND COMPLETE COMPARISON:  Abdominal ultrasound 05/07/2006 FINDINGS: Right Kidney: Length: 9.6 cm. Echogenicity within normal limits. No mass or hydronephrosis visualized. Left Kidney: Length:  Measures 9 cm in length. There is normal echogenicity. No hydronephrosis. There is a hyperechoic cyst in lower pole of the left kidney measures 1.9 x 1.8 cm. Bladder: Appears normal for degree of bladder distention. Small amount of abdominal ascites.  Left pleural effusion is noted. IMPRESSION: 1. No hydronephrosis. No renal calculi. Again noted a cyst in lower pole of the left kidney measures 1.8 x 1.9 cm. On the prior exam measures 1.6 x 1.5 cm. Unremarkable urinary bladder. Small abdominal ascites. Left pleural effusion is noted. Electronically Signed   By: Lahoma Crocker M.D.   On: 10/12/2015 16:09    ASSESSMENT: ITP  PLAN:    1. ITP:  Patient's platelet count has trended back down. His last infusion of Rituxan was on September 06, 2015. Will hold off any additional treatment at this time until patient's cardiac issues have been resolved and his surgery is rescheduled. Patient can be retreated with retraction or possibly with Nplate to achieve a more durable response. Goal platelet count will be greater than 100 in order to proceed with his neck surgery. Patient does not require bone marrow biopsy, but may need one in the future.  Return to clinic in 4 weeks for laboratory work and then in 8 weeks for laboratory work and further evaluation. 2. Anemia: Likely secondary to chronic renal insufficiency. Iron stores and the remainder of his laboratory work are within normal limits.  If patient's hemoglobin remained persistently below 10.0, he may benefit from Procrit in the future. 3. Leukopenia: Resolved.  4. Chronic renal insufficiency: Unclear patient's baseline, monitor.  Patient expressed understanding and was in agreement with this plan. He also understands that He can call clinic at any time with any questions, concerns, or complaints.    Lloyd Huger, MD   10/15/2015 8:24 AM

## 2015-10-11 ENCOUNTER — Emergency Department: Payer: Medicare HMO

## 2015-10-11 ENCOUNTER — Other Ambulatory Visit: Payer: Self-pay

## 2015-10-11 ENCOUNTER — Inpatient Hospital Stay (HOSPITAL_BASED_OUTPATIENT_CLINIC_OR_DEPARTMENT_OTHER): Payer: Medicare HMO | Admitting: Oncology

## 2015-10-11 ENCOUNTER — Inpatient Hospital Stay: Payer: Medicare HMO | Attending: Oncology

## 2015-10-11 ENCOUNTER — Inpatient Hospital Stay
Admission: EM | Admit: 2015-10-11 | Discharge: 2015-10-16 | DRG: 291 | Disposition: A | Discharge status: Home / Self Care | Payer: Medicare HMO | Attending: Internal Medicine | Admitting: Internal Medicine

## 2015-10-11 VITALS — BP 102/61 | HR 66 | Temp 97.2°F | Wt 160.5 lb

## 2015-10-11 DIAGNOSIS — M542 Cervicalgia: Secondary | ICD-10-CM | POA: Insufficient documentation

## 2015-10-11 DIAGNOSIS — N183 Chronic kidney disease, stage 3 (moderate): Secondary | ICD-10-CM | POA: Diagnosis present

## 2015-10-11 DIAGNOSIS — R350 Frequency of micturition: Secondary | ICD-10-CM | POA: Diagnosis not present

## 2015-10-11 DIAGNOSIS — I129 Hypertensive chronic kidney disease with stage 1 through stage 4 chronic kidney disease, or unspecified chronic kidney disease: Secondary | ICD-10-CM | POA: Diagnosis not present

## 2015-10-11 DIAGNOSIS — E785 Hyperlipidemia, unspecified: Secondary | ICD-10-CM | POA: Diagnosis present

## 2015-10-11 DIAGNOSIS — Z8673 Personal history of transient ischemic attack (TIA), and cerebral infarction without residual deficits: Secondary | ICD-10-CM

## 2015-10-11 DIAGNOSIS — K219 Gastro-esophageal reflux disease without esophagitis: Secondary | ICD-10-CM | POA: Diagnosis present

## 2015-10-11 DIAGNOSIS — D649 Anemia, unspecified: Secondary | ICD-10-CM | POA: Insufficient documentation

## 2015-10-11 DIAGNOSIS — D693 Immune thrombocytopenic purpura: Secondary | ICD-10-CM | POA: Diagnosis present

## 2015-10-11 DIAGNOSIS — D61818 Other pancytopenia: Secondary | ICD-10-CM | POA: Diagnosis present

## 2015-10-11 DIAGNOSIS — Z8249 Family history of ischemic heart disease and other diseases of the circulatory system: Secondary | ICD-10-CM

## 2015-10-11 DIAGNOSIS — I5023 Acute on chronic systolic (congestive) heart failure: Secondary | ICD-10-CM | POA: Diagnosis present

## 2015-10-11 DIAGNOSIS — E119 Type 2 diabetes mellitus without complications: Secondary | ICD-10-CM | POA: Insufficient documentation

## 2015-10-11 DIAGNOSIS — M109 Gout, unspecified: Secondary | ICD-10-CM | POA: Diagnosis not present

## 2015-10-11 DIAGNOSIS — N179 Acute kidney failure, unspecified: Secondary | ICD-10-CM | POA: Diagnosis present

## 2015-10-11 DIAGNOSIS — I509 Heart failure, unspecified: Secondary | ICD-10-CM

## 2015-10-11 DIAGNOSIS — Z79899 Other long term (current) drug therapy: Secondary | ICD-10-CM | POA: Diagnosis not present

## 2015-10-11 DIAGNOSIS — M5030 Other cervical disc degeneration, unspecified cervical region: Secondary | ICD-10-CM | POA: Diagnosis present

## 2015-10-11 DIAGNOSIS — L8992 Pressure ulcer of unspecified site, stage 2: Secondary | ICD-10-CM | POA: Diagnosis present

## 2015-10-11 DIAGNOSIS — Z794 Long term (current) use of insulin: Secondary | ICD-10-CM | POA: Insufficient documentation

## 2015-10-11 DIAGNOSIS — M129 Arthropathy, unspecified: Secondary | ICD-10-CM | POA: Diagnosis not present

## 2015-10-11 DIAGNOSIS — R748 Abnormal levels of other serum enzymes: Secondary | ICD-10-CM | POA: Diagnosis present

## 2015-10-11 DIAGNOSIS — R338 Other retention of urine: Secondary | ICD-10-CM | POA: Diagnosis present

## 2015-10-11 DIAGNOSIS — M4802 Spinal stenosis, cervical region: Secondary | ICD-10-CM | POA: Diagnosis present

## 2015-10-11 DIAGNOSIS — I209 Angina pectoris, unspecified: Secondary | ICD-10-CM | POA: Insufficient documentation

## 2015-10-11 DIAGNOSIS — E875 Hyperkalemia: Secondary | ICD-10-CM | POA: Diagnosis present

## 2015-10-11 DIAGNOSIS — R7989 Other specified abnormal findings of blood chemistry: Secondary | ICD-10-CM

## 2015-10-11 DIAGNOSIS — E1122 Type 2 diabetes mellitus with diabetic chronic kidney disease: Secondary | ICD-10-CM | POA: Diagnosis present

## 2015-10-11 DIAGNOSIS — R0602 Shortness of breath: Secondary | ICD-10-CM

## 2015-10-11 DIAGNOSIS — I2510 Atherosclerotic heart disease of native coronary artery without angina pectoris: Secondary | ICD-10-CM | POA: Diagnosis present

## 2015-10-11 DIAGNOSIS — Z7982 Long term (current) use of aspirin: Secondary | ICD-10-CM

## 2015-10-11 DIAGNOSIS — M1990 Unspecified osteoarthritis, unspecified site: Secondary | ICD-10-CM | POA: Diagnosis present

## 2015-10-11 DIAGNOSIS — Z9889 Other specified postprocedural states: Secondary | ICD-10-CM | POA: Diagnosis not present

## 2015-10-11 DIAGNOSIS — I130 Hypertensive heart and chronic kidney disease with heart failure and stage 1 through stage 4 chronic kidney disease, or unspecified chronic kidney disease: Secondary | ICD-10-CM | POA: Diagnosis not present

## 2015-10-11 DIAGNOSIS — Z9049 Acquired absence of other specified parts of digestive tract: Secondary | ICD-10-CM

## 2015-10-11 DIAGNOSIS — N401 Enlarged prostate with lower urinary tract symptoms: Secondary | ICD-10-CM | POA: Diagnosis present

## 2015-10-11 DIAGNOSIS — Z809 Family history of malignant neoplasm, unspecified: Secondary | ICD-10-CM | POA: Diagnosis not present

## 2015-10-11 DIAGNOSIS — R1310 Dysphagia, unspecified: Secondary | ICD-10-CM | POA: Diagnosis present

## 2015-10-11 DIAGNOSIS — Z87891 Personal history of nicotine dependence: Secondary | ICD-10-CM | POA: Diagnosis not present

## 2015-10-11 DIAGNOSIS — Z9842 Cataract extraction status, left eye: Secondary | ICD-10-CM | POA: Diagnosis not present

## 2015-10-11 DIAGNOSIS — R600 Localized edema: Secondary | ICD-10-CM

## 2015-10-11 DIAGNOSIS — R778 Other specified abnormalities of plasma proteins: Secondary | ICD-10-CM

## 2015-10-11 DIAGNOSIS — L8990 Pressure ulcer of unspecified site, unspecified stage: Secondary | ICD-10-CM | POA: Insufficient documentation

## 2015-10-11 DIAGNOSIS — Z951 Presence of aortocoronary bypass graft: Secondary | ICD-10-CM

## 2015-10-11 LAB — COMPREHENSIVE METABOLIC PANEL
ALT: 15 U/L — AB (ref 17–63)
ANION GAP: 9 (ref 5–15)
AST: 26 U/L (ref 15–41)
Albumin: 4 g/dL (ref 3.5–5.0)
Alkaline Phosphatase: 72 U/L (ref 38–126)
BUN: 71 mg/dL — ABNORMAL HIGH (ref 6–20)
CALCIUM: 9 mg/dL (ref 8.9–10.3)
CHLORIDE: 108 mmol/L (ref 101–111)
CO2: 22 mmol/L (ref 22–32)
CREATININE: 4.88 mg/dL — AB (ref 0.61–1.24)
GFR, EST AFRICAN AMERICAN: 12 mL/min — AB (ref >60)
GFR, EST NON AFRICAN AMERICAN: 11 mL/min — AB (ref >60)
Glucose, Bld: 96 mg/dL (ref 65–99)
Potassium: 5.3 mmol/L — ABNORMAL HIGH (ref 3.5–5.1)
SODIUM: 139 mmol/L (ref 135–145)
Total Bilirubin: 1 mg/dL (ref 0.3–1.2)
Total Protein: 6.3 g/dL — ABNORMAL LOW (ref 6.5–8.1)

## 2015-10-11 LAB — CBC WITH DIFFERENTIAL/PLATELET
Basophils Absolute: 0 10*3/uL (ref 0–0.1)
Basophils Absolute: 0 10*3/uL (ref 0–0.1)
Basophils Relative: 1 %
EOS ABS: 0.1 10*3/uL (ref 0–0.7)
EOS ABS: 0 10*3/uL (ref 0–0.7)
HCT: 27.5 % — ABNORMAL LOW (ref 40.0–52.0)
HCT: 29.1 % — ABNORMAL LOW (ref 40.0–52.0)
HEMOGLOBIN: 9.2 g/dL — AB (ref 13.0–18.0)
Hemoglobin: 9.6 g/dL — ABNORMAL LOW (ref 13.0–18.0)
LYMPHS ABS: 0.3 10*3/uL — AB (ref 1.0–3.6)
Lymphocytes Relative: 12 %
Lymphs Abs: 0.4 10*3/uL — ABNORMAL LOW (ref 1.0–3.6)
MCH: 32 pg (ref 26.0–34.0)
MCH: 31.7 pg (ref 26.0–34.0)
MCHC: 33.6 g/dL (ref 32.0–36.0)
MCHC: 32.9 g/dL (ref 32.0–36.0)
MCV: 95.3 fL (ref 80.0–100.0)
MCV: 96.3 fL (ref 80.0–100.0)
MONO ABS: 0.3 10*3/uL (ref 0.2–1.0)
Monocytes Absolute: 0.3 10*3/uL (ref 0.2–1.0)
Neutro Abs: 2.2 10*3/uL (ref 1.4–6.5)
Neutro Abs: 2 10*3/uL (ref 1.4–6.5)
Neutrophils Relative %: 74 %
PLATELETS: 66 10*3/uL — AB (ref 150–440)
PLATELETS: 66 10*3/uL — AB (ref 150–440)
RBC: 2.88 MIL/uL — ABNORMAL LOW (ref 4.40–5.90)
RBC: 3.02 MIL/uL — AB (ref 4.40–5.90)
RDW: 14.9 % — AB (ref 11.5–14.5)
RDW: 15.1 % — ABNORMAL HIGH (ref 11.5–14.5)
WBC: 2.9 10*3/uL — ABNORMAL LOW (ref 3.8–10.6)
WBC: 2.6 10*3/uL — ABNORMAL LOW (ref 3.8–10.6)

## 2015-10-11 LAB — TROPONIN I
TROPONIN I: 0.15 ng/mL — AB (ref <0.03)
TROPONIN I: 0.13 ng/mL — AB (ref <0.03)

## 2015-10-11 LAB — GLUCOSE, CAPILLARY: GLUCOSE-CAPILLARY: 104 mg/dL — AB (ref 65–99)

## 2015-10-11 LAB — BRAIN NATRIURETIC PEPTIDE

## 2015-10-11 MED ORDER — TAMSULOSIN HCL 0.4 MG PO CAPS
0.4000 mg | ORAL_CAPSULE | Freq: Every day | ORAL | Status: DC
  Administered 2015-10-13 – 2015-10-16 (×4): 0.4 mg via ORAL
  Filled 2015-10-11 (×5): qty 1

## 2015-10-11 MED ORDER — ATORVASTATIN CALCIUM 10 MG PO TABS
10.0000 mg | ORAL_TABLET | Freq: Every day | ORAL | Status: DC
Start: 2015-10-11 — End: 2015-10-16
  Administered 2015-10-12 – 2015-10-15 (×4): 10 mg via ORAL
  Filled 2015-10-11 (×4): qty 1

## 2015-10-11 MED ORDER — ONDANSETRON HCL 4 MG PO TABS: 4.0000 mg | ORAL_TABLET | Freq: Four times a day (QID) | ORAL | Status: DC | PRN

## 2015-10-11 MED ORDER — ASPIRIN EC 81 MG PO TBEC
81.0000 mg | DELAYED_RELEASE_TABLET | Freq: Every day | ORAL | Status: DC
Start: 2015-10-12 — End: 2015-10-16
  Administered 2015-10-12 – 2015-10-16 (×5): 81 mg via ORAL
  Filled 2015-10-11 (×5): qty 1

## 2015-10-11 MED ORDER — FUROSEMIDE 10 MG/ML IJ SOLN
40.0000 mg | Freq: Two times a day (BID) | INTRAMUSCULAR | Status: DC
Start: 2015-10-11 — End: 2015-10-13
  Administered 2015-10-12 – 2015-10-13 (×3): 40 mg via INTRAVENOUS
  Filled 2015-10-11 (×3): qty 4

## 2015-10-11 MED ORDER — HEPARIN SODIUM (PORCINE) 5000 UNIT/ML IJ SOLN
5000.0000 [IU] | Freq: Three times a day (TID) | INTRAMUSCULAR | Status: DC
  Administered 2015-10-11 – 2015-10-12 (×2): 5000 [IU] via SUBCUTANEOUS
  Filled 2015-10-11 (×2): qty 1

## 2015-10-11 MED ORDER — POTASSIUM CHLORIDE 20 MEQ PO PACK
20.0000 meq | PACK | Freq: Every day | ORAL | Status: DC
  Administered 2015-10-12 – 2015-10-13 (×2): 20 meq via ORAL
  Filled 2015-10-11 (×2): qty 1

## 2015-10-11 MED ORDER — ACETAMINOPHEN 650 MG RE SUPP: 650.0000 mg | Freq: Four times a day (QID) | RECTAL | Status: DC | PRN

## 2015-10-11 MED ORDER — NITROGLYCERIN 0.4 MG SL SUBL: 0.4000 mg | SUBLINGUAL_TABLET | SUBLINGUAL | Status: DC | PRN

## 2015-10-11 MED ORDER — RANOLAZINE ER 500 MG PO TB12
1000.0000 mg | ORAL_TABLET | Freq: Two times a day (BID) | ORAL | Status: DC
  Administered 2015-10-12: 1000 mg via ORAL
  Filled 2015-10-11 (×2): qty 2

## 2015-10-11 MED ORDER — HYDROCODONE-ACETAMINOPHEN 5-325 MG PO TABS
1.0000 | ORAL_TABLET | ORAL | Status: DC | PRN
Start: 2015-10-11 — End: 2015-10-16
  Administered 2015-10-15: 2 via ORAL
  Filled 2015-10-11: qty 2

## 2015-10-11 MED ORDER — INSULIN ASPART 100 UNIT/ML ~~LOC~~ SOLN
0.0000 [IU] | Freq: Three times a day (TID) | SUBCUTANEOUS | Status: DC
  Administered 2015-10-15 – 2015-10-16 (×4): 1 [IU] via SUBCUTANEOUS
  Filled 2015-10-11 (×3): qty 1

## 2015-10-11 MED ORDER — FUROSEMIDE 10 MG/ML IJ SOLN
60.0000 mg | Freq: Once | INTRAMUSCULAR | Status: AC
  Administered 2015-10-11: 60 mg via INTRAVENOUS
  Filled 2015-10-11: qty 8

## 2015-10-11 MED ORDER — ONDANSETRON HCL 4 MG/2ML IJ SOLN: 4.0000 mg | Freq: Four times a day (QID) | INTRAMUSCULAR | Status: DC | PRN

## 2015-10-11 MED ORDER — POTASSIUM CHLORIDE 20 MEQ PO PACK
20.0000 meq | PACK | Freq: Every day | ORAL | 3 refills | Status: DC
Start: 2015-10-11 — End: 2015-10-16

## 2015-10-11 MED ORDER — ACETAMINOPHEN 325 MG PO TABS: 650.0000 mg | ORAL_TABLET | Freq: Four times a day (QID) | ORAL | Status: DC | PRN

## 2015-10-11 MED ORDER — INSULIN ASPART 100 UNIT/ML ~~LOC~~ SOLN: 0.0000 [IU] | Freq: Every day | SUBCUTANEOUS | Status: DC

## 2015-10-11 MED ORDER — ALBUTEROL SULFATE (2.5 MG/3ML) 0.083% IN NEBU: 2.5000 mg | INHALATION_SOLUTION | RESPIRATORY_TRACT | Status: DC | PRN

## 2015-10-11 MED ORDER — ASPIRIN 81 MG PO CHEW
324.0000 mg | CHEWABLE_TABLET | Freq: Once | ORAL | Status: AC
  Administered 2015-10-11: 324 mg via ORAL
  Filled 2015-10-11: qty 4

## 2015-10-11 NOTE — ED Notes (Signed)
Pt resting in bed, resp even and unlabored, family at bedside 

## 2015-10-11 NOTE — ED Notes (Signed)
EDP at bedside  

## 2015-10-11 NOTE — ED Triage Notes (Addendum)
Pt c/o increased SOB with BL LE swelling, pt was seen at Cancer center today for low platelets and possible transfusions. Pt had labs drawn at [facility] center today.

## 2015-10-11 NOTE — ED Provider Notes (Signed)
[facility] Emergency Department Provider Note    First MD Initiated Contact with Patient 10/11/15 1435     (approximate)  I have reviewed the triage vital signs and the nursing notes.   HISTORY  Chief Complaint Leg Swelling and Shortness of Breath    HPI Robert Parks is a 75 y.o. 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 The chest pain or pressure. States that he's had decreased appetite secondary to being on Rituxan. Denies any cough.  Denies any pleuritic chest pain. No previous history of DVT. Denies any fevers, nausea or vomiting. He has been taking 40 mg of Lasix daily without improvement. He does not use compression stockings.   Past Medical History:  Diagnosis Date  . Anginal pain ([place])    had angina 2-3 mths prior to visit  . Arthritis   . Chronic kidney disease    follwed by Nephrologist Dr Juanito Doom in [place], Stage 3  . Coronary artery disease   . DDD (degenerative disc disease), cervical   . Diabetes mellitus without complication (HCC)    DIET CONTROLLED   . Frequent urination   . GERD (gastroesophageal reflux disease)   . Gout   . Hyperlipidemia   . Hypertension   . Pancytopenia ([place])   . Shortness of breath dyspnea    at rest, mainly when he's active  . Stroke [facility])    ?? small stroke om 2017  came and went    Patient Active Problem List   Diagnosis Date Noted  . ITP (idiopathic thrombocytopenic purpura) 08/06/2015  . Chronic diastolic heart failure ([place]) 07/18/2015  . Spinal stenosis of cervical region 07/18/2015  . Left-sided weakness 05/16/2015  . Neck pain 05/16/2015  . CKD (chronic kidney disease) stage 3, GFR 30-59 ml/min 08/25/2013  . Benign localized prostatic hyperplasia with lower urinary tract symptoms (LUTS) 11/05/2011  .  Chronic prostatitis 11/05/2011  . Elevated prostate specific antigen (PSA) 11/05/2011  . ED (erectile dysfunction) of organic origin 11/05/2011  . Intermediate coronary syndrome ([place]) 05/21/2011  . Chronic kidney disease, stage III (moderate) 04/20/2011  . Coronary artery disease 04/20/2011  . Gout 04/20/2011  . Hypertension, benign 04/20/2011  . Type II diabetes mellitus ([place]) 04/20/2011    Past Surgical History:  Procedure Laterality Date  . APPENDECTOMY    . BACK SURGERY  1980'S  . CARDIAC CATHETERIZATION     back in late 1990's [facility]  . COLONOSCOPY WITH PROPOFOL N/A 08/21/2014   Procedure: COLONOSCOPY WITH PROPOFOL;  Surgeon: Hulen Luster, MD;  Location: [facility] ENDOSCOPY;  Service: Gastroenterology;  Laterality: N/A;  . CORONARY ARTERY BYPASS GRAFT  1994  . EYE SURGERY Left    Cataract    Prior to Admission medications   Medication Sig Start Date End Date Taking? Authorizing Provider  acetaminophen (TYLENOL) 650 MG CR tablet Take 650 mg by mouth.    Historical Provider, MD  amLODipine (NORVASC) 10 MG tablet Take 10 mg by mouth daily.    Historical Provider, MD  aspirin EC 81 MG tablet Take 81 mg by mouth. 08/13/12   Historical Provider, MD  atorvastatin (LIPITOR) 10 MG tablet Take 10 mg by mouth. 10/01/15   Historical Provider, MD  colchicine 0.6 MG tablet Take 0.6 mg by mouth. 11/11/11   Historical Provider, MD  furosemide (LASIX) 20 MG tablet Take 40 mg by mouth daily.    Historical Provider, MD  hydrALAZINE (APRESOLINE) 100 MG tablet Take 100 mg by mouth 2 (two) times daily.    Historical Provider, MD  isosorbide mononitrate (IMDUR) 120 MG 24 hr tablet Take 240 mg by mouth. 05/30/15   Historical Provider, MD  metoprolol succinate (TOPROL-XL) 100 MG 24 hr tablet Take 200 mg by mouth. 10/01/15   Historical Provider, MD  nitroGLYCERIN (NITROSTAT) 0.4 MG SL tablet Place 0.4 mg under the tongue. 11/27/14   Historical Provider, MD  potassium chloride (KLOR-CON) 20 MEQ packet Take 20 mEq  by mouth daily. 10/11/15   Lloyd Huger, MD  ranolazine (RANEXA) 500 MG 12 hr tablet Take 1,000 mg by mouth. 10/01/15   Historical Provider, MD  tamsulosin (FLOMAX) 0.4 MG CAPS capsule Take 0.4 mg by mouth. 11/27/14   Historical Provider, MD    Allergies No known allergies  No family history on file.  Social History Social History  Substance Use Topics  . Smoking status: Former Smoker    Years: 30.00    Types: Cigarettes  . Smokeless tobacco: Never Used     Comment: quit in his 3's  . Alcohol use No    Review of Systems Patient denies headaches, rhinorrhea, blurry vision, numbness, shortness of breath, chest pain, edema, cough, abdominal pain, nausea, vomiting, diarrhea, dysuria, fevers, rashes or hallucinations unless otherwise stated above in HPI. ____________________________________________   PHYSICAL EXAM:  VITAL SIGNS: Vitals:   10/11/15 1218 10/11/15 1453  BP: (!) 99/58 105/69  Pulse: 65 (!) 59  Resp: 20 (!) 31  Temp: 97.5 F (36.4 C)     Constitutional: Alert and oriented. Chronically ill-appearing Eyes: Conjunctivae are normal. PERRL. EOMI. Head: Atraumatic. Nose: No congestion/rhinnorhea. Mouth/Throat: Mucous membranes are moist.  Oropharynx non-erythematous. Neck: No stridor. Painless ROM. No cervical spine tenderness to palpation Hematological/Lymphatic/Immunilogical: No cervical lymphadenopathy. Cardiovascular: Normal rate, regular rhythm. Grossly normal heart sounds.  Good peripheral circulation. Respiratory: Normal respiratory effort.  No retractions. Coarse Bibasilar breath sounds with faint inspiratory crackles  Gastrointestinal: Soft and nontender. No distention. No abdominal bruits. No CVA tenderness. Genitourinary:  Musculoskeletal: No lower extremity tenderness, 2+ edema.  No joint effusions. Neurologic:  Normal speech and language. No gross focal neurologic deficits are appreciated. No gait instability. Skin:  Skin is warm, dry and intact. No  rash noted. Psychiatric: Mood and affect are normal. Speech and behavior are normal.  ____________________________________________   LABS (all labs ordered are listed, but only abnormal results are displayed)  Results for orders placed or performed in visit on 10/11/15 (from the past 24 hour(s))  CBC with Differential/Platelet     Status: Abnormal   Collection Time: 10/11/15 10:48 AM  Result Value Ref Range   WBC 2.9 (L) 3.8 - 10.6 K/uL   RBC 2.88 (L) 4.40 - 5.90 MIL/uL   Hemoglobin 9.2 (L) 13.0 - 18.0 g/dL   HCT 27.5 (L) 40.0 - 52.0 %   MCV 95.3 80.0 - 100.0 fL   MCH 32.0 26.0 - 34.0 pg   MCHC 33.6 32.0 - 36.0 g/dL   RDW 14.9 (H) 11.5 - 14.5 %   Platelets 66 (L) 150 - 440 K/uL   Neutrophils Relative % 74% %   Neutro Abs 2.2 1.4 - 6.5 K/uL   Lymphocytes Relative 12% %   Lymphs Abs 0.4 (L) 1.0 - 3.6 K/uL   Monocytes Relative 11% %   Monocytes Absolute 0.3 0.2 - 1.0  K/uL   Eosinophils Relative 2% %   Eosinophils Absolute 0.1 0 - 0.7 K/uL   Basophils Relative 1% %   Basophils Absolute 0.0 0 - 0.1 K/uL   ____________________________________________   EKG My review and personal interpretation at Time: 12:32   Indication: sob  Rate: 60  Rhythm: sinus Axis: normal Other: lbbb no sgarbossa criteria ____________________________________________  RADIOLOGY  CXR with Congestive heart failure with mild edema and small bilateral pleural effusions. ____________________________________________   PROCEDURES  Procedure(s) performed: none    Critical Care performed: no ____________________________________________   INITIAL IMPRESSION / ASSESSMENT AND PLAN / ED COURSE  Pertinent labs & imaging results that were available during my care of the patient were reviewed by me and considered in my medical decision making (see chart for details).  DDX: Asthma, copd, CHF, pna, ptx, malignancy, Pe, anemia   Kohen Clemensen is a 75 y.o. who presents to the ED with Several days of  worsening lower extremity swelling that is bilateral in addition to orthopnea. Patient denies any chest pain or pressure. Denies any shortness of breath when sitting upright. Currently undergoing treatment with Rituxan for ITP.  Patient does have a history of congestive heart failure seen at [facility] and based on his presentation concerned that he's got an acute exacerbation of his congestive heart failure. His EKG does show a new left bundle without scar posterior criteria but the patient has no chest pain therefore don't feel that this is consistent with ACS. Patient is not hypoxic at this time and has no tachycardia or 4 have a very low suspicion for PE. No evidence of acute malignancy. No fevers therefore a lower suspicion for pneumonia based on history and presentation will order laboratory evaluation as well as x-ray imaging to evaluate for underlying process.  Clinical Course  Comment By Time  Elevated troponin noted. Laboratory otherwise in baseline. Have consult to cardiology. Merlyn Lot, MD 08/17 1540  Spoke with Dr. Carren Rang regarding the patient's elevated troponin and cardiac history. Given his thrombocytopenia patient is not amenable urgent but given his signs of worsening congestive heart failure does need further evaluation for ischemic cardiomyopathy. We will hold heparin at this time a provide aspirin. [facility] page hospitalist for admission. Merlyn Lot, MD 08/17 1616    ----------------------------------------- 4:39 PM on 10/11/2015 -----------------------------------------  Show a markedly elevated BNP. Will give IV Lasix. I spoke with Dr. Bridgett Larsson who kindly agrees to admit patient for further evaluation and management.  Have discussed with the patient and available family all diagnostics and treatments performed thus far and all questions were answered to the best of my ability. The patient demonstrates understanding and agreement with  plan.  ____________________________________________   FINAL CLINICAL IMPRESSION(S) / ED DIAGNOSES  Final diagnoses:  Bilateral edema of lower extremity  Shortness of breath  Troponin level elevated  Elevated brain natriuretic peptide (BNP) level      NEW MEDICATIONS STARTED DURING THIS VISIT:  New Prescriptions   No medications on file     Note:  This document was prepared using Dragon voice recognition software and may include unintentional dictation errors.    Merlyn Lot, MD 10/11/15 [phone]

## 2015-10-11 NOTE — ED Notes (Signed)
Dr. Quentin Cornwall notified of critical trop of 0.15

## 2015-10-11 NOTE — H&P (Signed)
[place] [facility] [place] NAME: Robert Parks    MR#:  [id number]  DATE OF BIRTH:  1941-01-05  DATE OF ADMISSION:  10/11/2015  PRIMARY CARE PHYSICIAN: [place]   REQUESTING/REFERRING PHYSICIAN: Merlyn Lot, MD  CHIEF COMPLAINT:   Chief Complaint  Patient presents with  . Leg Swelling  . Shortness of Breath   Leg Swelling and Shortness of Breath for 3 Months, worsening for a few weeks. HISTORY OF PRESENT ILLNESS:  Robert Parks  is a 75 y.o. male with a known history of CAD, CKD, hypertension and diabetes. The patient has had leg swelling and shortness of breath for the past 3 months, which has been worsening recently. He also complains weight gain and generalized weakness. He denies any fever or chills, no cough or sputum or wheezing, denies any orthopnea or nocturnal dyspnea. Chest x-ray show congestive heart failure. He was treated with Lasix 60 mg IV 1 dose in the ED.  PAST MEDICAL HISTORY:   Past Medical History:  Diagnosis Date  . Anginal pain ([place])    had angina 2-3 mths prior to visit  . Arthritis   . Chronic kidney disease    follwed by Nephrologist Dr Juanito Doom in [place], Stage 3  . Coronary artery disease   . DDD (degenerative disc disease), cervical   . Diabetes mellitus without complication (HCC)    DIET CONTROLLED   . Frequent urination   . GERD (gastroesophageal reflux disease)   . Gout   . Hyperlipidemia   . Hypertension   . Pancytopenia ([place])   . Shortness of breath dyspnea    at rest, mainly when he's active  . Stroke [facility])    ?? small stroke om 2017  came and went    PAST SURGICAL HISTORY:   Past Surgical History:  Procedure Laterality Date  . APPENDECTOMY    . BACK SURGERY  1980'S  . CARDIAC CATHETERIZATION     back in late 1990's [facility]  . COLONOSCOPY WITH PROPOFOL N/A 08/21/2014   Procedure: COLONOSCOPY WITH PROPOFOL;  Surgeon: Hulen Luster, MD;  Location: [facility] ENDOSCOPY;   Service: Gastroenterology;  Laterality: N/A;  . CORONARY ARTERY BYPASS GRAFT  1994  . EYE SURGERY Left    Cataract    SOCIAL HISTORY:   Social History  Substance Use Topics  . Smoking status: Former Smoker    Years: 30.00    Types: Cigarettes  . Smokeless tobacco: Never Used     Comment: quit in his 41's  . Alcohol use No    FAMILY HISTORY:   Family History  Problem Relation Age of Onset  . Heart attack Mother   . Cancer Father     DRUG ALLERGIES:   Allergies  Allergen Reactions  . No Known Allergies Other (See Comments)    REVIEW OF SYSTEMS:   Review of Systems  Constitutional: Negative for chills and fever.  HENT: Positive for congestion.   Eyes: Negative for blurred vision and double vision.  Respiratory: Positive for shortness of breath. Negative for cough, hemoptysis, sputum production, wheezing and stridor.   Cardiovascular: Positive for leg swelling. Negative for chest pain, palpitations, orthopnea and claudication.  Gastrointestinal: Positive for constipation. Negative for abdominal pain, diarrhea, nausea and vomiting.  Genitourinary: Negative for dysuria and urgency.  Musculoskeletal: Negative for back pain.  Skin: Negative for rash.  Neurological: Positive for weakness. Negative for dizziness, focal weakness, loss of consciousness and headaches.  Psychiatric/Behavioral:  Negative for depression.    MEDICATIONS AT HOME:   Prior to Admission medications   Medication Sig Start Date End Date Taking? Authorizing Provider  acetaminophen (TYLENOL) 650 MG CR tablet Take 650 mg by mouth every 8 (eight) hours as needed.    Yes Historical Provider, MD  aspirin EC 81 MG tablet Take 81 mg by mouth. 08/13/12  Yes Historical Provider, MD  atorvastatin (LIPITOR) 10 MG tablet Take 10 mg by mouth daily.  10/01/15  Yes Historical Provider, MD  colchicine 0.6 MG tablet Take 0.3-0.6 mg by mouth See admin instructions. Take 0.3mg  once daily to prevent gout, take 1.2mg  at  first sign of gout flare, may follow in 1 hour with 0.6mg , max 3 tabs 11/11/11  Yes Historical Provider, MD  hydrALAZINE (APRESOLINE) 100 MG tablet Take 100 mg by mouth 2 (two) times daily.   Yes Historical Provider, MD  isosorbide mononitrate (IMDUR) 120 MG 24 hr tablet Take 240 mg by mouth daily.  05/30/15  Yes Historical Provider, MD  metoprolol succinate (TOPROL-XL) 100 MG 24 hr tablet Take 200 mg by mouth daily.  10/01/15  Yes Historical Provider, MD  nitroGLYCERIN (NITROSTAT) 0.4 MG SL tablet Place 0.4 mg under the tongue every 5 (five) minutes as needed.  11/27/14  Yes Historical Provider, MD  potassium chloride SA (K-DUR,KLOR-CON) 20 MEQ tablet Take 20 mEq by mouth daily.   Yes Historical Provider, MD  ranolazine (RANEXA) 500 MG 12 hr tablet Take 1,000 mg by mouth 2 (two) times daily.  10/01/15  Yes Historical Provider, MD  tamsulosin (FLOMAX) 0.4 MG CAPS capsule Take 0.4 mg by mouth daily.  11/27/14  Yes Historical Provider, MD  amLODipine (NORVASC) 10 MG tablet Take 10 mg by mouth daily.    Historical Provider, MD  furosemide (LASIX) 20 MG tablet Take 40 mg by mouth daily.    Historical Provider, MD  potassium chloride (KLOR-CON) 20 MEQ packet Take 20 mEq by mouth daily. Patient not taking: Reported on 10/11/2015 10/11/15   Lloyd Huger, MD      VITAL SIGNS:  Blood pressure (!) 112/58, pulse (!) 59, temperature 97.5 F (36.4 C), temperature source Oral, resp. rate 18, height 5\' 8"  (1.727 m), weight 159 lb (72.1 kg), SpO2 100 %.  PHYSICAL EXAMINATION:  Physical Exam  Constitutional: He is oriented to person, place, and time and well-developed, well-nourished, and in no distress.  HENT:  Head: Normocephalic and atraumatic.  Mouth/Throat: Oropharynx is clear and moist.  Eyes: Conjunctivae and EOM are normal. Pupils are equal, round, and reactive to light.  Neck: Normal range of motion. Neck supple. No JVD present. No thyromegaly present.  Cardiovascular: Normal rate, regular rhythm and  normal heart sounds.  Exam reveals no gallop.   No murmur heard. Pulmonary/Chest: Effort normal. No stridor. No respiratory distress. He has no wheezes. He has rales.  Abdominal: Soft. Bowel sounds are normal. He exhibits no distension. There is no tenderness.  Musculoskeletal: Normal range of motion. He exhibits edema. He exhibits no tenderness.  Neurological: He is alert and oriented to person, place, and time. No cranial nerve deficit.  Skin: Skin is warm.  Psychiatric: Mood, memory, affect and judgment normal.    LABORATORY PANEL:   CBC  Recent Labs Lab 10/11/15 1452  WBC 2.6*  HGB 9.6*  HCT 29.1*  PLT 66*   ------------------------------------------------------------------------------------------------------------------  Chemistries   Recent Labs Lab 10/11/15 1452  NA 139  K 5.3*  CL 108  CO2 22  GLUCOSE 96  BUN 71*  CREATININE 4.88*  CALCIUM 9.0  AST 26  ALT 15*  ALKPHOS 72  BILITOT 1.0   ------------------------------------------------------------------------------------------------------------------  Cardiac Enzymes  Recent Labs Lab 10/11/15 1452  TROPONINI 0.15*   ------------------------------------------------------------------------------------------------------------------  RADIOLOGY:  Dg Chest 2 View  Result Date: 10/11/2015 CLINICAL DATA:  Short of breath and leg swelling. Concern for heart failure or pneumonia EXAM: CHEST  2 VIEW COMPARISON:  None. FINDINGS: Postop CABG. Cardiac enlargement. Pulmonary vascular congestion with mild interstitial edema. Small bilateral pleural effusions. Bibasilar atelectasis. IMPRESSION: Congestive heart failure with mild edema and small bilateral pleural effusions. Bibasilar atelectasis. Electronically Signed   By: Franchot Gallo M.D.   On: 10/11/2015 16:06      IMPRESSION AND PLAN:   Acute new onset CHF. The patient will be admitted to telemetry floor. Start Lasix 40 mg IV twice a day, CHF protocol,  echocardiograph and cardiology consult.  Elevated troponin. Possible due to demanding ischemia and acute renal failure. Follow-up troponin level and cardiology consult.  Hypertension. Continue Lasix but hold amlodipine, Lopressor and hydralazine due to low side blood pressure.  Acute renal failure on CKD. Follow-up BMP while on Lasix.  Hyperkalemia. Hold potassium supplement and follow-up BMP.  Chronic pancytopenia and ITP. Follow-up with Dr. Grayland Ormond as outpatient.  All the records are reviewed and case discussed with ED provider. Management plans discussed with the patient, His wife and they are in agreement.  CODE STATUS: Full code  TOTAL TIME TAKING CARE OF THIS PATIENT: 56 minutes.    Demetrios Loll M.D on 10/11/2015 at 5:52 PM  Between 7am to 6pm - Pager - [phone]  After 6pm go to [email or web address] - Technical brewer  Hospitalists  Office  [phone]  CC: Primary care physician; [facility]   Note: This dictation was prepared with Dragon dictation along with smaller phrase technology. Any transcriptional errors that result from this process are unintentional.

## 2015-10-12 ENCOUNTER — Inpatient Hospital Stay: Payer: Medicare HMO

## 2015-10-12 ENCOUNTER — Inpatient Hospital Stay
Admit: 2015-10-12 | Discharge: 2015-10-12 | Disposition: A | Discharge status: Home / Self Care | Payer: Medicare HMO | Attending: Internal Medicine | Admitting: Internal Medicine

## 2015-10-12 LAB — BASIC METABOLIC PANEL
ANION GAP: 8 (ref 5–15)
ANION GAP: 10 (ref 5–15)
BUN: 73 mg/dL — ABNORMAL HIGH (ref 6–20)
BUN: 70 mg/dL — ABNORMAL HIGH (ref 6–20)
CALCIUM: 8.8 mg/dL — AB (ref 8.9–10.3)
CHLORIDE: 107 mmol/L (ref 101–111)
CHLORIDE: 109 mmol/L (ref 101–111)
CO2: 23 mmol/L (ref 22–32)
CO2: 21 mmol/L — ABNORMAL LOW (ref 22–32)
CREATININE: 4.94 mg/dL — AB (ref 0.61–1.24)
Calcium: 8.8 mg/dL — ABNORMAL LOW (ref 8.9–10.3)
Creatinine, Ser: 4.82 mg/dL — ABNORMAL HIGH (ref 0.61–1.24)
GFR calc Af Amer: 12 mL/min — ABNORMAL LOW (ref >60)
GFR calc non Af Amer: 10 mL/min — ABNORMAL LOW (ref >60)
GFR, EST AFRICAN AMERICAN: 12 mL/min — AB (ref >60)
GFR, EST NON AFRICAN AMERICAN: 11 mL/min — AB (ref >60)
GLUCOSE: 78 mg/dL (ref 65–99)
Glucose, Bld: 98 mg/dL (ref 65–99)
POTASSIUM: 4.8 mmol/L (ref 3.5–5.1)
Potassium: 4.9 mmol/L (ref 3.5–5.1)
SODIUM: 140 mmol/L (ref 135–145)
Sodium: 138 mmol/L (ref 135–145)

## 2015-10-12 LAB — MAGNESIUM: MAGNESIUM: 2.4 mg/dL (ref 1.7–2.4)

## 2015-10-12 LAB — GLUCOSE, CAPILLARY
GLUCOSE-CAPILLARY: 90 mg/dL (ref 65–99)
GLUCOSE-CAPILLARY: 85 mg/dL (ref 65–99)
GLUCOSE-CAPILLARY: 104 mg/dL — AB (ref 65–99)
GLUCOSE-CAPILLARY: 102 mg/dL — AB (ref 65–99)

## 2015-10-12 LAB — CBC
HCT: 28.1 % — ABNORMAL LOW (ref 40.0–52.0)
HEMOGLOBIN: 9.3 g/dL — AB (ref 13.0–18.0)
MCH: 32 pg (ref 26.0–34.0)
MCHC: 32.9 g/dL (ref 32.0–36.0)
MCV: 97.3 fL (ref 80.0–100.0)
Platelets: 58 10*3/uL — ABNORMAL LOW (ref 150–440)
RBC: 2.89 MIL/uL — AB (ref 4.40–5.90)
RDW: 14.9 % — ABNORMAL HIGH (ref 11.5–14.5)
WBC: 2.1 10*3/uL — ABNORMAL LOW (ref 3.8–10.6)

## 2015-10-12 MED ORDER — SODIUM CHLORIDE 0.9% FLUSH: 3.0000 mL | Freq: Two times a day (BID) | INTRAVENOUS | Status: DC

## 2015-10-12 MED ORDER — SODIUM CHLORIDE 0.9 % IV SOLN: 250.0000 mL | INTRAVENOUS | Status: DC

## 2015-10-12 MED ORDER — SODIUM CHLORIDE 0.9% FLUSH: 3.0000 mL | INTRAVENOUS | Status: DC | PRN

## 2015-10-12 MED ORDER — RANOLAZINE ER 500 MG PO TB12
500.0000 mg | ORAL_TABLET | Freq: Two times a day (BID) | ORAL | Status: DC
  Administered 2015-10-12 – 2015-10-16 (×8): 500 mg via ORAL
  Filled 2015-10-12 (×8): qty 1

## 2015-10-12 MED ORDER — SODIUM CHLORIDE 0.9 % IV SOLN: INTRAVENOUS | Status: DC

## 2015-10-12 NOTE — Progress Notes (Addendum)
[place] [facility] [place] NAME: Robert Parks    MR#:  [id number]  DATE OF BIRTH:  1940-05-23  SUBJECTIVE:   Patient complaining of difficulty swallowing to the nurses this morning.  REVIEW OF SYSTEMS:    Review of Systems  Constitutional: Negative.  Negative for chills, fever and malaise/fatigue.  HENT: Negative.  Negative for ear discharge, ear pain, hearing loss, nosebleeds and sore throat.   Eyes: Negative.  Negative for blurred vision and pain.  Respiratory: Positive for shortness of breath. Negative for cough, hemoptysis and wheezing.        DOE  Cardiovascular: Positive for leg swelling. Negative for chest pain and palpitations.  Gastrointestinal: Negative.  Negative for abdominal pain, blood in stool, diarrhea, nausea and vomiting.  Genitourinary: Negative.  Negative for dysuria.  Musculoskeletal: Negative.  Negative for back pain.  Skin: Negative.   Neurological: Negative for dizziness, tremors, speech change, focal weakness, seizures and headaches.  Endo/Heme/Allergies: Negative.  Does not bruise/bleed easily.  Psychiatric/Behavioral: Negative.  Negative for depression, hallucinations and suicidal ideas.    Tolerating Diet: npo      DRUG ALLERGIES:   Allergies  Allergen Reactions  . No Known Allergies Other (See Comments)    VITALS:  Blood pressure 115/64, pulse (!) 56, temperature 97.8 F (36.6 C), temperature source Oral, resp. rate 18, height 5\' 8"  (1.727 m), weight 69.7 kg (153 lb 9.6 oz), SpO2 99 %.  PHYSICAL EXAMINATION:   Physical Exam  Constitutional: He is oriented to person, place, and time and well-developed, well-nourished, and in no distress. No distress.  HENT:  Head: Normocephalic.  cervcial coolar worn  Eyes: No scleral icterus.  Neck: Normal range of motion. Neck supple. No JVD present. No tracheal deviation present.  Cardiovascular: Normal rate and regular rhythm.  Exam reveals no gallop and no  friction rub.   Murmur heard. Displaced PMI  Pulmonary/Chest: Effort normal and breath sounds normal. No respiratory distress. He has no wheezes. He has no rales. He exhibits no tenderness.  Crackles at bases with no tactile fremitus  Abdominal: Soft. Bowel sounds are normal. He exhibits distension. He exhibits no mass. There is no tenderness. There is no rebound and no guarding.  Musculoskeletal: Normal range of motion. He exhibits edema.  Neurological: He is alert and oriented to person, place, and time.  Skin: Skin is warm. No rash noted. No erythema.  Psychiatric: Affect and judgment normal.      LABORATORY PANEL:   CBC  Recent Labs Lab 10/12/15 0406  WBC 2.1*  HGB 9.3*  HCT 28.1*  PLT 58*   ------------------------------------------------------------------------------------------------------------------  Chemistries   Recent Labs Lab 10/11/15 1452 10/12/15 0406  NA 139 138  K 5.3* 4.9  CL 108 107  CO2 22 23  GLUCOSE 96 78  BUN 71* 73*  CREATININE 4.88* 4.94*  CALCIUM 9.0 8.8*  MG  --  2.4  AST 26  --   ALT 15*  --   ALKPHOS 72  --   BILITOT 1.0  --    ------------------------------------------------------------------------------------------------------------------  Cardiac Enzymes  Recent Labs Lab 10/11/15 1452 10/11/15 2009  TROPONINI 0.15* 0.13*   ------------------------------------------------------------------------------------------------------------------  RADIOLOGY:  Dg Chest 2 View  Result Date: 10/11/2015 CLINICAL DATA:  Short of breath and leg swelling. Concern for heart failure or pneumonia EXAM: CHEST  2 VIEW COMPARISON:  None. FINDINGS: Postop CABG. Cardiac enlargement. Pulmonary vascular congestion with mild interstitial edema. Small bilateral pleural effusions. Bibasilar atelectasis. IMPRESSION: Congestive  heart failure with mild edema and small bilateral pleural effusions. Bibasilar atelectasis. Electronically Signed   By: Franchot Gallo M.D.   On: 10/11/2015 16:06     ASSESSMENT AND PLAN:   75 year old male with a history of CABG, chronic kidney disease stage III and cervical stenosis at the level CIII-C8 with planned cervical decompression at some point who presented with lower extremity edema and shortness of breath and found to have new onset congestive heart failure.  1. Acute congestive heart failure: Continue Lasix IV with monitoring of electrolytes and daily weight well as I/O. Appreciate her neurology consult  Follow-up on echo cardio. Patient would benefit to be restarted on beta blocker if heart rate tolerates clear and ACE inhibitor if creatinine stabilizes.   2. Elevated troponin: At this time patient is not a candidate for invasive workup with cardiac catheterization due to acute on chronic renal failure. He will eventually need a stress test once CHF has improved.  3. 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  Hold nephrotoxic agents.  Consult nephrology.   4. Cervical stenosis: Patient has been evaluated by neurosurgery and plan is for decompression once these acute symptoms have subsided.  5. History of chronic thrombocytopenia with ITP: Patient follows with oncology. Platelet count is stable.  6. History of hypertension: Blood pressure is low normal and therefore blood pressure medications are on hold. 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  7. History of ASCVD/CABG: Continue Ranexa and aspirin and Lipitor. Once blood pressure normalizes then may restart isosorbide, possibly metoprolol if heart rate tolerates.  8. BPH: Continue tamsulosin  9. Difficulty swallowing may be related to cervical stenosis which is known. Consult speech.  Management plans discussed with the patient and he is in agreement.  CODE STATUS: full  TOTAL TIME TAKING CARE OF THIS PATIENT: 35 minutes.     POSSIBLE D/C 3 days, DEPENDING ON CLINICAL  CONDITION.   Kynslei Art M.D on 10/12/2015 at 11:48 AM  Between 7am to 6pm - Pager - [phone] After 6pm go to [email or web address] - password [email or web address] [facility]  Office  [phone]  CC: Primary care physician; [facility] [place]  Note: This dictation was prepared with Dragon dictation along with smaller phrase technology. Any transcriptional errors that result from this process are unintentional.

## 2015-10-12 NOTE — Care Management (Signed)
Referred to CHF portal 

## 2015-10-12 NOTE — Discharge Instructions (Signed)
Heart Failure Clinic appointment on October 31, 2015 at 11:30am with Darylene Price, [place]. Please call [phone] to reschedule.

## 2015-10-12 NOTE — Evaluation (Signed)
Clinical/Bedside Swallow Evaluation Patient Details  Name: Robert Parks MRN: [id number] Date of Birth: 1940/08/10  Today's Date: 10/12/2015 Time: SLP Start Time (ACUTE ONLY): [id number] SLP Stop Time (ACUTE ONLY): 1435 SLP Time Calculation (min) (ACUTE ONLY): 30 min  Past Medical History:  Past Medical History:  Diagnosis Date  . Anginal pain ([place])    had angina 2-3 mths prior to visit  . Arthritis   . Chronic kidney disease    follwed by Nephrologist Dr Juanito Doom in [place], Stage 3  . Coronary artery disease   . DDD (degenerative disc disease), cervical   . Diabetes mellitus without complication (HCC)    DIET CONTROLLED   . Frequent urination   . GERD (gastroesophageal reflux disease)   . Gout   . Hyperlipidemia   . Hypertension   . Pancytopenia ([place])   . Shortness of breath dyspnea    at rest, mainly when he's active  . Stroke [facility])    ?? small stroke om 2017  came and went   Past Surgical History:  Past Surgical History:  Procedure Laterality Date  . APPENDECTOMY    . BACK SURGERY  1980'S  . CARDIAC CATHETERIZATION     back in late 1990's [facility]  . COLONOSCOPY WITH PROPOFOL N/A 08/21/2014   Procedure: COLONOSCOPY WITH PROPOFOL;  Surgeon: Hulen Luster, MD;  Location[facility] ENDOSCOPY;  Service: Gastroenterology;  Laterality: N/A;  . CORONARY ARTERY BYPASS GRAFT  1994  . EYE SURGERY Left    Cataract   HPI:  Recent surgury for cerbical stenosis   Assessment / Plan / Recommendation Clinical Impression  pt presents with a mild pharyngeal dysphagia characterized by discomfort when swallowing hard dry textures or large pills/ capsules. pt stated that the food would not "go down" when dry. pt requests a softer diet with moistened foods for ease of intake. pt and family member educated and agreed to diet change. md notified and in agreement with plan.     Aspiration Risk  Mild aspiration risk    Diet Recommendation Dysphagia 3 (Mech soft);Thin liquid   Liquid  Administration via: Cup;Straw Medication Administration: Crushed with puree Supervision: Patient able to self feed Compensations: Slow rate;Small sips/bites;Follow solids with liquid Postural Changes: Seated upright at 90 degrees    Other  Recommendations Oral Care Recommendations: Patient independent with oral care   Follow up Recommendations  None    Frequency and Duration min 2x/week  1 week       Prognosis Prognosis for Safe Diet Advancement: Good      Swallow Study   General Date of Onset: 10/11/15 HPI: Recent surgury for cerbical stenosis Type of Study: Bedside Swallow Evaluation Diet Prior to this Study: Regular;Thin liquids Temperature Spikes Noted: No Respiratory Status: Room air History of Recent Intubation: No Behavior/Cognition: Alert;Cooperative;Pleasant mood Oral Cavity Assessment: Within Functional Limits Oral Care Completed by SLP: No Oral Cavity - Dentition: Adequate natural dentition Vision: Functional for self-feeding Self-Feeding Abilities: Able to feed self Patient Positioning: Upright in bed Baseline Vocal Quality: Normal Volitional Cough: Strong Volitional Swallow: Able to elicit    Oral/Motor/Sensory Function Overall Oral Motor/Sensory Function: Within functional limits   Ice Chips Ice chips: Within functional limits Presentation: Self Fed;Spoon   Thin Liquid Thin Liquid: Within functional limits Presentation: Self Fed;Straw;Cup    Nectar Thick Nectar Thick Liquid: Not tested   Honey Thick Honey Thick Liquid: Not tested   Puree Puree: Within functional limits Presentation: Self Fed;Spoon   Solid   GO  Solid: Within functional limits Presentation: Self Fed;Spoon Other Comments: preferred to have solids with applesauce for moisture        [place] Sauber 10/12/2015,3:06 PM

## 2015-10-12 NOTE — Care Management Important Message (Signed)
Important Message  Patient Details  Name: Robert Parks MRN: [id number] Date of Birth: 1940-05-09   Medicare Important Message Given:  Yes    Jolly Mango, RN 10/12/2015, 8:55 AM

## 2015-10-12 NOTE — Care Management (Addendum)
RNCM assessment for discharge planning needs. Met with patient with wife present.  Presents from home with wife. Uses a cane due to increasing weakness and cervical issues. He has a walker but doe not use it.   New Onset CHF. IV diuresis. PCP is at [facility]. Renal failure with nephrology consult, ? Dialysis. Cardiology specialist at [facility]. PT consult ordered. No home health. Willing to have home health if needed with no agency preference. Wife provides transportation

## 2015-10-12 NOTE — Progress Notes (Signed)
PT Cancellation Note  Patient Details Name: Robert Parks MRN: [id number] DOB: [birth year]   Cancelled Treatment:    Reason Eval/Treat Not Completed: Medical issues which prohibited therapy.  Elevating troponins and will check again tomorrow to see how pt is progressing.  Has significant cardiac history of CABG and is not being given cardiac cath due to his renal decline.  See if pt is improving medically tomorrow.   Ramond Dial 10/12/2015, 3:42 PM    Mee Hives, PT MS Acute Rehab Dept. Number: [place] [facility] [place]

## 2015-10-12 NOTE — Progress Notes (Signed)
Initial Heart Failure Clinic appointment scheduled for October 31, 2015 at 11:30am. Thank you for the referral.

## 2015-10-12 NOTE — Consult Note (Signed)
Reason for Consult: Acute on chronic kidney disease  Referring Provider: Dr Bettey Costa  HPI: Mr Betzer is a [name] with known CKD3 who is followed in the [facility] Nephrology Office by Dr Juanito Doom. He was last seen on 07/17/15. At that time, his eGFR was 46 ml/min. He had a SCR of 2.4 on 10/01/15 and now is admitted with a SCR of 4.9 (eGFR 12 ml/min). He tells me that he came to the ED for the following symptoms: weakness, SOB and edema/abd distention. He has been feeling weak for the past week and has been using a cane (which is not his norm). In addition, he admits to worsening LE edema for the past 2 weeks associated with nausea, orthopnea and abdominal distention. He also has been feeling nausea for the past 2 weeks with loss of appetite for 2-3 months which he attributes to dysphagia. He saw his PCP about 2 weeks ago and she increased his lasix to 80 mg bid. He did not notice any improvement in his symptoms and did not feel that his urine output increased.  Here is on lasix 80 mg IV bid. His appetite is a little better. His wife is present at the time of our meeting.  PMH: CKD3 secondary to hypertension CAD h/o CABG DM HTN HLD Gout BPH Cervical spine disease- currently wear collage  Home Meds: Norvasc 10 mg qd, ASA, Ranexa, Lasix, Hydralazine 100 mg bid, ISMN 240 mg qd, Metop 200 mg qd XL, KCL 20 meq qd.  He denies any NSAID use  ALL: NKDA  SH: not smoking  FH: Not significant to present issues  EXAM:  97.8  115/64  56  99 RA  I/O 360/625 Gen: elderly, wearing soft collar Lungs: diffuse rales CV: no rub Abd: distended, with fluid Back: pre-sacral edema Ext: pitting edema to calf  LABS: Cr 4.9 CXR reviewed   A/P: Mr Medina is a [name] with acute on chronic kidney disease in the setting of volume overload  1) AKI- His Cr was 2.4 on 8/7 and now has worsened acutely. Currently no acute indications for dialysis but he is markedly overloaded. I agree with IV  lasix to keep him net negative by 1L per day. If unable to mobilize fluid with IV diuretics or renal function deteriorates, then would initiate hemodialysis on this admission. He would need a tunneled dialysis catheter. I discussed this with the patient and his wife and they agree to pursue dialysis if needed. Again, at present- no need to initiate. With his h/o heart disease, dialysis would be high risk for exacerbating his ischemic heart disease. They understand.  Keep systolic blood pressure above 110 mm hg and agree with holding antihypertensives. Relative hypotension could contribute to his AKI.  Please check a renal ultrasound to evaluate for obstruction (I told Dr Benjie Karvonen).  Hold potassium  2) HTN- see #1  3) Volume Overload- see #1  4) BPH- check renal ultrasound to eval for obstruction  Please page [phone] for any questions over the weekend. The patient and Dr Benjie Karvonen are aware that I will not be seeing the patient until Monday.

## 2015-10-12 NOTE — Progress Notes (Signed)
Robert Parks is a 75 y.o. male  [id number]  Primary Cardiologist: Neoma Laming Reason for Consultation: CHF  HPI: This is a 75 year old African-American male with a past medical history of CABG at [facility] in 1994 and renal failure presented to the hospital with shortness of breath orthopnea PND and leg swelling. He denies any chest pain.   Review of Systems: No nausea vomiting or dizziness or passing out   Past Medical History:  Diagnosis Date  . Anginal pain ([place])    had angina 2-3 mths prior to visit  . Arthritis   . Chronic kidney disease    follwed by Nephrologist Dr Juanito Doom in [place], Stage 3  . Coronary artery disease   . DDD (degenerative disc disease), cervical   . Diabetes mellitus without complication (HCC)    DIET CONTROLLED   . Frequent urination   . GERD (gastroesophageal reflux disease)   . Gout   . Hyperlipidemia   . Hypertension   . Pancytopenia ([place])   . Shortness of breath dyspnea    at rest, mainly when he's active  . Stroke [facility])    ?? small stroke om 2017  came and went    Medications Prior to Admission  Medication Sig Dispense Refill  . acetaminophen (TYLENOL) 650 MG CR tablet Take 650 mg by mouth every 8 (eight) hours as needed.     Marland Kitchen aspirin EC 81 MG tablet Take 81 mg by mouth.    Marland Kitchen atorvastatin (LIPITOR) 10 MG tablet Take 10 mg by mouth daily.     . colchicine 0.6 MG tablet Take 0.3-0.6 mg by mouth See admin instructions. Take 0.3mg  once daily to prevent gout, take 1.2mg  at first sign of gout flare, may follow in 1 hour with 0.6mg , max 3 tabs    . hydrALAZINE (APRESOLINE) 100 MG tablet Take 100 mg by mouth 2 (two) times daily.    . isosorbide mononitrate (IMDUR) 120 MG 24 hr tablet Take 240 mg by mouth daily.     . metoprolol succinate (TOPROL-XL) 100 MG 24 hr tablet Take 200 mg by mouth daily.     . nitroGLYCERIN (NITROSTAT) 0.4 MG SL tablet Place 0.4 mg under the tongue every 5 (five) minutes as needed.     . potassium chloride  SA (K-DUR,KLOR-CON) 20 MEQ tablet Take 20 mEq by mouth daily.    . ranolazine (RANEXA) 500 MG 12 hr tablet Take 1,000 mg by mouth 2 (two) times daily.     . tamsulosin (FLOMAX) 0.4 MG CAPS capsule Take 0.4 mg by mouth daily.     Marland Kitchen amLODipine (NORVASC) 10 MG tablet Take 10 mg by mouth daily.    . furosemide (LASIX) 20 MG tablet Take 40 mg by mouth daily.    . potassium chloride (KLOR-CON) 20 MEQ packet Take 20 mEq by mouth daily. (Patient not taking: Reported on 10/11/2015) 30 packet 3     . aspirin EC  81 mg Oral Daily  . atorvastatin  10 mg Oral Daily  . furosemide  40 mg Intravenous Q12H  . heparin  5,000 Units Subcutaneous Q8H  . insulin aspart  0-5 Units Subcutaneous QHS  . insulin aspart  0-9 Units Subcutaneous TID WC  . potassium chloride  20 mEq Oral Daily  . ranolazine  1,000 mg Oral BID  . sodium chloride flush  3 mL Intravenous Q12H  . tamsulosin  0.4 mg Oral Daily    Infusions: . [START ON 10/13/2015] sodium chloride    .  sodium chloride      Allergies  Allergen Reactions  . No Known Allergies Other (See Comments)    Social History   Social History  . Marital status: Married    Spouse name: N/A  . Number of children: N/A  . Years of education: N/A   Occupational History  . Not on file.   Social History Main Topics  . Smoking status: Former Smoker    Years: 30.00    Types: Cigarettes  . Smokeless tobacco: Never Used     Comment: quit in his 5's  . Alcohol use No  . Drug use: No  . Sexual activity: Not on file   Other Topics Concern  . Not on file   Social History Narrative  . No narrative on file    Family History  Problem Relation Age of Onset  . Heart attack Mother   . Cancer Father     PHYSICAL EXAM: Vitals:   10/12/15 0300 10/12/15 0808  BP: (!) 108/57 (!) 102/56  Pulse: (!) 57 (!) 55  Resp: 16 18  Temp: 98 F (36.7 C) 97.6 F (36.4 C)     Intake/Output Summary (Last 24 hours) at 10/12/15 0828 Last data filed at 10/12/15  0748  Gross per 24 hour  Intake              240 ml  Output              850 ml  Net             -610 ml    General:  Well appearing. No respiratory difficulty HEENT: normal Neck: supple. no JVD. Carotids 2+ bilat; no bruits. No lymphadenopathy or thryomegaly appreciated. Cor: PMI nondisplaced. Regular rate & rhythm. No rubs, gallops or murmurs. Lungs: clear Abdomen: soft, nontender, nondistended. No hepatosplenomegaly. No bruits or masses. Good bowel sounds. Extremities: no cyanosis, clubbing, rash, edema Neuro: alert & oriented x 3, cranial nerves grossly intact. moves all 4 extremities w/o difficulty. Affect pleasant.  [id number] rhythm with left bundle branch block  Results for orders placed or performed during the hospital encounter of 10/11/15 (from the past 24 hour(s))  Brain natriuretic peptide     Status: Abnormal   Collection Time: 10/11/15  2:37 PM  Result Value Ref Range   B Natriuretic Peptide >4,500.0 (H) 0.0 - 100.0 pg/mL  CBC with Differential     Status: Abnormal   Collection Time: 10/11/15  2:52 PM  Result Value Ref Range   WBC 2.6 (L) 3.8 - 10.6 K/uL   RBC 3.02 (L) 4.40 - 5.90 MIL/uL   Hemoglobin 9.6 (L) 13.0 - 18.0 g/dL   HCT 29.1 (L) 40.0 - 52.0 %   MCV 96.3 80.0 - 100.0 fL   MCH 31.7 26.0 - 34.0 pg   MCHC 32.9 32.0 - 36.0 g/dL   RDW 15.1 (H) 11.5 - 14.5 %   Platelets 66 (L) 150 - 440 K/uL   Neutrophils Relative % 74% %   Neutro Abs 2.0 1.4 - 6.5 K/uL   Lymphocytes Relative 11% %   Lymphs Abs 0.3 (L) 1.0 - 3.6 K/uL   Monocytes Relative 12% %   Monocytes Absolute 0.3 0.2 - 1.0 K/uL   Eosinophils Relative 2% %   Eosinophils Absolute 0.0 0 - 0.7 K/uL   Basophils Relative 1% %   Basophils Absolute 0.0 0 - 0.1 K/uL  Comprehensive metabolic panel     Status: Abnormal   Collection Time: 10/11/15  2:52 PM  Result Value Ref Range   Sodium 139 135 - 145 mmol/L   Potassium 5.3 (H) 3.5 - 5.1 mmol/L   Chloride 108 101 - 111 mmol/L   CO2 22 22 - 32 mmol/L    Glucose, Bld 96 65 - 99 mg/dL   BUN 71 (H) 6 - 20 mg/dL   Creatinine, Ser 4.88 (H) 0.61 - 1.24 mg/dL   Calcium 9.0 8.9 - 10.3 mg/dL   Total Protein 6.3 (L) 6.5 - 8.1 g/dL   Albumin 4.0 3.5 - 5.0 g/dL   AST 26 15 - 41 U/L   ALT 15 (L) 17 - 63 U/L   Alkaline Phosphatase 72 38 - 126 U/L   Total Bilirubin 1.0 0.3 - 1.2 mg/dL   GFR calc non Af Amer 11 (L) >60 mL/min   GFR calc Af Amer 12 (L) >60 mL/min   Anion gap 9 5 - 15  Troponin I     Status: Abnormal   Collection Time: 10/11/15  2:52 PM  Result Value Ref Range   Troponin I 0.15 (HH) <0.03 ng/mL  Troponin I     Status: Abnormal   Collection Time: 10/11/15  8:09 PM  Result Value Ref Range   Troponin I 0.13 (HH) <0.03 ng/mL  Glucose, capillary     Status: Abnormal   Collection Time: 10/11/15  9:03 PM  Result Value Ref Range   Glucose-Capillary 104 (H) 65 - 99 mg/dL   Comment 1 Notify RN   Basic metabolic panel     Status: Abnormal   Collection Time: 10/12/15  4:06 AM  Result Value Ref Range   Sodium 138 135 - 145 mmol/L   Potassium 4.9 3.5 - 5.1 mmol/L   Chloride 107 101 - 111 mmol/L   CO2 23 22 - 32 mmol/L   Glucose, Bld 78 65 - 99 mg/dL   BUN 73 (H) 6 - 20 mg/dL   Creatinine, Ser 4.94 (H) 0.61 - 1.24 mg/dL   Calcium 8.8 (L) 8.9 - 10.3 mg/dL   GFR calc non Af Amer 10 (L) >60 mL/min   GFR calc Af Amer 12 (L) >60 mL/min   Anion gap 8 5 - 15  CBC     Status: Abnormal   Collection Time: 10/12/15  4:06 AM  Result Value Ref Range   WBC 2.1 (L) 3.8 - 10.6 K/uL   RBC 2.89 (L) 4.40 - 5.90 MIL/uL   Hemoglobin 9.3 (L) 13.0 - 18.0 g/dL   HCT 28.1 (L) 40.0 - 52.0 %   MCV 97.3 80.0 - 100.0 fL   MCH 32.0 26.0 - 34.0 pg   MCHC 32.9 32.0 - 36.0 g/dL   RDW 14.9 (H) 11.5 - 14.5 %   Platelets 58 (L) 150 - 440 K/uL  Magnesium     Status: None   Collection Time: 10/12/15  4:06 AM  Result Value Ref Range   Magnesium 2.4 1.7 - 2.4 mg/dL  Glucose, capillary     Status: None   Collection Time: 10/12/15  7:37 AM  Result Value Ref Range    Glucose-Capillary 90 65 - 99 mg/dL   Dg Chest 2 View  Result Date: 10/11/2015 CLINICAL DATA:  Short of breath and leg swelling. Concern for heart failure or pneumonia EXAM: CHEST  2 VIEW COMPARISON:  None. FINDINGS: Postop CABG. Cardiac enlargement. Pulmonary vascular congestion with mild interstitial edema. Small bilateral pleural effusions. Bibasilar atelectasis. IMPRESSION: Congestive heart failure with mild edema and small bilateral pleural effusions.  Bibasilar atelectasis. Electronically Signed   By: Franchot Gallo M.D.   On: 10/11/2015 16:06     ASSESSMENT AND PLAN: Congestive heart failure with worsening renal failure and presented with shortness of breath orthopnea and leg swelling. Agree with current treatment of getting Lasix IV and advise getting renal consult for possible dialysis. We will locate echocardiogram and make further recommendations. Patient does have mildly elevated troponin but given the fact the patient has renal failure is not a candidate for revascularization at this time thus catheterization will not be done.  Lean Fayson A

## 2015-10-13 DIAGNOSIS — L8990 Pressure ulcer of unspecified site, unspecified stage: Secondary | ICD-10-CM | POA: Insufficient documentation

## 2015-10-13 LAB — CBC
HCT: 27.8 % — ABNORMAL LOW (ref 40.0–52.0)
HEMOGLOBIN: 9.4 g/dL — AB (ref 13.0–18.0)
MCH: 32.5 pg (ref 26.0–34.0)
MCHC: 33.8 g/dL (ref 32.0–36.0)
MCV: 96.2 fL (ref 80.0–100.0)
PLATELETS: 56 10*3/uL — AB (ref 150–440)
RBC: 2.89 MIL/uL — AB (ref 4.40–5.90)
RDW: 14.8 % — ABNORMAL HIGH (ref 11.5–14.5)
WBC: 2.3 10*3/uL — ABNORMAL LOW (ref 3.8–10.6)

## 2015-10-13 LAB — BASIC METABOLIC PANEL
ANION GAP: 9 (ref 5–15)
BUN: 66 mg/dL — ABNORMAL HIGH (ref 6–20)
CALCIUM: 8.7 mg/dL — AB (ref 8.9–10.3)
CO2: 23 mmol/L (ref 22–32)
CREATININE: 4.42 mg/dL — AB (ref 0.61–1.24)
Chloride: 107 mmol/L (ref 101–111)
GFR, EST AFRICAN AMERICAN: 14 mL/min — AB (ref >60)
GFR, EST NON AFRICAN AMERICAN: 12 mL/min — AB (ref >60)
GLUCOSE: 91 mg/dL (ref 65–99)
Potassium: 4.4 mmol/L (ref 3.5–5.1)
Sodium: 139 mmol/L (ref 135–145)

## 2015-10-13 LAB — GLUCOSE, CAPILLARY
GLUCOSE-CAPILLARY: 94 mg/dL (ref 65–99)
GLUCOSE-CAPILLARY: 104 mg/dL — AB (ref 65–99)
GLUCOSE-CAPILLARY: 105 mg/dL — AB (ref 65–99)
GLUCOSE-CAPILLARY: 107 mg/dL — AB (ref 65–99)

## 2015-10-13 LAB — ECHOCARDIOGRAM COMPLETE
Height: 68 in
Weight: 2457.6 oz

## 2015-10-13 MED ORDER — POTASSIUM CHLORIDE 20 MEQ PO PACK
20.0000 meq | PACK | Freq: Two times a day (BID) | ORAL | Status: DC
Start: 2015-10-13 — End: 2015-10-13

## 2015-10-13 MED ORDER — HEPARIN SODIUM (PORCINE) 5000 UNIT/ML IJ SOLN
5000.0000 [IU] | Freq: Three times a day (TID) | INTRAMUSCULAR | Status: DC
  Administered 2015-10-13 – 2015-10-15 (×5): 5000 [IU] via SUBCUTANEOUS
  Filled 2015-10-13 (×6): qty 1

## 2015-10-13 MED ORDER — FUROSEMIDE 10 MG/ML IJ SOLN
40.0000 mg | Freq: Three times a day (TID) | INTRAMUSCULAR | Status: DC
  Administered 2015-10-13 – 2015-10-15 (×6): 40 mg via INTRAVENOUS
  Filled 2015-10-13 (×6): qty 4

## 2015-10-13 MED ORDER — CARVEDILOL 3.125 MG PO TABS
3.1250 mg | ORAL_TABLET | Freq: Two times a day (BID) | ORAL | Status: DC
  Administered 2015-10-13 – 2015-10-16 (×6): 3.125 mg via ORAL
  Filled 2015-10-13 (×6): qty 1

## 2015-10-13 MED ORDER — POTASSIUM CHLORIDE 20 MEQ PO PACK
20.0000 meq | PACK | Freq: Every day | ORAL | Status: DC
  Administered 2015-10-14 – 2015-10-16 (×3): 20 meq via ORAL
  Filled 2015-10-13 (×3): qty 1

## 2015-10-13 NOTE — Progress Notes (Addendum)
[place] [facility] [place] NAME: Robert Parks    MR#:  [id number]  DATE OF BIRTH:  [birth year]  SUBJECTIVE:  CHIEF COMPLAINT:   Chief Complaint  Patient presents with  . Leg Swelling  . Shortness of Breath   -Patient with known CK D stage III, admitted with worsening edema and dyspnea. Noted to be in acute on chronic renal failure. -On IV Lasix twice a day. Improving edema and weakness. Creatinine still at 4.4  REVIEW OF SYSTEMS:  Review of Systems  Constitutional: Negative for chills and fever.  HENT: Negative for ear discharge, ear pain and nosebleeds.   Eyes: Negative for blurred vision.  Respiratory: Negative for cough, shortness of breath and wheezing.   Cardiovascular: Positive for leg swelling. Negative for chest pain and palpitations.  Gastrointestinal: Negative for abdominal pain, constipation, diarrhea, nausea and vomiting.  Genitourinary: Negative for dysuria and urgency.  Musculoskeletal: Negative for myalgias and neck pain.  Neurological: Positive for weakness. Negative for dizziness, sensory change, speech change, focal weakness, seizures and headaches.  Psychiatric/Behavioral: Negative for depression and suicidal ideas.    DRUG ALLERGIES:   Allergies  Allergen Reactions  . No Known Allergies Other (See Comments)    VITALS:  Blood pressure 116/66, pulse 69, temperature 98 F (36.7 C), temperature source Oral, resp. rate 18, height 5\' 8"  (1.727 m), weight 68.1 kg (150 lb 1.6 oz), SpO2 96 %.  PHYSICAL EXAMINATION:  Physical Exam  GENERAL:  75 y.o.-year-old patient lying in the bed with no acute distress.  EYES: Pupils equal, round, reactive to light and accommodation. No scleral icterus. Extraocular muscles intact.  HEENT: Head atraumatic, normocephalic. Oropharynx and nasopharynx clear.  NECK:  Supple, no jugular venous distention. No thyroid enlargement, no tenderness.  LUNGS: Normal breath sounds bilaterally, no  wheezing, rales,rhonchi or crepitation. No use of accessory muscles of respiration. Decreased bibasilar breath sounds. CARDIOVASCULAR: S1, S2 normal. No murmurs, rubs, or gallops.  ABDOMEN: Soft, nontender, firm and distended. Bowel sounds present. No organomegaly or mass.  EXTREMITIES: No cyanosis, or clubbing. trace pedal edema present. NEUROLOGIC: Cranial nerves II through XII are intact. Muscle strength 5/5 in all extremities. Sensation intact. Gait not checked.  PSYCHIATRIC: The patient is alert and oriented x 3.  SKIN: No obvious rash, lesion, or ulcer.    LABORATORY PANEL:   CBC  Recent Labs Lab 10/13/15 0643  WBC 2.3*  HGB 9.4*  HCT 27.8*  PLT 56*   ------------------------------------------------------------------------------------------------------------------  Chemistries   Recent Labs Lab 10/11/15 1452 10/12/15 0406  10/13/15 0643  NA 139 138  < > 139  K 5.3* 4.9  < > 4.4  CL 108 107  < > 107  CO2 22 23  < > 23  GLUCOSE 96 78  < > 91  BUN 71* 73*  < > 66*  CREATININE 4.88* 4.94*  < > 4.42*  CALCIUM 9.0 8.8*  < > 8.7*  MG  --  2.4  --   --   AST 26  --   --   --   ALT 15*  --   --   --   ALKPHOS 72  --   --   --   BILITOT 1.0  --   --   --   < > = values in this interval not displayed. ------------------------------------------------------------------------------------------------------------------  Cardiac Enzymes  Recent Labs Lab 10/11/15 2009  TROPONINI 0.13*   ------------------------------------------------------------------------------------------------------------------  RADIOLOGY:  Dg Chest 2 View  Result Date: 10/11/2015 CLINICAL DATA:  Short of breath and leg swelling. Concern for heart failure or pneumonia EXAM: CHEST  2 VIEW COMPARISON:  None. FINDINGS: Postop CABG. Cardiac enlargement. Pulmonary vascular congestion with mild interstitial edema. Small bilateral pleural effusions. Bibasilar atelectasis. IMPRESSION: Congestive heart  failure with mild edema and small bilateral pleural effusions. Bibasilar atelectasis. Electronically Signed   By: Franchot Gallo M.D.   On: 10/11/2015 16:06   US Renal  Result Date: 10/12/2015 CLINICAL DATA:  Acute renal failure EXAM: RENAL / URINARY TRACT ULTRASOUND COMPLETE COMPARISON:  Abdominal ultrasound 05/07/2006 FINDINGS: Right Kidney: Length: 9.6 cm. Echogenicity within normal limits. No mass or hydronephrosis visualized. Left Kidney: Length: Measures 9 cm in length. There is normal echogenicity. No hydronephrosis. There is a hyperechoic cyst in lower pole of the left kidney measures 1.9 x 1.8 cm. Bladder: Appears normal for degree of bladder distention. Small amount of abdominal ascites.  Left pleural effusion is noted. IMPRESSION: 1. No hydronephrosis. No renal calculi. Again noted a cyst in lower pole of the left kidney measures 1.8 x 1.9 cm. On the prior exam measures 1.6 x 1.5 cm. Unremarkable urinary bladder. Small abdominal ascites. Left pleural effusion is noted. Electronically Signed   By: Lahoma Crocker M.D.   On: 10/12/2015 16:09    EKG:   Orders placed or performed during the hospital encounter of 10/11/15  . ED EKG  . ED EKG  . EKG 12-Lead  . EKG 12-Lead    ASSESSMENT AND PLAN:   75 year old male with past medical history significant for CAD, hypertension, diabetes, CK D stage III presents to the hospital secondary to worsening edema and dyspnea.  #1 acute renal failure on CK D stage III-Baseline creatinine seems to be around 2.4, creatinine improved and stable at 4.4 today. -Appreciate nephrology consult. Patient negative by 1200 cc yesterday. -Continue Lasix IV, increase to TID. Continue to monitor urine output. -No acute indication for hemodialysis at this time. -Monitor potassium  #2 acute on chronic systolic CHF exacerbation-known EF of 37%. Appreciate cardiology consult. -Currently being diuresed with Lasix. - Continue statin. Add ACEI or ARB once renal function is  stable. -Low-dose Coreg can be added.  #3 diabetes mellitus-on sliding scale insulin.  #4 BPH-on Flomax  #5 DVT prophylaxis- subcutaneous heparin    All the records are reviewed and case discussed with Care Management/Social Workerr. Management plans discussed with the patient, family and they are in agreement.  CODE STATUS: Full code  TOTAL TIME TAKING CARE OF THIS PATIENT: 37 minutes.   POSSIBLE D/C IN 1-2 DAYS, DEPENDING ON CLINICAL CONDITION.   Gladstone Lighter M.D on 10/13/2015 at 11:08 AM  Between 7am to 6pm - Pager - [phone]  After 6pm go to [email or web address] - password [email or web address] [facility]  Office  [phone]  CC: Primary care physician; [facility]

## 2015-10-13 NOTE — Progress Notes (Signed)
SUBJECTIVE: no chest pain or shortness of breath   Vitals:   10/12/15 1107 10/12/15 1940 10/13/15 0319 10/13/15 0800  BP: 115/64 105/62 (!) 96/52 120/71  Pulse: (!) 56 (!) 54 72 69  Resp: 18 18 18 16   Temp: 97.8 F (36.6 C) 97.7 F (36.5 C) 98.2 F (36.8 C) 98 F (36.7 C)  TempSrc: Oral Oral Oral Oral  SpO2: 99% 100% 99% 100%  Weight:   150 lb 1.6 oz (68.1 kg)   Height:        Intake/Output Summary (Last 24 hours) at 10/13/15 1029 Last data filed at 10/13/15 1002  Gross per 24 hour  Intake              840 ml  Output             1100 ml  Net             -260 ml    LABS: Basic Metabolic Panel:  Recent Labs  10/12/15 0406 10/12/15 1208 10/13/15 0643  NA 138 140 139  K 4.9 4.8 4.4  CL 107 109 107  CO2 23 21* 23  GLUCOSE 78 98 91  BUN 73* 70* 66*  CREATININE 4.94* 4.82* 4.42*  CALCIUM 8.8* 8.8* 8.7*  MG 2.4  --   --    Liver Function Tests:  Recent Labs  10/11/15 1452  AST 26  ALT 15*  ALKPHOS 72  BILITOT 1.0  PROT 6.3*  ALBUMIN 4.0   No results for input(s): LIPASE, AMYLASE in the last 72 hours. CBC:  Recent Labs  10/11/15 1048 10/11/15 1452 10/12/15 0406 10/13/15 0643  WBC 2.9* 2.6* 2.1* 2.3*  NEUTROABS 2.2 2.0  --   --   HGB 9.2* 9.6* 9.3* 9.4*  HCT 27.5* 29.1* 28.1* 27.8*  MCV 95.3 96.3 97.3 96.2  PLT 66* 66* 58* 56*   Cardiac Enzymes:  Recent Labs  10/11/15 1452 10/11/15 2009  TROPONINI 0.15* 0.13*   BNP: Invalid input(s): POCBNP D-Dimer: No results for input(s): DDIMER in the last 72 hours. Hemoglobin A1C: No results for input(s): HGBA1C in the last 72 hours. Fasting Lipid Panel: No results for input(s): CHOL, HDL, LDLCALC, TRIG, CHOLHDL, LDLDIRECT in the last 72 hours. Thyroid Function Tests: No results for input(s): TSH, T4TOTAL, T3FREE, THYROIDAB in the last 72 hours.  Invalid input(s): FREET3 Anemia Panel: No results for input(s): VITAMINB12, FOLATE, FERRITIN, TIBC, IRON, RETICCTPCT in the last 72 hours.   PHYSICAL  EXAM General: Well developed, well nourished, in no acute distress HEENT:  Normocephalic and atramatic Neck:  No JVD.  Lungs: Clear bilaterally to auscultation and percussion. Heart: HRRR . Normal S1 and S2 without gallops or murmurs.  Abdomen: Bowel sounds are positive, abdomen soft and non-tender  Msk:  Back normal, normal gait. Normal strength and tone for age. Extremities: No clubbing, cyanosis or edema.   Neuro: Alert and oriented X 3. Psych:  Good affect, responds appropriately  TELEMETRY:sinus rhythm 62 bpm  ASSESSMENT AND PLAN: congestive heart failure due to systolic dysfunction with left ventricular ejection fraction 37%. Patient is clinically feeling much better after getting Lasix and can be discharged with follow-up in the office next Thursday at 2 PM and will do outpatient stress test to rule out ischemia.  Active Problems:   Acute CHF (congestive heart failure) ([place])   Pressure ulcer    Amayrany Cafaro A, MD, [facility] 10/13/2015 10:29 AM

## 2015-10-13 NOTE — Evaluation (Signed)
Physical Therapy Evaluation Patient Details Name: Robert Parks MRN: [id number] DOB: Sep 30, 1940 Today's Date: 10/13/2015   History of Present Illness  75 yo male with onset of SOB and LE edema was admitted with CHF and noted to have declining troponins and atelectasis.  PMHx:  CKD, CAD, HTN, DM  Clinical Impression  Pt is getting up to walk with PT assistance but is appropriate for RW now.  He is not steady enough nor is he fully aware of current limitations to be safe with his family at home without RW and assistance.  He is appropriate to go home with HHPT when ready and will need to climb steps to be cleared for this.  Continue acutely to strengthen and monitor him with vitals and safety/balance for transition to home.    Follow Up Recommendations Home health PT;Supervision/Assistance - 24 hour;Supervision for mobility/OOB    Equipment Recommendations  None recommended by PT (owns walkers)    Recommendations for Other Services Rehab consult     Precautions / Restrictions Precautions Precautions: Fall Precaution Comments: monitor vitals as needed Restrictions Weight Bearing Restrictions: No      Mobility  Bed Mobility Overal bed mobility: Needs Assistance Bed Mobility: Supine to Sit;Sit to Supine     Supine to sit: Min assist Sit to supine: Min assist      Transfers Overall transfer level: Needs assistance Equipment used: 1 person hand held assist Transfers: Sit to/from [facility] Sit to Stand: Min assist Stand pivot transfers: Min assist       General transfer comment: Pt needs reminders for hand placement on bed to stand and sit  Ambulation/Gait Ambulation/Gait assistance: Min assist Ambulation Distance (Feet): 150 Feet (in two trips with sitting rest) Assistive device: Straight cane Gait Pattern/deviations: Step-to pattern;Step-through pattern;Wide base of support;Shuffle;Decreased stride length Gait velocity: reduced Gait velocity  interpretation: Below normal speed for age/gender General Gait Details: fatigued with effort suddenly, sat on a chair on the hall then after 2 minute rest completed the trip  Stairs            Wheelchair Mobility    Modified Rankin (Stroke Patients Only)       Balance Overall balance assessment: Needs assistance   Sitting balance-Leahy Scale: Good     Standing balance support: Bilateral upper extremity supported Standing balance-Leahy Scale: Fair                               Pertinent Vitals/Pain Pain Assessment: No/denies pain    Home Living Family/patient expects to be discharged to:: Private residence Living Arrangements: Spouse/significant other Available Help at Discharge: Family;Available 24 hours/day Type of Home: House Home Access: Stairs to enter Entrance Stairs-Rails: None Entrance Stairs-Number of Steps: 5 Home Layout: One level Home Equipment: Walker - 2 wheels;Walker - 4 wheels;Cane - single point      Prior Function Level of Independence: Independent with assistive device(s)               Hand Dominance        Extremity/Trunk Assessment   Upper Extremity Assessment: Overall WFL for tasks assessed           Lower Extremity Assessment: Generalized weakness      Cervical / Trunk Assessment: Normal  Communication   Communication: No difficulties  Cognition Arousal/Alertness: Awake/alert Behavior During Therapy: WFL for tasks assessed/performed Overall Cognitive Status: Within Functional Limits for tasks assessed  General Comments General comments (skin integrity, edema, etc.): Pt is tired and has low endurance since admission, needs RW for support to walk safely now.  Provided to him and instructed nursing to use with him.    Exercises        Assessment/Plan    PT Assessment Patient needs continued PT services  PT Diagnosis Difficulty walking   PT Problem List Decreased  strength;Decreased range of motion;Decreased activity tolerance;Decreased balance;Decreased mobility;Decreased coordination;Decreased knowledge of use of DME;Decreased safety awareness;Cardiopulmonary status limiting activity  PT Treatment Interventions DME instruction;Gait training;Stair training;Functional mobility training;Therapeutic activities;Therapeutic exercise;Balance training;Neuromuscular re-education;Patient/family education   PT Goals (Current goals can be found in the Care Plan section) Acute Rehab PT Goals Patient Stated Goal: to get up to walk and go home PT Goal Formulation: With patient/family Time For Goal Achievement: 10/27/15 Potential to Achieve Goals: Good    Frequency Min 2X/week   Barriers to discharge Inaccessible home environment steps no rails    Co-evaluation               End of Session Equipment Utilized During Treatment: Gait belt Activity Tolerance: Patient tolerated treatment well;Patient limited by fatigue Patient left: in bed;with call bell/phone within reach;with bed alarm set;with family/visitor present;with nursing/sitter in room Nurse Communication: Mobility status         Time: [id number] PT Time Calculation (min) (ACUTE ONLY): 33 min   Charges:   PT Evaluation $PT Eval Moderate Complexity: 1 Procedure PT Treatments $Gait Training: 8-22 mins   PT G CodesRamond Dial November 09, 2015, 6:07 PM    Mee Hives, PT MS Acute Rehab Dept. Number: [place] [facility] [place]

## 2015-10-14 LAB — BASIC METABOLIC PANEL
ANION GAP: 9 (ref 5–15)
BUN: 64 mg/dL — AB (ref 6–20)
CHLORIDE: 106 mmol/L (ref 101–111)
CO2: 24 mmol/L (ref 22–32)
Calcium: 8.8 mg/dL — ABNORMAL LOW (ref 8.9–10.3)
Creatinine, Ser: 4.35 mg/dL — ABNORMAL HIGH (ref 0.61–1.24)
GFR calc Af Amer: 14 mL/min — ABNORMAL LOW (ref >60)
GFR calc non Af Amer: 12 mL/min — ABNORMAL LOW (ref >60)
GLUCOSE: 93 mg/dL (ref 65–99)
POTASSIUM: 3.9 mmol/L (ref 3.5–5.1)
SODIUM: 139 mmol/L (ref 135–145)

## 2015-10-14 LAB — GLUCOSE, CAPILLARY
GLUCOSE-CAPILLARY: 105 mg/dL — AB (ref 65–99)
GLUCOSE-CAPILLARY: 124 mg/dL — AB (ref 65–99)
Glucose-Capillary: 119 mg/dL — ABNORMAL HIGH (ref 65–99)
Glucose-Capillary: 122 mg/dL — ABNORMAL HIGH (ref 65–99)

## 2015-10-14 LAB — SODIUM, URINE, RANDOM: Sodium, Ur: 88 mmol/L

## 2015-10-14 NOTE — Progress Notes (Signed)
SUBJECTIVE: Patient denies any chest pain or shortness of breath   Vitals:   10/14/15 0424 10/14/15 0500 10/14/15 0800 10/14/15 1152  BP: (!) 105/56  116/66 (!) 97/58  Pulse: 77  73 78  Resp: 18  18 16   Temp: 98.2 F (36.8 C)  98.1 F (36.7 C) 97.6 F (36.4 C)  TempSrc:   Oral   SpO2: 100%  96% 100%  Weight:  151 lb 9.6 oz (68.8 kg)    Height:        Intake/Output Summary (Last 24 hours) at 10/14/15 1205 Last data filed at 10/14/15 0955  Gross per 24 hour  Intake              720 ml  Output             1450 ml  Net             -730 ml    LABS: Basic Metabolic Panel:  Recent Labs  10/12/15 0406  10/13/15 0643 10/14/15 0513  NA 138  < > 139 139  K 4.9  < > 4.4 3.9  CL 107  < > 107 106  CO2 23  < > 23 24  GLUCOSE 78  < > 91 93  BUN 73*  < > 66* 64*  CREATININE 4.94*  < > 4.42* 4.35*  CALCIUM 8.8*  < > 8.7* 8.8*  MG 2.4  --   --   --   < > = values in this interval not displayed. Liver Function Tests:  Recent Labs  10/11/15 1452  AST 26  ALT 15*  ALKPHOS 72  BILITOT 1.0  PROT 6.3*  ALBUMIN 4.0   No results for input(s): LIPASE, AMYLASE in the last 72 hours. CBC:  Recent Labs  10/11/15 1452 10/12/15 0406 10/13/15 0643  WBC 2.6* 2.1* 2.3*  NEUTROABS 2.0  --   --   HGB 9.6* 9.3* 9.4*  HCT 29.1* 28.1* 27.8*  MCV 96.3 97.3 96.2  PLT 66* 58* 56*   Cardiac Enzymes:  Recent Labs  10/11/15 1452 10/11/15 2009  TROPONINI 0.15* 0.13*   BNP: Invalid input(s): POCBNP D-Dimer: No results for input(s): DDIMER in the last 72 hours. Hemoglobin A1C: No results for input(s): HGBA1C in the last 72 hours. Fasting Lipid Panel: No results for input(s): CHOL, HDL, LDLCALC, TRIG, CHOLHDL, LDLDIRECT in the last 72 hours. Thyroid Function Tests: No results for input(s): TSH, T4TOTAL, T3FREE, THYROIDAB in the last 72 hours.  Invalid input(s): FREET3 Anemia Panel: No results for input(s): VITAMINB12, FOLATE, FERRITIN, TIBC, IRON, RETICCTPCT in the last 72  hours.   PHYSICAL EXAM General: Well developed, well nourished, in no acute distress HEENT:  Normocephalic and atramatic Neck:  No JVD.  Lungs: Clear bilaterally to auscultation and percussion. Heart: HRRR . Normal S1 and S2 without gallops or murmurs.  Abdomen: Bowel sounds are positive, abdomen soft and non-tender  Msk:  Back normal, normal gait. Normal strength and tone for age. Extremities: No clubbing, cyanosis or edema.   Neuro: Alert and oriented X 3. Psych:  Good affect, responds appropriately  TELEMETRY:Sinus rhythm  ASSESSMENT AND PLAN: Acute congestive heart failure with renal failure and mildly elevated troponin. IV Lasix has improved the symptoms and probably need to rule out coronary artery disease as an outpatient by doing a stress test he did should be done  Active Problems:   Acute CHF (congestive heart failure) ([place])   Pressure ulcer    Danaly Bari A, MD, [facility]  10/14/2015 12:05 PM

## 2015-10-14 NOTE — Progress Notes (Signed)
[place] at [place] NAME: Robert Parks    MR#:  [id number]  DATE OF BIRTH:  1940-12-12  SUBJECTIVE:  CHIEF COMPLAINT:   Chief Complaint  Patient presents with  . Leg Swelling  . Shortness of Breath   -Patient with known CK D stage III, admitted with worsening edema and dyspnea.  -Renal function still worse than baseline, but stable. -Continue IV Lasix. Complains of weakness today  REVIEW OF SYSTEMS:  Review of Systems  Constitutional: Negative for chills and fever.  HENT: Negative for ear discharge, ear pain and nosebleeds.   Eyes: Negative for blurred vision.  Respiratory: Negative for cough, shortness of breath and wheezing.   Cardiovascular: Positive for leg swelling. Negative for chest pain and palpitations.  Gastrointestinal: Negative for abdominal pain, constipation, diarrhea, nausea and vomiting.  Genitourinary: Negative for dysuria and urgency.  Musculoskeletal: Negative for myalgias and neck pain.  Neurological: Positive for weakness. Negative for dizziness, sensory change, speech change, focal weakness, seizures and headaches.  Psychiatric/Behavioral: Negative for depression and suicidal ideas.    DRUG ALLERGIES:   Allergies  Allergen Reactions  . No Known Allergies Other (See Comments)    VITALS:  Blood pressure (!) 97/58, pulse 78, temperature 97.6 F (36.4 C), resp. rate 16, height 5\' 8"  (1.727 m), weight 68.8 kg (151 lb 9.6 oz), SpO2 100 %.  PHYSICAL EXAMINATION:  Physical Exam  GENERAL:  75 y.o.-year-old patient lying in the bed with no acute distress.  EYES: Pupils equal, round, reactive to light and accommodation. No scleral icterus. Extraocular muscles intact.  HEENT: Head atraumatic, normocephalic. Oropharynx and nasopharynx clear.  NECK:  Supple, no jugular venous distention. No thyroid enlargement, no tenderness.  LUNGS: Normal breath sounds bilaterally, no wheezing, rales,rhonchi or crepitation. No  use of accessory muscles of respiration. Decreased bibasilar breath sounds. CARDIOVASCULAR: S1, S2 normal. No murmurs, rubs, or gallops.  ABDOMEN: Soft, nontender, distended but softer today. Bowel sounds present. No organomegaly or mass.  EXTREMITIES: No cyanosis, or clubbing. trace pedal edema present. NEUROLOGIC: Cranial nerves II through XII are intact. Muscle strength 5/5 in all extremities. Sensation intact. Gait not checked.  PSYCHIATRIC: The patient is alert and oriented x 3.  SKIN: No obvious rash, lesion, or ulcer.    LABORATORY PANEL:   CBC  Recent Labs Lab 10/13/15 0643  WBC 2.3*  HGB 9.4*  HCT 27.8*  PLT 56*   ------------------------------------------------------------------------------------------------------------------  Chemistries   Recent Labs Lab 10/11/15 1452 10/12/15 0406  10/14/15 0513  NA 139 138  < > 139  K 5.3* 4.9  < > 3.9  CL 108 107  < > 106  CO2 22 23  < > 24  GLUCOSE 96 78  < > 93  BUN 71* 73*  < > 64*  CREATININE 4.88* 4.94*  < > 4.35*  CALCIUM 9.0 8.8*  < > 8.8*  MG  --  2.4  --   --   AST 26  --   --   --   ALT 15*  --   --   --   ALKPHOS 72  --   --   --   BILITOT 1.0  --   --   --   < > = values in this interval not displayed. ------------------------------------------------------------------------------------------------------------------  Cardiac Enzymes  Recent Labs Lab 10/11/15 2009  TROPONINI 0.13*   ------------------------------------------------------------------------------------------------------------------  RADIOLOGY:  US Renal  Result Date: 10/12/2015 CLINICAL DATA:  Acute renal failure EXAM:  RENAL / URINARY TRACT ULTRASOUND COMPLETE COMPARISON:  Abdominal ultrasound 05/07/2006 FINDINGS: Right Kidney: Length: 9.6 cm. Echogenicity within normal limits. No mass or hydronephrosis visualized. Left Kidney: Length: Measures 9 cm in length. There is normal echogenicity. No hydronephrosis. There is a hyperechoic cyst  in lower pole of the left kidney measures 1.9 x 1.8 cm. Bladder: Appears normal for degree of bladder distention. Small amount of abdominal ascites.  Left pleural effusion is noted. IMPRESSION: 1. No hydronephrosis. No renal calculi. Again noted a cyst in lower pole of the left kidney measures 1.8 x 1.9 cm. On the prior exam measures 1.6 x 1.5 cm. Unremarkable urinary bladder. Small abdominal ascites. Left pleural effusion is noted. Electronically Signed   By: Lahoma Crocker M.D.   On: 10/12/2015 16:09    EKG:   Orders placed or performed during the hospital encounter of 10/11/15  . ED EKG  . ED EKG  . EKG 12-Lead  . EKG 12-Lead    ASSESSMENT AND PLAN:   75 year old male with past medical history significant for CAD, hypertension, diabetes, CK D stage III presents to the hospital secondary to worsening edema and dyspnea.  #1 acute renal failure on CK D stage III-Baseline creatinine seems to be around 2.4, creatinine Now stable at 4.3 today. -Appreciate nephrology consult.  -continue to diurese with Lasix IV 3 times a day today. Urine output about 1500 cc yesterday. Minimal today so far. Bladder scan if he does not void. -Renal ultrasound with no obstruction or hydronephrosis. -Appreciate nephrology consult -No acute indication for hemodialysis at this time. -Monitor potassium  #2 acute on chronic systolic CHF exacerbation-known EF of 37%. Appreciate cardiology consult. -Currently being diuresed with Lasix. - Continue statin. Add ACEI or ARB once renal function is stable. -Low-dose Coreg is now added. -Outpatient stress test by cardiology  #3 diabetes mellitus-on sliding scale insulin.  #4 BPH-on Flomax  #5 DVT prophylaxis- subcutaneous heparin    All the records are reviewed and case discussed with Care Management/Social Workerr. Management plans discussed with the patient, family and they are in agreement.  CODE STATUS: Full code  TOTAL TIME TAKING CARE OF THIS PATIENT: 37  minutes.   POSSIBLE D/C IN 1-2 DAYS, DEPENDING ON CLINICAL CONDITION.   Naje Rice M.D on 10/14/2015 at 12:51 PM  Between 7am to 6pm - Pager - [phone]  After 6pm go to [email or web address] - password [email or web address] [place] [facility]  Office  [phone]  CC: Primary care physician; [facility]

## 2015-10-15 LAB — BASIC METABOLIC PANEL
Anion gap: 12 (ref 5–15)
BUN: 60 mg/dL — ABNORMAL HIGH (ref 6–20)
CHLORIDE: 104 mmol/L (ref 101–111)
CO2: 22 mmol/L (ref 22–32)
CREATININE: 3.71 mg/dL — AB (ref 0.61–1.24)
Calcium: 8.7 mg/dL — ABNORMAL LOW (ref 8.9–10.3)
GFR calc non Af Amer: 15 mL/min — ABNORMAL LOW (ref >60)
GFR, EST AFRICAN AMERICAN: 17 mL/min — AB (ref >60)
Glucose, Bld: 114 mg/dL — ABNORMAL HIGH (ref 65–99)
Potassium: 3.6 mmol/L (ref 3.5–5.1)
Sodium: 138 mmol/L (ref 135–145)

## 2015-10-15 LAB — GLUCOSE, CAPILLARY
GLUCOSE-CAPILLARY: 107 mg/dL — AB (ref 65–99)
GLUCOSE-CAPILLARY: 124 mg/dL — AB (ref 65–99)
GLUCOSE-CAPILLARY: 124 mg/dL — AB (ref 65–99)
Glucose-Capillary: 107 mg/dL — ABNORMAL HIGH (ref 65–99)

## 2015-10-15 MED ORDER — SODIUM CHLORIDE 0.9% FLUSH
3.0000 mL | INTRAVENOUS | Status: DC | PRN
  Administered 2015-10-15: 3 mL via INTRAVENOUS

## 2015-10-15 MED ORDER — FUROSEMIDE 40 MG PO TABS
80.0000 mg | ORAL_TABLET | Freq: Two times a day (BID) | ORAL | Status: DC
  Administered 2015-10-15 – 2015-10-16 (×2): 80 mg via ORAL
  Filled 2015-10-15 (×2): qty 2

## 2015-10-15 MED ORDER — SODIUM CHLORIDE 0.9% FLUSH
3.0000 mL | Freq: Two times a day (BID) | INTRAVENOUS | Status: DC
  Administered 2015-10-15 – 2015-10-16 (×2): 3 mL via INTRAVENOUS

## 2015-10-15 MED ORDER — HYDROCORTISONE 1 % EX CREA
TOPICAL_CREAM | Freq: Three times a day (TID) | CUTANEOUS | Status: DC
  Administered 2015-10-15: 1 via TOPICAL
  Administered 2015-10-15 – 2015-10-16 (×2): via TOPICAL
  Filled 2015-10-15: qty 28

## 2015-10-15 NOTE — Progress Notes (Signed)
Robert Parks NAME: Robert Parks    MR#:  [id number]  DATE OF BIRTH:  1940-10-24  SUBJECTIVE:   Patient still having dyspnea exertion. His lower extremity edema has improved. His abdominal girth has also improved.  REVIEW OF SYSTEMS:    Review of Systems  Constitutional: Negative.  Negative for chills, fever and malaise/fatigue.  HENT: Negative.  Negative for ear discharge, ear pain, hearing loss, nosebleeds and sore throat.   Eyes: Negative.  Negative for blurred vision and pain.  Respiratory: Positive for shortness of breath. Negative for cough, hemoptysis and wheezing.   Cardiovascular: Positive for leg swelling. Negative for chest pain and palpitations.  Gastrointestinal: Negative.  Negative for abdominal pain, blood in stool, diarrhea, nausea and vomiting.  Genitourinary: Negative.  Negative for dysuria.  Musculoskeletal: Negative.  Negative for back pain.  Skin: Negative.   Neurological: Negative for dizziness, tremors, speech change, focal weakness, seizures and headaches.  Endo/Heme/Allergies: Negative.  Does not bruise/bleed easily.  Psychiatric/Behavioral: Negative.  Negative for depression, hallucinations and suicidal ideas.    Tolerating Diet: yes      DRUG ALLERGIES:   Allergies  Allergen Reactions  . No Known Allergies Other (See Comments)    VITALS:  Blood pressure 109/64, pulse 84, temperature 98 F (36.7 C), resp. rate 20, height 5\' 8"  (1.727 m), weight 67.9 kg (149 lb 12.8 oz), SpO2 100 %.  PHYSICAL EXAMINATION:   Physical Exam  Constitutional: He is oriented to person, place, and time and well-developed, well-nourished, and in no distress. No distress.  HENT:  Head: Normocephalic.  Eyes: No scleral icterus.  Neck: Normal range of motion. Neck supple. No JVD present. No tracheal deviation present.  Cardiovascular: Normal rate, regular rhythm and normal heart sounds.  Exam reveals no gallop and no  friction rub.   No murmur heard. Pulmonary/Chest: Effort normal and breath sounds normal. No respiratory distress. He has no wheezes. He has no rales. He exhibits no tenderness.  Abdominal: Soft. Bowel sounds are normal. He exhibits no distension and no mass. There is no tenderness. There is no rebound and no guarding.  Musculoskeletal: Normal range of motion. He exhibits edema.  Neurological: He is alert and oriented to person, place, and time.  Skin: Skin is warm. No rash noted. No erythema.  Psychiatric: Affect and judgment normal.      LABORATORY PANEL:   CBC  Recent Labs Lab 10/13/15 0643  WBC 2.3*  HGB 9.4*  HCT 27.8*  PLT 56*   ------------------------------------------------------------------------------------------------------------------  Chemistries   Recent Labs Lab 10/11/15 1452 10/12/15 0406  10/15/15 0613  NA 139 138  < > 138  K 5.3* 4.9  < > 3.6  CL 108 107  < > 104  CO2 22 23  < > 22  GLUCOSE 96 78  < > 114*  BUN 71* 73*  < > 60*  CREATININE 4.88* 4.94*  < > 3.71*  CALCIUM 9.0 8.8*  < > 8.7*  MG  --  2.4  --   --   AST 26  --   --   --   ALT 15*  --   --   --   ALKPHOS 72  --   --   --   BILITOT 1.0  --   --   --   < > = values in this interval not displayed. ------------------------------------------------------------------------------------------------------------------  Cardiac Enzymes  Recent Labs Lab 10/11/15 1452 10/11/15 2009  TROPONINI 0.15*  0.13*   ------------------------------------------------------------------------------------------------------------------  RADIOLOGY:  No results found.   ASSESSMENT AND PLAN:   75 year old male with a history of CAD, essential hypertension, diabetes chronic kidney disease stage III who presented with dyspnea exertion and lower extreme edema.  1. Acute on chronic CHF exacerbation, patient with previous echocardiogram showing diastolic heart failure now with both diastolic( stage 2)  and  systolic heart failure EF 35% : Patient is diuresing fairly well with IV Lasix. I suggest continue with IV Lasix through today and perhaps transitioning to oral medication tomorrow. Continue low-dose Coreg. Consider adding ACE inhibitor or ARB once renal function is stable.  He will need outpatient stress test by cardiology.  2. Acute on chronic renal failure stage III: Etiology is due to cardiorenal syndrome from poor perfusion due to problem #1.  Baseline creatinine is 2.4. Creatinine is improving with diuresis, no acute indication for dialysis at this time. Nephrology consult Renal ultrasound showed no evidence of hydronephrosis or obstruction.  3. Diabetes: Continue ADA diet and sinus [place].  4. BPH: Continue Flomax   5. Cervical stenosis: Patient has been evaluated by neurosurgery and plan for decompression once stabilized.  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  7. History of ASCVD: Continue Coreg, aspirin, Lipitor and Ranexa.  8. Difficulty swallowing related to cervical stenosis: Continue dysphagia 3 diet.  Management plans discussed with the patient and he is in agreement.  CODE STATUS: full  TOTAL TIME TAKING CARE OF THIS PATIENT: 26 minutes.     POSSIBLE D/C 1-2 days, DEPENDING ON CLINICAL CONDITION.   Robert Parks M.D on 10/15/2015 at 1:17 PM  Between 7am to 6pm - Pager - [phone] After 6pm go to [email or web address] - password [email or web address] [facility]  Office  [phone]  CC: Primary care physician; [facility]  Note: This dictation was prepared with Dragon dictation along with smaller phrase technology. Any transcriptional errors that result from this process are unintentional.

## 2015-10-15 NOTE — Progress Notes (Signed)
SUBJECTIVE: Patient denies any chest pain or shortness of breath but needed to have Foley catheter in inserted to have diuresis.   Vitals:   10/14/15 1834 10/14/15 1836 10/14/15 2000 10/15/15 0409  BP: 112/64 112/64 115/71 113/67  Pulse: 86 86 87 95  Resp:      Temp:   98.2 F (36.8 C) 97.1 F (36.2 C)  TempSrc:   Oral Oral  SpO2:  98% 99% 100%  Weight:    149 lb 12.8 oz (67.9 kg)  Height:        Intake/Output Summary (Last 24 hours) at 10/15/15 0754 Last data filed at 10/15/15 [id number]  Gross per 24 hour  Intake              720 ml  Output             1325 ml  Net             -605 ml    LABS: Basic Metabolic Panel:  Recent Labs  10/14/15 0513 10/15/15 0613  NA 139 138  K 3.9 3.6  CL 106 104  CO2 24 22  GLUCOSE 93 114*  BUN 64* 60*  CREATININE 4.35* 3.71*  CALCIUM 8.8* 8.7*   Liver Function Tests: No results for input(s): AST, ALT, ALKPHOS, BILITOT, PROT, ALBUMIN in the last 72 hours. No results for input(s): LIPASE, AMYLASE in the last 72 hours. CBC:  Recent Labs  10/13/15 0643  WBC 2.3*  HGB 9.4*  HCT 27.8*  MCV 96.2  PLT 56*   Cardiac Enzymes: No results for input(s): CKTOTAL, CKMB, CKMBINDEX, TROPONINI in the last 72 hours. BNP: Invalid input(s): POCBNP D-Dimer: No results for input(s): DDIMER in the last 72 hours. Hemoglobin A1C: No results for input(s): HGBA1C in the last 72 hours. Fasting Lipid Panel: No results for input(s): CHOL, HDL, LDLCALC, TRIG, CHOLHDL, LDLDIRECT in the last 72 hours. Thyroid Function Tests: No results for input(s): TSH, T4TOTAL, T3FREE, THYROIDAB in the last 72 hours.  Invalid input(s): FREET3 Anemia Panel: No results for input(s): VITAMINB12, FOLATE, FERRITIN, TIBC, IRON, RETICCTPCT in the last 72 hours.   PHYSICAL EXAM General: Well developed, well nourished, in no acute distress HEENT:  Normocephalic and atramatic Neck:  No JVD.  Lungs: Clear bilaterally to auscultation and percussion. Heart: HRRR . Normal S1  and S2 without gallops or murmurs.  Abdomen: Bowel sounds are positive, abdomen soft and non-tender  Msk:  Back normal, normal gait. Normal strength and tone for age. Extremities: No clubbing, cyanosis or edema.   Neuro: Alert and oriented X 3. Psych:  Good affect, responds appropriately  TELEMETRY:Sinus rhythm  ASSESSMENT AND PLAN: Congestive heart failure with moderate to severe LV dysfunction left ventricle ejection fraction 37%. Also has renal failure and needing dialysis. Clinically patient is much better since she's been admitted and can go home with follow-up in the office on Thursday at 2 PM.  Active Problems:   Acute CHF (congestive heart failure) ([place])   Pressure ulcer    Robert Parks A, MD, [facility] 10/15/2015 7:54 AM

## 2015-10-15 NOTE — Progress Notes (Signed)
Pt voiding frequently in small amounts.  Bladder scan done and >500 ml.  Dr Estanislado Pandy made aware.  In and Out order given at this time.

## 2015-10-15 NOTE — Progress Notes (Signed)
Physical Therapy Treatment Patient Details Name: Robert Parks MRN: [id number] DOB: 15-Jul-1940 Today's Date: 10/15/2015    History of Present Illness 75 yo male with onset of SOB and LE edema was admitted with CHF and noted to have declining troponins and atelectasis.  PMHx:  CKD, CAD, HTN, DM    PT Comments    Pt sitting edge of bed eating breakfast upon arrival.  He was able to stand and ambulate 50' x 3 with rolling walker and min assist.  Wheelchair follow was used as pt fatigues quickly and needs to sit.  Gait generally steady but pt has poor general safety and requires verbal reminders to reach back before sitting.  Fatigued with gait and stairs were declined by pt.  Will try stairs tomorrow.   Follow Up Recommendations  Home health PT;Supervision/Assistance - 24 hour;Supervision for mobility/OOB     Equipment Recommendations  Rolling walker with 5" wheels    Recommendations for Other Services       Precautions / Restrictions Precautions Precautions: Fall Precaution Comments: monitor vitals as needed Restrictions Weight Bearing Restrictions: No    Mobility  Bed Mobility Overal bed mobility: Modified Independent Bed Mobility: Sit to Supine       Sit to supine: Min assist      Transfers Overall transfer level: Needs assistance Equipment used: Rolling walker (2 wheeled) Transfers: Sit to/from Stand Sit to Stand: Min assist            Ambulation/Gait Ambulation/Gait assistance: Min assist Ambulation Distance (Feet): 50 Feet (x 3) Assistive device: Rolling walker (2 wheeled) Gait Pattern/deviations: Step-to pattern Gait velocity: reduced Gait velocity interpretation: Below normal speed for age/gender General Gait Details: fatigues and sits quickly    Stairs            Wheelchair Mobility    Modified Rankin (Stroke Patients Only)       Balance Overall balance assessment: Needs assistance Sitting-balance support: Feet supported Sitting  balance-Leahy Scale: Good     Standing balance support: No upper extremity supported Standing balance-Leahy Scale: Fair                      Cognition Arousal/Alertness: Awake/alert Behavior During Therapy: WFL for tasks assessed/performed Overall Cognitive Status: Within Functional Limits for tasks assessed                      Exercises      General Comments        Pertinent Vitals/Pain Pain Assessment: No/denies pain    Home Living                      Prior Function            PT Goals (current goals can now be found in the care plan section) Progress towards PT goals: Progressing toward goals    Frequency  Min 2X/week    PT Plan Current plan remains appropriate    Co-evaluation             End of Session Equipment Utilized During Treatment: Gait belt Activity Tolerance: Patient tolerated treatment well;Patient limited by fatigue Patient left: in bed;with call bell/phone within reach;with bed alarm set;with family/visitor present;with nursing/sitter in room     Time: [id number] PT Time Calculation (min) (ACUTE ONLY): 26 min  Charges:  $Gait Training: 23-37 mins  G Codes:      Chesley Noon 10/15/2015, 11:18 AM

## 2015-10-15 NOTE — Care Management Important Message (Signed)
Important Message  Patient Details  Name: Robert Parks MRN: [id number] Date of Birth: [birth year]   Medicare Important Message Given:  Yes    Jolly Mango, RN 10/15/2015, 9:22 AM

## 2015-10-15 NOTE — Consult Note (Signed)
S: Patient and wife admit that appetite is better. Edema is better. He had to be catherized for urine this morning. He has not voided since although feels an urge to go now. He ambulated with therapy today but still feels SOB. No CP  O: Temp 98  BP 109/64  P 84  R 20  100 RA I/O 720/1.3  Exam: Elderly, NAD Lungs: improved, better aeration CV: No rub Abd: distended Ext: decreased edema but present in ankles  LABS: Reviewed eGFR 17 ml/min  A/P: [name] with AoCKD, volume overload, cardiac disease, BPH/prostatism with urinary retention  1) AoCKD- no plans for dialysis. Please trial the patient on an oral regimen of Lasix 80 mg bid and see how he responds before sending home to ensure that he continues to diurese. I reviewed his renal US. I am concerned about urinary retention and I have spoken with Dr Benjie Karvonen and recommend monitoring of post-void residuals q8. He may need a foley placement if PVR's are high/urology eval. Of course, urinary retention will hamper recovery of renal function and impede diuresis.  2) Anemia  3) CKD3 at baseline  4) HTN- avoid overcontrol of BP. Currrently on low dose of Coreg and Lasix  Patient has f/up scheduled in the [facility] Nephrology Office on Sept 7 at [place] with Dr Juanito Doom. The patient should have a BMP checked 2 days after discharge and again prior to his visit on 9/7 with Dr Juanito Doom. Results can be faxed to [phone]. Please page [phone] with any questions. I will not see the patient on 8/22 and the patient, his wife and Dr Benjie Karvonen were made aware of this.

## 2015-10-15 NOTE — Progress Notes (Signed)
Speech Therapy Note: reviewed chart notes; consulted NSG re: pt's status today. NSG denied any deficits w/ swallowing pills and/or meals - no reports of dysphagia. Pt is currently on a Dysphagia 3 diet w/ thin liquids.  ST services will f/u w/ pt's status tomorrow; education as needed. NSG agreed.

## 2015-10-16 LAB — BASIC METABOLIC PANEL
Anion gap: 7 (ref 5–15)
BUN: 58 mg/dL — AB (ref 6–20)
CHLORIDE: 104 mmol/L (ref 101–111)
CO2: 28 mmol/L (ref 22–32)
CREATININE: 3.78 mg/dL — AB (ref 0.61–1.24)
Calcium: 8.4 mg/dL — ABNORMAL LOW (ref 8.9–10.3)
GFR calc Af Amer: 17 mL/min — ABNORMAL LOW (ref >60)
GFR calc non Af Amer: 14 mL/min — ABNORMAL LOW (ref >60)
GLUCOSE: 99 mg/dL (ref 65–99)
POTASSIUM: 3.6 mmol/L (ref 3.5–5.1)
Sodium: 139 mmol/L (ref 135–145)

## 2015-10-16 LAB — GLUCOSE, CAPILLARY
Glucose-Capillary: 133 mg/dL — ABNORMAL HIGH (ref 65–99)
Glucose-Capillary: 127 mg/dL — ABNORMAL HIGH (ref 65–99)

## 2015-10-16 MED ORDER — FUROSEMIDE 80 MG PO TABS: 80.0000 mg | ORAL_TABLET | Freq: Two times a day (BID) | ORAL | 0 refills | Status: AC

## 2015-10-16 MED ORDER — CARVEDILOL 3.125 MG PO TABS: 3.1250 mg | ORAL_TABLET | Freq: Two times a day (BID) | ORAL | 0 refills | Status: AC

## 2015-10-16 MED ORDER — RANOLAZINE ER 500 MG PO TB12: 500.0000 mg | ORAL_TABLET | Freq: Two times a day (BID) | ORAL | 0 refills | Status: AC

## 2015-10-16 NOTE — Progress Notes (Signed)
Patient is discharging home with foley catheter until seen by outpatient urologist. Patient and wife educated on foley catheter care to prevent infection.Emphasis on peri care  Made and  to call PCP if experiencing bladder discomfort,pain,discgarge.Educated on how to empty bag,bag should not touch the floor,should be below bladder at all times.

## 2015-10-16 NOTE — Progress Notes (Signed)
Patient discharged home as ordered,instructions explained and well understood,foley catheter education done and well understood,escorted by spouse and volunteer via wheel chair.

## 2015-10-16 NOTE — Progress Notes (Signed)
Physical Therapy Treatment Patient Details Name: Josehua Litalien MRN: [id number] DOB: [birth year] Today's Date: 10/16/2015    History of Present Illness 75 yo male with onset of SOB and LE edema was admitted with CHF and noted to have declining troponins and atelectasis.  PMHx:  CKD, CAD, HTN, DM    PT Comments    Pt seen for ambulation/stair training to ensure ambulation/stair climbing safety for proper discharge recommendation, as pt has discharge order today. Pt wishes to ambulate with single point cane, as he notes he does at home. Pt becomes fatigued at 38 ft requiring stand rest, but feels he can continue short distance to steps and perform steps. Pt is only able to tolerate up 5 steps with increasing need for assist from Lib[place] Negron Torres guard to Mod assist on last step. Pt is unable to descend without bilateral knees buckling. Pt lowered to step and scoots down. Pt is able to rise to feet with Mod A and ambulates with cane then cane and railing for 10 feet. Pt unable to maintain knees straight to ambulate with cane or rolling walker requiring a chair. Pt returned to room and after several minutes is able to stand with Min A and take 2 steps to bed. Pt denies feeling any worse today than yesterday, or any other adverse symptoms. Attempted to contact CM regarding home with 24 hours assist recommendation, as therapist does not feel pt is safe enough to return home with this quick fatigue and level of assist required for mobility. Current recommendation is skilled nursing facility. Voice message left on CM line regarding concerns and recommendation change. Continue PT to progress strength, safety, and endurance to improve all functional mobility.   Follow Up Recommendations  SNF     Equipment Recommendations  Rolling walker with 5" wheels    Recommendations for Other Services       Precautions / Restrictions Precautions Precautions: Fall Restrictions Weight Bearing Restrictions: No    Mobility  Bed  Mobility Overal bed mobility: Modified Independent Bed Mobility: Supine to Sit     Supine to sit: Modified independent (Device/Increase time) Sit to supine: Modified independent (Device/Increase time)   General bed mobility comments: Mild increased time  Transfers Overall transfer level: Needs assistance Equipment used: Straight cane Transfers: Sit to/from Stand Sit to Stand: Min guard         General transfer comment: Good use of hands; no physical assist required  Ambulation/Gait Ambulation/Gait assistance: Min guard;Mod assist Ambulation Distance (Feet): 38 Feet Assistive device: Straight cane Gait Pattern/deviations: Step-through pattern;Decreased step length - right;Decreased step length - left;Trunk flexed Gait velocity: reduced Gait velocity interpretation: Below normal speed for age/gender General Gait Details: Pt initially walking with Min guard. At 38 feet pt takes stand rest break, but feels he can continue to steps (approximately 12 more feet). Pt does so with continued Min guard. Post going up steps, pt requires increased assist at last step and seated rest. Unable to ambulate further without Mod assist and B knee buckling    Stairs Stairs: Yes Stairs assistance:  (Initial Min G, at 5th step Mod A requires seat and scoot dwn) Stair Management: One rail Left;With cane Number of Stairs: 5 General stair comments: Only makes up steps with increased assist needed at last step. Pt knees buckling and unable to control to descend therefore lowered to seated position to scoot down steps. Pt unable to ambulate post without Mod to Max A to maintain stand due to B knee buckling despite attempt  to use cane and rail/rw.   Wheelchair Mobility    Modified Rankin (Stroke Patients Only)       Balance Overall balance assessment: Needs assistance Sitting-balance support: Feet supported Sitting balance-Leahy Scale: Good     Standing balance support: Single extremity  supported;Bilateral upper extremity supported Standing balance-Leahy Scale: Poor (Initially fair with quick decline to poor)                      Cognition Arousal/Alertness: Awake/alert Behavior During Therapy: WFL for tasks assessed/performed Overall Cognitive Status: Within Functional Limits for tasks assessed                      Exercises      General Comments        Pertinent Vitals/Pain Pain Assessment: No/denies pain    Home Living                      Prior Function            PT Goals (current goals can now be found in the care plan section) Progress towards PT goals: Not progressing toward goals - comment    Frequency  Min 2X/week    PT Plan Discharge plan needs to be updated    Co-evaluation             End of Session Equipment Utilized During Treatment: Gait belt Activity Tolerance: Other (comment);Patient limited by fatigue (limited by BLE weakness) Patient left: in bed;with call bell/phone within reach;with nursing/sitter in room;with family/visitor present     Time: [id number] PT Time Calculation (min) (ACUTE ONLY): 25 min  Charges:  $Gait Training: 23-37 mins                    G Codes:      Charlaine Dalton, PTA 10/16/2015, 12:13 PM

## 2015-10-16 NOTE — Progress Notes (Signed)
SUBJECTIVE: Patient is feeling much better   Vitals:   10/15/15 1128 10/15/15 1919 10/16/15 0426 10/16/15 0500  BP: 109/64 (!) 100/58 (!) 102/57   Pulse: 84 79 79   Resp: 20 16 15    Temp: 98 F (36.7 C) 98.2 F (36.8 C) 97.7 F (36.5 C)   TempSrc:  Oral    SpO2: 100% 100% 99%   Weight:    148 lb 12.8 oz (67.5 kg)  Height:        Intake/Output Summary (Last 24 hours) at 10/16/15 0811 Last data filed at 10/16/15 0100  Gross per 24 hour  Intake              240 ml  Output             1000 ml  Net             -760 ml    LABS: Basic Metabolic Panel:  Recent Labs  10/15/15 0613 10/16/15 0436  NA 138 139  K 3.6 3.6  CL 104 104  CO2 22 28  GLUCOSE 114* 99  BUN 60* 58*  CREATININE 3.71* 3.78*  CALCIUM 8.7* 8.4*   Liver Function Tests: No results for input(s): AST, ALT, ALKPHOS, BILITOT, PROT, ALBUMIN in the last 72 hours. No results for input(s): LIPASE, AMYLASE in the last 72 hours. CBC: No results for input(s): WBC, NEUTROABS, HGB, HCT, MCV, PLT in the last 72 hours. Cardiac Enzymes: No results for input(s): CKTOTAL, CKMB, CKMBINDEX, TROPONINI in the last 72 hours. BNP: Invalid input(s): POCBNP D-Dimer: No results for input(s): DDIMER in the last 72 hours. Hemoglobin A1C: No results for input(s): HGBA1C in the last 72 hours. Fasting Lipid Panel: No results for input(s): CHOL, HDL, LDLCALC, TRIG, CHOLHDL, LDLDIRECT in the last 72 hours. Thyroid Function Tests: No results for input(s): TSH, T4TOTAL, T3FREE, THYROIDAB in the last 72 hours.  Invalid input(s): FREET3 Anemia Panel: No results for input(s): VITAMINB12, FOLATE, FERRITIN, TIBC, IRON, RETICCTPCT in the last 72 hours.   PHYSICAL EXAM General: Well developed, well nourished, in no acute distress HEENT:  Normocephalic and atramatic Neck:  No JVD.  Lungs: Clear bilaterally to auscultation and percussion. Heart: HRRR . Normal S1 and S2 without gallops or murmurs.  Abdomen: Bowel sounds are positive,  abdomen soft and non-tender  Msk:  Back normal, normal gait. Normal strength and tone for age. Extremities: No clubbing, cyanosis or edema.   Neuro: Alert and oriented X 3. Psych:  Good affect, responds appropriately  TELEMETRY:Sinus rhythm  ASSESSMENT AND PLAN: Congestive heart failure with renal failure and LV dysfunction. Patient had moderate to severe LV dysfunction and IV Lasix has improved the symptoms. Patient is being evaluated for permanent dialysis. Will do outpatient stress tests and give follow-up in the office.  Active Problems:   Acute CHF (congestive heart failure) ([place])   Pressure ulcer    Zacharee Gaddie A, MD, [facility] 10/16/2015 8:11 AM    2

## 2015-10-16 NOTE — Discharge Summary (Signed)
[facility] [place] NAME: Robert Parks    MR#:  [id number]  DATE OF BIRTH:  Nov 28, 1940  DATE OF ADMISSION:  10/11/2015 ADMITTING PHYSICIAN: Demetrios Loll, MD  DATE OF DISCHARGE: 10/16/2015  PRIMARY CARE PHYSICIAN: [facility]    ADMISSION DIAGNOSIS:  Shortness of breath [R06.02] Troponin level elevated [R79.89] Elevated brain natriuretic peptide (BNP) level [R79.9] Bilateral edema of lower extremity [R60.0]  DISCHARGE DIAGNOSIS:  Active Problems:   Acute CHF (congestive heart failure) (HCC)   Pressure ulcer   SECONDARY DIAGNOSIS:   Past Medical History:  Diagnosis Date  . Anginal pain ([place])    had angina 2-3 mths prior to visit  . Arthritis   . Chronic kidney disease    follwed by Nephrologist Dr Juanito Doom in [place], Stage 3  . Coronary artery disease   . DDD (degenerative disc disease), cervical   . Diabetes mellitus without complication (HCC)    DIET CONTROLLED   . Frequent urination   . GERD (gastroesophageal reflux disease)   . Gout   . Hyperlipidemia   . Hypertension   . Pancytopenia ([place])   . Shortness of breath dyspnea    at rest, mainly when he's active  . Stroke [facility])    ?? small stroke om 2017  came and went    HOSPITAL COURSE:  75 year old male with a history of CAD, essential hypertension, diabetes chronic kidney disease stage III who presented with dyspnea exertion and lower extreme edema.  1. Acute on chronic CHF exacerbation, patient with previous echocardiogram showing diastolic heart failure now with both diastolic( stage 2)  and systolic heart failure EF 35% : Patient diuresed well with IV Lasix. He was transitioned oral Lasix. He is diuresing well with oral Lasix. His creatinine has remained stable.  He will Continue low-dose Coreg. Consider adding ACE inhibitor or ARB once renal function is stable.  He will need outpatient stress test by cardiology.  2. Acute on chronic renal  failure stage III: Etiology is due to cardiorenal syndrome from poor perfusion due to problem #1.  Baseline creatinine is 2.4. At discharge creatinine is 2.8 Creatinine has improved with diuresis, no acute indication for dialysis at this time. Nephrology consult appreciated. Renal ultrasound showed no evidence of hydronephrosis or obstruction. He will follow-up with nephrology on September 7. Plan of care discussed with Dr. Smith Mince 3. Diabetes: Continue ADA diet and sinus [place].  4. BPH with acute urinary retention: Patient now has Foley catheter placed and will need outpatient urology follow-up due to acute urinary retention. He will continue on Flomax   5. Cervical stenosis: Patient has been evaluated by neurosurgery and plan for decompression once stabilized.  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  7. History of ASCVD: Continue Coreg, aspirin, Lipitor and Ranexa.  8. Difficulty swallowing related to cervical stenosis: Continue dysphagia 3 diet.   DISCHARGE CONDITIONS AND DIET:    Stable for discharge on heart healthy diet  CONSULTS OBTAINED:  Treatment Team:  Dionisio David, MD [place], MD  DRUG ALLERGIES:   Allergies  Allergen Reactions  . No Known Allergies Other (See Comments)    DISCHARGE MEDICATIONS:   Current Discharge Medication List    START taking these medications   Details  carvedilol (COREG) 3.125 MG tablet Take 1 tablet (3.125 mg total) by mouth 2 (two) times daily with a meal. Qty: 60 tablet, Refills: 0      CONTINUE these medications  which have CHANGED   Details  furosemide (LASIX) 80 MG tablet Take 1 tablet (80 mg total) by mouth 2 (two) times daily. Qty: 60 tablet, Refills: 0    ranolazine (RANEXA) 500 MG 12 hr tablet Take 1 tablet (500 mg total) by mouth 2 (two) times daily. Qty: 60 tablet, Refills: 0      CONTINUE these medications which have NOT CHANGED   Details  acetaminophen (TYLENOL) 650 MG CR tablet Take  650 mg by mouth every 8 (eight) hours as needed.    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    Associated Diagnoses: ITP (idiopathic thrombocytopenic purpura)    colchicine 0.6 MG tablet Take 0.3-0.6 mg by mouth See admin instructions. Take 0.3mg  once daily to prevent gout, take 1.2mg  at first sign of gout flare, may follow in 1 hour with 0.6mg , max 3 tabs   Associated Diagnoses: ITP (idiopathic thrombocytopenic purpura)    hydrALAZINE (APRESOLINE) 100 MG tablet Take 100 mg by mouth 2 (two) times daily.    isosorbide mononitrate (IMDUR) 120 MG 24 hr tablet Take 240 mg by mouth daily.    Associated Diagnoses: ITP (idiopathic thrombocytopenic purpura)    nitroGLYCERIN (NITROSTAT) 0.4 MG SL tablet Place 0.4 mg under the tongue every 5 (five) minutes as needed.    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    tamsulosin (FLOMAX) 0.4 MG CAPS capsule Take 0.4 mg by mouth daily.    Associated Diagnoses: ITP (idiopathic thrombocytopenic purpura)      STOP taking these medications     metoprolol succinate (TOPROL-XL) 100 MG 24 hr tablet      amLODipine (NORVASC) 10 MG tablet      potassium chloride (KLOR-CON) 20 MEQ packet               Today   CHIEF COMPLAINT:  Patient is doing well this morning. He ambulated yesterday physical therapy with minimal shortness of breath. His symptoms have much improved since admission.   VITAL SIGNS:  Blood pressure (!) 102/58, pulse 81, temperature 97.6 F (36.4 C), temperature source Oral, resp. rate 15, height 5\' 8"  (1.727 m), weight 67.5 kg (148 lb 12.8 oz), SpO2 96 %.   REVIEW OF SYSTEMS:  Review of Systems  Constitutional:  Negative.  Negative for chills, fever and malaise/fatigue.  HENT: Negative.  Negative for ear discharge, ear pain, hearing loss, nosebleeds and sore throat.   Eyes: Negative.  Negative for blurred vision and pain.  Respiratory: Negative.  Negative for cough, hemoptysis, shortness of breath and wheezing.   Cardiovascular: Positive for leg swelling. Negative for chest pain and palpitations.  Gastrointestinal: Negative.  Negative for abdominal pain, blood in stool, diarrhea, nausea and vomiting.  Genitourinary: Negative.  Negative for dysuria.  Musculoskeletal: Negative.  Negative for back pain.  Skin: Negative.   Neurological: Negative for dizziness, tremors, speech change, focal weakness, seizures and headaches.  Endo/Heme/Allergies: Negative.  Does not bruise/bleed easily.  Psychiatric/Behavioral: Negative.  Negative for depression, hallucinations and suicidal ideas.     PHYSICAL EXAMINATION:  GENERAL:  75 y.o.-year-old patient lying in the bed with no acute distress.  NECK:  Supple, no jugular venous distention. No thyroid enlargement, no tenderness.  LUNGS:  Normal breath sounds bilaterally, no wheezing, rales,rhonchi  No use of accessory muscles of respiration.  CARDIOVASCULAR: S1, S2 normal. No murmurs, rubs, or gallops.  ABDOMEN: Soft, non-tender, non-distended. Bowel sounds present. No organomegaly or mass.  EXTREMITIES:minimal pedal edema, NO cyanosis, or clubbing.  PSYCHIATRIC: The patient is alert and oriented x 3.  SKIN: No obvious rash, lesion, or ulcer.   DATA REVIEW:   CBC  Recent Labs Lab 10/13/15 0643  WBC 2.3*  HGB 9.4*  HCT 27.8*  PLT 56*    Chemistries   Recent Labs Lab 10/11/15 1452 10/12/15 0406  10/16/15 0436  NA 139 138  < > 139  K 5.3* 4.9  < > 3.6  CL 108 107  < > 104  CO2 22 23  < > 28  GLUCOSE 96 78  < > 99  BUN 71* 73*  < > 58*  CREATININE 4.88* 4.94*  < > 3.78*  CALCIUM 9.0 8.8*  < > 8.4*  MG  --  2.4  --   --   AST 26  --   --   --    ALT 15*  --   --   --   ALKPHOS 72  --   --   --   BILITOT 1.0  --   --   --   < > = values in this interval not displayed.  Cardiac Enzymes  Recent Labs Lab 10/11/15 1452 10/11/15 2009  TROPONINI 0.15* 0.13*    Microbiology Results  @[email or web address] @  RADIOLOGY:  No results found.    Management plans discussed with the patient and he is in agreement. Stable for discharge   Patient should follow up with pcp, cardiology and dr Smith Mince and UROLOGy  CODE STATUS:     Code Status Orders        Start     Ordered   10/11/15 1731  Full code  Continuous     10/11/15 1730    Code Status History    Date Active Date Inactive Code Status Order ID Comments User Context   10/11/2015  5:31 PM 10/12/2015  6:04 PM Full Code [id number]  Demetrios Loll, MD ED      TOTAL TIME TAKING CARE OF THIS PATIENT: 35 minutes.    Note: This dictation was prepared with Dragon dictation along with smaller phrase technology. Any transcriptional errors that result from this process are unintentional.  Corbett Moulder M.D on 10/16/2015 at 12:25 PM  Between 7am to 6pm - Pager - [phone] After 6pm go to [email or web address] - password [email or web address] [place]  Office  [phone]  CC: Primary care physician; [facility]

## 2015-10-16 NOTE — Clinical Social Work Note (Signed)
PT recommending SNF, patient and family are declining SNF at this time.  Case discussed with case manager and plan is to discharge home with home health.  MSW to sign off please re-consult if social work needs arise.  Jones Broom. Claris Pech, MSW Mon-Fri 8a-4:30p [phone]

## 2015-10-16 NOTE — Care Management Note (Signed)
Case Management Note  Patient Details  Name: Robert Parks MRN: [id number] Date of Birth: 01-Jul-1940  Subjective/Objective:   Referral to [facility] for SN, PT and HHA. Patient updated.   Action/Plan:   Expected Discharge Date:                  Expected Discharge Plan:  [place]  In-House Referral:     Discharge planning Services  CM Consult  Post Acute Care Choice:    Choice offered to:  Patient, Spouse  DME Arranged:    DME Agency:     HH Arranged:  RN, PT, Nurse's Aide [place] Agency:  [place]  Status of Service:  Completed, signed off  If discussed at [place] of Stay Meetings, dates discussed:    Additional Comments:  Jolly Mango, RN 10/16/2015, 10:20 AM

## 2015-10-31 ENCOUNTER — Ambulatory Visit: Payer: Medicare HMO | Admitting: Family

## 2015-10-31 ENCOUNTER — Encounter: Payer: Self-pay | Admitting: Family

## 2015-10-31 VITALS — BP 92/60 | HR 75 | Resp 18 | Ht 68.0 in | Wt 146.0 lb

## 2015-10-31 DIAGNOSIS — E785 Hyperlipidemia, unspecified: Secondary | ICD-10-CM | POA: Insufficient documentation

## 2015-10-31 DIAGNOSIS — I5022 Chronic systolic (congestive) heart failure: Secondary | ICD-10-CM | POA: Insufficient documentation

## 2015-10-31 DIAGNOSIS — Z8673 Personal history of transient ischemic attack (TIA), and cerebral infarction without residual deficits: Secondary | ICD-10-CM | POA: Insufficient documentation

## 2015-10-31 DIAGNOSIS — Z7982 Long term (current) use of aspirin: Secondary | ICD-10-CM

## 2015-10-31 DIAGNOSIS — M109 Gout, unspecified: Secondary | ICD-10-CM | POA: Insufficient documentation

## 2015-10-31 DIAGNOSIS — Z951 Presence of aortocoronary bypass graft: Secondary | ICD-10-CM

## 2015-10-31 DIAGNOSIS — K219 Gastro-esophageal reflux disease without esophagitis: Secondary | ICD-10-CM | POA: Insufficient documentation

## 2015-10-31 DIAGNOSIS — I2510 Atherosclerotic heart disease of native coronary artery without angina pectoris: Secondary | ICD-10-CM | POA: Insufficient documentation

## 2015-10-31 DIAGNOSIS — Z87891 Personal history of nicotine dependence: Secondary | ICD-10-CM | POA: Insufficient documentation

## 2015-10-31 DIAGNOSIS — I252 Old myocardial infarction: Secondary | ICD-10-CM

## 2015-10-31 DIAGNOSIS — Z8249 Family history of ischemic heart disease and other diseases of the circulatory system: Secondary | ICD-10-CM

## 2015-10-31 DIAGNOSIS — I950 Idiopathic hypotension: Secondary | ICD-10-CM

## 2015-10-31 DIAGNOSIS — R1310 Dysphagia, unspecified: Secondary | ICD-10-CM | POA: Insufficient documentation

## 2015-10-31 DIAGNOSIS — Z809 Family history of malignant neoplasm, unspecified: Secondary | ICD-10-CM | POA: Insufficient documentation

## 2015-10-31 DIAGNOSIS — I959 Hypotension, unspecified: Secondary | ICD-10-CM

## 2015-10-31 DIAGNOSIS — I130 Hypertensive heart and chronic kidney disease with heart failure and stage 1 through stage 4 chronic kidney disease, or unspecified chronic kidney disease: Secondary | ICD-10-CM | POA: Insufficient documentation

## 2015-10-31 DIAGNOSIS — N183 Chronic kidney disease, stage 3 (moderate): Principal | ICD-10-CM

## 2015-10-31 DIAGNOSIS — R0602 Shortness of breath: Secondary | ICD-10-CM

## 2015-10-31 DIAGNOSIS — M1990 Unspecified osteoarthritis, unspecified site: Secondary | ICD-10-CM

## 2015-10-31 DIAGNOSIS — E1122 Type 2 diabetes mellitus with diabetic chronic kidney disease: Secondary | ICD-10-CM

## 2015-10-31 DIAGNOSIS — D61818 Other pancytopenia: Secondary | ICD-10-CM

## 2015-10-31 NOTE — Patient Instructions (Addendum)
Continue weighing daily and call for an overnight weight gain of > 2 pounds or a weekly weight gain of >5 pounds.  Gastroenterology: [phone]

## 2015-10-31 NOTE — Progress Notes (Addendum)
Subjective:    Patient ID: Robert Parks, male    DOB: September 22, 1940, 75 y.o.   MRN: [id number]  Congestive Heart Failure  Presents for initial visit. The disease course has been stable. Associated symptoms include edema, fatigue, muscle weakness and shortness of breath. Pertinent negatives include no abdominal pain, chest pain, orthopnea or palpitations. The symptoms have been stable. Past treatments include beta blockers and salt and fluid restriction. The treatment provided mild relief. Compliance with prior treatments has been good. His past medical history is significant for CAD, CVA, DM and HTN. He has one 1st degree relative with heart disease.  Other  This is a recurrent (hypotension) problem. The current episode started in the past 7 days. The problem occurs intermittently. The problem has been unchanged. Associated symptoms include coughing (when drinking water), fatigue and weakness. Pertinent negatives include no abdominal pain, chest pain, congestion, headaches, neck pain or visual change. The symptoms are aggravated by standing. He has tried position changes for the symptoms. The treatment provided mild relief.    Past Medical History:  Diagnosis Date  . Anginal pain ([place])    had angina 2-3 mths prior to visit  . Arthritis   . CHF (congestive heart failure) ([place])   . Chronic kidney disease    follwed by Nephrologist Dr Juanito Doom in [place], Stage 3  . Coronary artery disease   . DDD (degenerative disc disease), cervical   . Diabetes mellitus without complication (HCC)    DIET CONTROLLED   . Frequent urination   . GERD (gastroesophageal reflux disease)   . Gout   . Hyperlipidemia   . Hypertension   . MI (myocardial infarction) ([place])   . Pancytopenia ([place])   . Shortness of breath dyspnea    at rest, mainly when he's active  . Stroke [facility])    ?? small stroke om 2017  came and went    Past Surgical History:  Procedure Laterality Date  . APPENDECTOMY    . BACK SURGERY   1980'S  . CARDIAC CATHETERIZATION     back in late 1990's [facility]  . COLONOSCOPY WITH PROPOFOL N/A 08/21/2014   Procedure: COLONOSCOPY WITH PROPOFOL;  Surgeon: Hulen Luster, MD;  Location: [facility] ENDOSCOPY;  Service: Gastroenterology;  Laterality: N/A;  . CORONARY ARTERY BYPASS GRAFT  1994  . EYE SURGERY Left    Cataract    Family History  Problem Relation Age of Onset  . Heart attack Mother   . Cancer Father     Social History  Substance Use Topics  . Smoking status: Former Smoker    Years: 30.00    Types: Cigarettes  . Smokeless tobacco: Never Used     Comment: quit in his 14's  . Alcohol use No    Allergies  Allergen Reactions  . No Known Allergies Other (See Comments)    Prior to Admission medications   Medication Sig Start Date End Date Taking? Authorizing Provider  acetaminophen (TYLENOL) 650 MG CR tablet Take 650 mg by mouth every 8 (eight) hours as needed.    Yes Historical Provider, MD  aspirin EC 81 MG tablet Take 81 mg by mouth. 08/13/12  Yes Historical Provider, MD  atorvastatin (LIPITOR) 10 MG tablet Take 10 mg by mouth daily.  10/01/15  Yes Historical Provider, MD  carvedilol (COREG) 3.125 MG tablet Take 1 tablet (3.125 mg total) by mouth 2 (two) times daily with a meal. 10/16/15  Yes Bettey Costa, MD  colchicine 0.6 MG tablet Take  0.3-0.6 mg by mouth See admin instructions. Take 0.3mg  once daily to prevent gout, take 1.2mg  at first sign of gout flare, may follow in 1 hour with 0.6mg , max 3 tabs 11/11/11  Yes Historical Provider, MD  furosemide (LASIX) 80 MG tablet Take 1 tablet (80 mg total) by mouth 2 (two) times daily. 10/16/15  Yes Bettey Costa, MD  hydrALAZINE (APRESOLINE) 100 MG tablet Take 100 mg by mouth 2 (two) times daily.   Yes Historical Provider, MD  isosorbide mononitrate (IMDUR) 120 MG 24 hr tablet Take 240 mg by mouth daily.  05/30/15  Yes Historical Provider, MD  nitroGLYCERIN (NITROSTAT) 0.4 MG SL tablet Place 0.4 mg under the tongue every 5 (five) minutes  as needed.  11/27/14  Yes Historical Provider, MD  potassium chloride SA (K-DUR,KLOR-CON) 20 MEQ tablet Take 20 mEq by mouth daily.   Yes Historical Provider, MD  ranolazine (RANEXA) 500 MG 12 hr tablet Take 1 tablet (500 mg total) by mouth 2 (two) times daily. 10/16/15  Yes Bettey Costa, MD  tamsulosin (FLOMAX) 0.4 MG CAPS capsule Take 0.4 mg by mouth daily.  11/27/14  Yes Historical Provider, MD     Review of Systems  Constitutional: Positive for appetite change and fatigue.  HENT: Positive for trouble swallowing. Negative for congestion and postnasal drip.   Eyes: Negative.   Respiratory: Positive for cough (when drinking water) and shortness of breath. Negative for chest tightness.   Cardiovascular: Positive for leg swelling. Negative for chest pain and palpitations.  Gastrointestinal: Negative for abdominal distention and abdominal pain.  Endocrine: Negative.   Genitourinary: Negative for flank pain and hematuria.       Questionable UTI per family per home health nurse  Musculoskeletal: Positive for muscle weakness. Negative for back pain and neck pain.  Skin: Negative.   Allergic/Immunologic: Negative.   Neurological: Positive for weakness. Negative for dizziness, light-headedness and headaches.  Hematological: Negative for adenopathy. Does not bruise/bleed easily.  Psychiatric/Behavioral: Positive for dysphoric mood. Negative for sleep disturbance. The patient is not nervous/anxious.        Objective:   Physical Exam  Constitutional: He is oriented to person, place, and time. He appears well-developed and well-nourished.  HENT:  Head: Normocephalic and atraumatic.  Eyes: Conjunctivae are normal. Pupils are equal, round, and reactive to light.  Neck: Normal range of motion. Neck supple.  Cardiovascular: Normal rate and regular rhythm.   Pulmonary/Chest: Effort normal. He has no wheezes. He has no rhonchi. He has rales in the left lower field.  Abdominal: Soft. He exhibits no  distension. There is no tenderness.  Musculoskeletal: He exhibits edema (1+ pitting edmea in bilateral lower legs). He exhibits no tenderness.  Cervical collar in place  Neurological: He is alert and oriented to person, place, and time.  Skin: Skin is warm and dry.  Psychiatric: He has a normal mood and affect. His behavior is normal. Thought content normal.  Nursing note and vitals reviewed.  BP (!) 83/49   Pulse 75   Resp 18   Ht 5\' 8"  (1.727 m)   Wt 146 lb (66.2 kg)   SpO2 100%   BMI 22.20 kg/m         Assessment & Plan:  1: Chronic heart failure with reduced ejection fraction- Patient presents with fatigue and shortness of breath with little exertion (Class III) but he denies any symptoms at rest. He does have swelling in both of his lower legs and does try to elevate them during the  day. He came into the office in a wheelchair because he says that he's feeling weak and doesn't think he could have walked into the office. Has been having home health nurse visit on a weekly basis and is supposed to be starting physical therapy. Does have few rales in his LLL that clear somewhat when he coughs. Is already weighing himself daily and says that his weight has been stable. Discussed the importance of calling for an overnight weight gain of >2 pounds or a weekly weight gain of >5 pounds. He is not adding salt to his food and his family has been trying to read food labels. Discussed the importance of following a 2000mg  sodium diet and written information was given to them about this.  2: Hypotension- Patient's blood pressure is low and his wife says that it's been running intermittently low at home at times. Does report feeling weak but denies any dizziness or lightheadedness. Discussed stopping his hydralazine as I  don't want to stop his diuretic due to edema already present in his lower legs. Wife is to continue checking his blood pressure and call if it remains <90. 3: Diabetes with CKD-  Glucose this morning was 138. He is supposed to see the nephrologist tomorrow (11/03/2015) and follows also with his PCP regarding this. 4: Dysphagia- Patient mentions that he doesn't have much of an appetite and that he tends to cough quite a bit after drinking water. He has also gotten choked on food at times. Discussed following up with GI regarding this as he could possibly be aspirating. They will follow up with his PCP so that he can get a referral.  Medication bottles were reviewed. Family mentions that the home health nurse thought his urine smelled strong and said that he might have a UTI and they were wondering how to get that treated. Advised them to call his PCP if the home health nurse hadn't already done so to see if they would treat based on home health nurses recommendation. Patient has a foley catheter currently present.   Return here in 1 month or sooner for any questions/problems before then.

## 2015-11-01 ENCOUNTER — Inpatient Hospital Stay
Admission: EM | Admit: 2015-11-01 | Discharge: 2015-11-25 | DRG: 698 | Disposition: E | Payer: Medicare HMO | Attending: Internal Medicine | Admitting: Internal Medicine

## 2015-11-01 ENCOUNTER — Encounter: Payer: Self-pay | Admitting: Emergency Medicine

## 2015-11-01 DIAGNOSIS — Z66 Do not resuscitate: Secondary | ICD-10-CM | POA: Diagnosis not present

## 2015-11-01 DIAGNOSIS — M4802 Spinal stenosis, cervical region: Secondary | ICD-10-CM | POA: Diagnosis not present

## 2015-11-01 DIAGNOSIS — R0600 Dyspnea, unspecified: Secondary | ICD-10-CM

## 2015-11-01 DIAGNOSIS — L89159 Pressure ulcer of sacral region, unspecified stage: Secondary | ICD-10-CM | POA: Diagnosis present

## 2015-11-01 DIAGNOSIS — Z978 Presence of other specified devices: Secondary | ICD-10-CM

## 2015-11-01 DIAGNOSIS — E874 Mixed disorder of acid-base balance: Secondary | ICD-10-CM | POA: Diagnosis present

## 2015-11-01 DIAGNOSIS — E11649 Type 2 diabetes mellitus with hypoglycemia without coma: Secondary | ICD-10-CM | POA: Diagnosis present

## 2015-11-01 DIAGNOSIS — R338 Other retention of urine: Secondary | ICD-10-CM | POA: Diagnosis present

## 2015-11-01 DIAGNOSIS — Y731 Therapeutic (nonsurgical) and rehabilitative gastroenterology and urology devices associated with adverse incidents: Secondary | ICD-10-CM | POA: Diagnosis present

## 2015-11-01 DIAGNOSIS — T83511A Infection and inflammatory reaction due to indwelling urethral catheter, initial encounter: Principal | ICD-10-CM | POA: Diagnosis present

## 2015-11-01 DIAGNOSIS — K219 Gastro-esophageal reflux disease without esophagitis: Secondary | ICD-10-CM | POA: Diagnosis present

## 2015-11-01 DIAGNOSIS — J9600 Acute respiratory failure, unspecified whether with hypoxia or hypercapnia: Secondary | ICD-10-CM

## 2015-11-01 DIAGNOSIS — K567 Ileus, unspecified: Secondary | ICD-10-CM | POA: Diagnosis not present

## 2015-11-01 DIAGNOSIS — I5022 Chronic systolic (congestive) heart failure: Secondary | ICD-10-CM | POA: Insufficient documentation

## 2015-11-01 DIAGNOSIS — N189 Chronic kidney disease, unspecified: Secondary | ICD-10-CM

## 2015-11-01 DIAGNOSIS — R140 Abdominal distension (gaseous): Secondary | ICD-10-CM

## 2015-11-01 DIAGNOSIS — D631 Anemia in chronic kidney disease: Secondary | ICD-10-CM | POA: Diagnosis present

## 2015-11-01 DIAGNOSIS — B961 Klebsiella pneumoniae [K. pneumoniae] as the cause of diseases classified elsewhere: Secondary | ICD-10-CM | POA: Diagnosis present

## 2015-11-01 DIAGNOSIS — Z8673 Personal history of transient ischemic attack (TIA), and cerebral infarction without residual deficits: Secondary | ICD-10-CM

## 2015-11-01 DIAGNOSIS — I471 Supraventricular tachycardia: Secondary | ICD-10-CM | POA: Diagnosis present

## 2015-11-01 DIAGNOSIS — N184 Chronic kidney disease, stage 4 (severe): Secondary | ICD-10-CM | POA: Diagnosis present

## 2015-11-01 DIAGNOSIS — I255 Ischemic cardiomyopathy: Secondary | ICD-10-CM | POA: Diagnosis present

## 2015-11-01 DIAGNOSIS — N179 Acute kidney failure, unspecified: Secondary | ICD-10-CM | POA: Diagnosis present

## 2015-11-01 DIAGNOSIS — G931 Anoxic brain damage, not elsewhere classified: Secondary | ICD-10-CM | POA: Diagnosis not present

## 2015-11-01 DIAGNOSIS — R109 Unspecified abdominal pain: Secondary | ICD-10-CM

## 2015-11-01 DIAGNOSIS — D72819 Decreased white blood cell count, unspecified: Secondary | ICD-10-CM | POA: Diagnosis present

## 2015-11-01 DIAGNOSIS — Y92009 Unspecified place in unspecified non-institutional (private) residence as the place of occurrence of the external cause: Secondary | ICD-10-CM | POA: Diagnosis not present

## 2015-11-01 DIAGNOSIS — I469 Cardiac arrest, cause unspecified: Secondary | ICD-10-CM | POA: Diagnosis not present

## 2015-11-01 DIAGNOSIS — M542 Cervicalgia: Secondary | ICD-10-CM

## 2015-11-01 DIAGNOSIS — J9811 Atelectasis: Secondary | ICD-10-CM | POA: Diagnosis present

## 2015-11-01 DIAGNOSIS — Z7982 Long term (current) use of aspirin: Secondary | ICD-10-CM

## 2015-11-01 DIAGNOSIS — Z79899 Other long term (current) drug therapy: Secondary | ICD-10-CM

## 2015-11-01 DIAGNOSIS — E876 Hypokalemia: Secondary | ICD-10-CM | POA: Diagnosis present

## 2015-11-01 DIAGNOSIS — I959 Hypotension, unspecified: Secondary | ICD-10-CM | POA: Insufficient documentation

## 2015-11-01 DIAGNOSIS — N390 Urinary tract infection, site not specified: Secondary | ICD-10-CM | POA: Diagnosis present

## 2015-11-01 DIAGNOSIS — Z951 Presence of aortocoronary bypass graft: Secondary | ICD-10-CM

## 2015-11-01 DIAGNOSIS — Z4659 Encounter for fitting and adjustment of other gastrointestinal appliance and device: Secondary | ICD-10-CM

## 2015-11-01 DIAGNOSIS — R1310 Dysphagia, unspecified: Secondary | ICD-10-CM | POA: Diagnosis present

## 2015-11-01 DIAGNOSIS — Z515 Encounter for palliative care: Secondary | ICD-10-CM | POA: Diagnosis not present

## 2015-11-01 DIAGNOSIS — Z8249 Family history of ischemic heart disease and other diseases of the circulatory system: Secondary | ICD-10-CM

## 2015-11-01 DIAGNOSIS — I130 Hypertensive heart and chronic kidney disease with heart failure and stage 1 through stage 4 chronic kidney disease, or unspecified chronic kidney disease: Secondary | ICD-10-CM | POA: Diagnosis present

## 2015-11-01 DIAGNOSIS — M109 Gout, unspecified: Secondary | ICD-10-CM | POA: Diagnosis present

## 2015-11-01 DIAGNOSIS — J90 Pleural effusion, not elsewhere classified: Secondary | ICD-10-CM

## 2015-11-01 DIAGNOSIS — I2510 Atherosclerotic heart disease of native coronary artery without angina pectoris: Secondary | ICD-10-CM | POA: Diagnosis present

## 2015-11-01 DIAGNOSIS — R609 Edema, unspecified: Secondary | ICD-10-CM

## 2015-11-01 DIAGNOSIS — E785 Hyperlipidemia, unspecified: Secondary | ICD-10-CM | POA: Diagnosis present

## 2015-11-01 DIAGNOSIS — J9601 Acute respiratory failure with hypoxia: Secondary | ICD-10-CM | POA: Diagnosis not present

## 2015-11-01 DIAGNOSIS — J9621 Acute and chronic respiratory failure with hypoxia: Secondary | ICD-10-CM | POA: Diagnosis not present

## 2015-11-01 DIAGNOSIS — Z87891 Personal history of nicotine dependence: Secondary | ICD-10-CM

## 2015-11-01 DIAGNOSIS — R6520 Severe sepsis without septic shock: Secondary | ICD-10-CM | POA: Diagnosis present

## 2015-11-01 DIAGNOSIS — M5030 Other cervical disc degeneration, unspecified cervical region: Secondary | ICD-10-CM | POA: Diagnosis present

## 2015-11-01 DIAGNOSIS — R34 Anuria and oliguria: Secondary | ICD-10-CM | POA: Diagnosis not present

## 2015-11-01 DIAGNOSIS — A419 Sepsis, unspecified organism: Secondary | ICD-10-CM | POA: Diagnosis present

## 2015-11-01 DIAGNOSIS — R680 Hypothermia, not associated with low environmental temperature: Secondary | ICD-10-CM | POA: Diagnosis present

## 2015-11-01 DIAGNOSIS — Z789 Other specified health status: Secondary | ICD-10-CM | POA: Diagnosis not present

## 2015-11-01 DIAGNOSIS — I462 Cardiac arrest due to underlying cardiac condition: Secondary | ICD-10-CM | POA: Diagnosis not present

## 2015-11-01 DIAGNOSIS — I493 Ventricular premature depolarization: Secondary | ICD-10-CM | POA: Diagnosis not present

## 2015-11-01 DIAGNOSIS — E1122 Type 2 diabetes mellitus with diabetic chronic kidney disease: Secondary | ICD-10-CM | POA: Diagnosis present

## 2015-11-01 DIAGNOSIS — I214 Non-ST elevation (NSTEMI) myocardial infarction: Secondary | ICD-10-CM | POA: Diagnosis not present

## 2015-11-01 DIAGNOSIS — I252 Old myocardial infarction: Secondary | ICD-10-CM

## 2015-11-01 DIAGNOSIS — N170 Acute kidney failure with tubular necrosis: Secondary | ICD-10-CM | POA: Diagnosis not present

## 2015-11-01 DIAGNOSIS — B9620 Unspecified Escherichia coli [E. coli] as the cause of diseases classified elsewhere: Secondary | ICD-10-CM | POA: Diagnosis present

## 2015-11-01 HISTORY — DX: Heart failure, unspecified: I50.9

## 2015-11-01 HISTORY — DX: Acute myocardial infarction, unspecified: I21.9

## 2015-11-01 HISTORY — DX: Orthopnea: R06.01

## 2015-11-01 HISTORY — DX: Dysphagia, unspecified: R13.10

## 2015-11-01 LAB — BASIC METABOLIC PANEL
Anion gap: 14 (ref 5–15)
BUN: 94 mg/dL — AB (ref 6–20)
CHLORIDE: 100 mmol/L — AB (ref 101–111)
CO2: 21 mmol/L — ABNORMAL LOW (ref 22–32)
CREATININE: 4.42 mg/dL — AB (ref 0.61–1.24)
Calcium: 8.9 mg/dL (ref 8.9–10.3)
GFR calc Af Amer: 14 mL/min — ABNORMAL LOW (ref >60)
GFR, EST NON AFRICAN AMERICAN: 12 mL/min — AB (ref >60)
Glucose, Bld: 111 mg/dL — ABNORMAL HIGH (ref 65–99)
Potassium: 4.7 mmol/L (ref 3.5–5.1)
SODIUM: 135 mmol/L (ref 135–145)

## 2015-11-01 LAB — URINALYSIS COMPLETE WITH MICROSCOPIC (ARMC ONLY)
BILIRUBIN URINE: NEGATIVE
Bacteria, UA: NONE SEEN
Bilirubin Urine: NEGATIVE
GLUCOSE, UA: NEGATIVE mg/dL
GLUCOSE, UA: NEGATIVE mg/dL
Ketones, ur: NEGATIVE mg/dL
Ketones, ur: NEGATIVE mg/dL
NITRITE: NEGATIVE
Nitrite: NEGATIVE
PH: 5 (ref 5.0–8.0)
Protein, ur: 100 mg/dL — AB
Protein, ur: 100 mg/dL — AB
SPECIFIC GRAVITY, URINE: 1.014 (ref 1.005–1.030)
Specific Gravity, Urine: 1.012 (ref 1.005–1.030)
Squamous Epithelial / LPF: NONE SEEN
pH: 7 (ref 5.0–8.0)

## 2015-11-01 LAB — TROPONIN I
TROPONIN I: 0.14 ng/mL — AB (ref <0.03)
TROPONIN I: 0.13 ng/mL — AB (ref <0.03)
Troponin I: 0.16 ng/mL (ref <0.03)

## 2015-11-01 LAB — CBC
HCT: 25.5 % — ABNORMAL LOW (ref 40.0–52.0)
Hemoglobin: 8.6 g/dL — ABNORMAL LOW (ref 13.0–18.0)
MCH: 31.7 pg (ref 26.0–34.0)
MCHC: 33.7 g/dL (ref 32.0–36.0)
MCV: 93.9 fL (ref 80.0–100.0)
PLATELETS: 87 10*3/uL — AB (ref 150–440)
RBC: 2.71 MIL/uL — ABNORMAL LOW (ref 4.40–5.90)
RDW: 14.8 % — AB (ref 11.5–14.5)
WBC: 3.7 10*3/uL — AB (ref 3.8–10.6)

## 2015-11-01 LAB — LACTIC ACID, PLASMA
LACTIC ACID, VENOUS: 2.4 mmol/L — AB (ref 0.5–1.9)
Lactic Acid, Venous: 1.7 mmol/L (ref 0.5–1.9)

## 2015-11-01 MED ORDER — ASPIRIN EC 81 MG PO TBEC
81.0000 mg | DELAYED_RELEASE_TABLET | Freq: Every day | ORAL | Status: DC
  Administered 2015-11-01 – 2015-11-02 (×2): 81 mg via ORAL
  Filled 2015-11-01 (×2): qty 1

## 2015-11-01 MED ORDER — ATORVASTATIN CALCIUM 10 MG PO TABS
10.0000 mg | ORAL_TABLET | Freq: Every day | ORAL | Status: DC
  Administered 2015-11-01 – 2015-11-04 (×4): 10 mg via ORAL
  Filled 2015-11-01 (×4): qty 1

## 2015-11-01 MED ORDER — SODIUM CHLORIDE 0.9 % IV BOLUS (SEPSIS)
1000.0000 mL | Freq: Once | INTRAVENOUS | Status: AC
  Administered 2015-11-01: 1000 mL via INTRAVENOUS

## 2015-11-01 MED ORDER — DEXTROSE 5 % IV SOLN
1.0000 g | Freq: Once | INTRAVENOUS | Status: AC
  Administered 2015-11-01: 1 g via INTRAVENOUS
  Filled 2015-11-01: qty 10

## 2015-11-01 MED ORDER — COLCHICINE 0.6 MG PO TABS
0.3000 mg | ORAL_TABLET | Freq: Every day | ORAL | Status: DC
  Administered 2015-11-01 – 2015-11-02 (×2): 0.3 mg via ORAL
  Filled 2015-11-01 (×2): qty 1

## 2015-11-01 MED ORDER — ISOSORBIDE MONONITRATE ER 60 MG PO TB24
240.0000 mg | ORAL_TABLET | Freq: Every day | ORAL | Status: DC
  Administered 2015-11-01 – 2015-11-04 (×4): 240 mg via ORAL
  Filled 2015-11-01 (×5): qty 4

## 2015-11-01 MED ORDER — SODIUM CHLORIDE 0.9 % IV BOLUS (SEPSIS)
500.0000 mL | Freq: Once | INTRAVENOUS | Status: AC
  Administered 2015-11-01: 500 mL via INTRAVENOUS

## 2015-11-01 MED ORDER — SODIUM CHLORIDE 0.9% FLUSH
3.0000 mL | Freq: Two times a day (BID) | INTRAVENOUS | Status: DC
Start: 2015-11-01 — End: 2015-11-07
  Administered 2015-11-01 – 2015-11-06 (×10): 3 mL via INTRAVENOUS

## 2015-11-01 MED ORDER — SODIUM CHLORIDE 0.9 % IV SOLN
INTRAVENOUS | Status: AC
  Administered 2015-11-01 (×2): via INTRAVENOUS

## 2015-11-01 MED ORDER — DEXTROSE 5 % IV SOLN
1.0000 g | INTRAVENOUS | Status: DC
  Filled 2015-11-01: qty 10

## 2015-11-01 MED ORDER — HEPARIN SODIUM (PORCINE) 5000 UNIT/ML IJ SOLN
5000.0000 [IU] | Freq: Three times a day (TID) | INTRAMUSCULAR | Status: DC
  Administered 2015-11-01 – 2015-11-02 (×3): 5000 [IU] via SUBCUTANEOUS
  Filled 2015-11-01 (×4): qty 1

## 2015-11-01 MED ORDER — TAMSULOSIN HCL 0.4 MG PO CAPS
0.4000 mg | ORAL_CAPSULE | Freq: Every day | ORAL | Status: DC
  Administered 2015-11-01 – 2015-11-04 (×4): 0.4 mg via ORAL
  Filled 2015-11-01 (×4): qty 1

## 2015-11-01 NOTE — ED Notes (Signed)
Helped put foley in .

## 2015-11-01 NOTE — ED Triage Notes (Signed)
Patient presents to the ED via EMS from Home.  Patient reports history of spinal stenosis, CABG, MI, and CHF.  Patient reports increased weakness over the past week with noticeable difficulty walking.  Patient has a urinary catheter and states, "I was supposed to see my kidney doctor today, but I'm here instead."

## 2015-11-01 NOTE — Care Management (Signed)
Patient is readmission from home.  Previous discharge 8.22.2017 after stay for heart failure.  A referral was made to the heart failure clinic.   He is currently open to [place].  Has indwelling foley. Being admitted UTI  with hypotension and lactic acidosis.  His BUN is increased to 94 from a value of 58 on 8.22.17. Systolic blood pressure in the ED in the 80s.  Notified [facility] of admission

## 2015-11-01 NOTE — ED Notes (Signed)
Lab notified to collect second set of cultures due to pt being a difficult stick.

## 2015-11-01 NOTE — ED Provider Notes (Signed)
[facility] Emergency Department Provider Note ____________________________________________   I have reviewed the triage vital signs and the triage nursing note.  HISTORY  Chief Complaint Extremity Weakness   Historian Patient and wife and sister  HPI Robert Parks is a 75 y.o. male with a history of prior stroke, cardiac disease, chronic kidney disease, and CHF, presents today due to generalized weakness and feeling like he couldn't get up and walk today. No one-sided weakness. No chest pain. No shortness of breath.  Patient states he takes blood pressure medication and that sometimes his blood pressure runs low.  A nurse came out with house yesterday and told him that his urine smelled like it might have a urinary tract infection and a culture was sent. He does have an indwelling urinary catheter due to urinary retention for several weeks now.denies pain.   Past Medical History:  Diagnosis Date  . Anginal pain ([place])    had angina 2-3 mths prior to visit  . Arthritis   . CHF (congestive heart failure) ([place])   . Chronic kidney disease    follwed by Nephrologist Dr Juanito Doom in [place], Stage 3  . Coronary artery disease   . DDD (degenerative disc disease), cervical   . Diabetes mellitus without complication (HCC)    DIET CONTROLLED   . Frequent urination   . GERD (gastroesophageal reflux disease)   . Gout   . Hyperlipidemia   . Hypertension   . MI (myocardial infarction) ([place])   . Pancytopenia ([place])   . Shortness of breath dyspnea    at rest, mainly when he's active  . Stroke [facility])    ?? small stroke om 2017  came and went    Patient Active Problem List   Diagnosis Date Noted  . Pressure ulcer 10/13/2015  . Acute CHF (congestive heart failure) ([place]) 10/11/2015  . ITP (idiopathic thrombocytopenic purpura) 08/06/2015  . Chronic diastolic heart failure ([place]) 07/18/2015  . Spinal stenosis of cervical region 07/18/2015  . Left-sided weakness  05/16/2015  . Neck pain 05/16/2015  . CKD (chronic kidney disease) stage 3, GFR 30-59 ml/min 08/25/2013  . Benign localized prostatic hyperplasia with lower urinary tract symptoms (LUTS) 11/05/2011  . Chronic prostatitis 11/05/2011  . Elevated prostate specific antigen (PSA) 11/05/2011  . ED (erectile dysfunction) of organic origin 11/05/2011  . Intermediate coronary syndrome ([place]) 05/21/2011  . Chronic kidney disease, stage III (moderate) 04/20/2011  . Coronary artery disease 04/20/2011  . Gout 04/20/2011  . Hypertension, benign 04/20/2011  . Type II diabetes mellitus ([place]) 04/20/2011    Past Surgical History:  Procedure Laterality Date  . APPENDECTOMY    . BACK SURGERY  1980'S  . CARDIAC CATHETERIZATION     back in late 1990's [facility]  . COLONOSCOPY WITH PROPOFOL N/A 08/21/2014   Procedure: COLONOSCOPY WITH PROPOFOL;  Surgeon: Hulen Luster, MD;  [facility] ENDOSCOPY;  Service: Gastroenterology;  Laterality: N/A;  . CORONARY ARTERY BYPASS GRAFT  1994  . EYE SURGERY Left    Cataract    Prior to Admission medications   Medication Sig Start Date End Date Taking? Authorizing Provider  acetaminophen (TYLENOL) 650 MG CR tablet Take 650 mg by mouth every 8 (eight) hours as needed.    Yes Historical Provider, MD  aspirin EC 81 MG tablet Take 81 mg by mouth. 08/13/12  Yes Historical Provider, MD  atorvastatin (LIPITOR) 10 MG tablet Take 10 mg by mouth daily.  10/01/15  Yes Historical Provider, MD  carvedilol (  COREG) 3.125 MG tablet Take 1 tablet (3.125 mg total) by mouth 2 (two) times daily with a meal. 10/16/15  Yes Sital Mody, MD  colchicine 0.6 MG tablet Take 0.3-0.6 mg by mouth See admin instructions. Take 0.3mg  once daily to prevent gout, take 1.2mg  at first sign of gout flare, may follow in 1 hour with 0.6mg , max 3 tabs 11/11/11  Yes Historical Provider, MD  furosemide (LASIX) 80 MG tablet Take 1 tablet (80 mg total) by mouth 2 (two) times daily. 10/16/15  Yes Bettey Costa, MD   hydrALAZINE (APRESOLINE) 100 MG tablet Take 100 mg by mouth 2 (two) times daily.   Yes Historical Provider, MD  isosorbide mononitrate (IMDUR) 120 MG 24 hr tablet Take 240 mg by mouth daily.  05/30/15  Yes Historical Provider, MD  nitroGLYCERIN (NITROSTAT) 0.4 MG SL tablet Place 0.4 mg under the tongue every 5 (five) minutes as needed.  11/27/14  Yes Historical Provider, MD  potassium chloride SA (K-DUR,KLOR-CON) 20 MEQ tablet Take 20 mEq by mouth daily.   Yes Historical Provider, MD  ranolazine (RANEXA) 500 MG 12 hr tablet Take 1 tablet (500 mg total) by mouth 2 (two) times daily. 10/16/15  Yes Bettey Costa, MD  tamsulosin (FLOMAX) 0.4 MG CAPS capsule Take 0.4 mg by mouth daily.  11/27/14  Yes Historical Provider, MD    Allergies  Allergen Reactions  . No Known Allergies Other (See Comments)    Family History  Problem Relation Age of Onset  . Heart attack Mother   . Cancer Father     Social History Social History  Substance Use Topics  . Smoking status: Former Smoker    Years: 30.00    Types: Cigarettes  . Smokeless tobacco: Never Used     Comment: quit in his 63's  . Alcohol use No    Review of Systems  Constitutional: Negative for fever. Eyes: Negative for visual changes. ENT: Negative for sore throat. Cardiovascular: Negative for chest pain. Respiratory: Negative for shortness of breath. Gastrointestinal: Negative for abdominal pain, vomiting and diarrhea. Genitourinary: Negative for dysuria. Musculoskeletal: Negative for back pain. Skin: Negative for rash. Neurological: Negative for headache. 10 point Review of Systems otherwise negative ____________________________________________   PHYSICAL EXAM:  VITAL SIGNS: ED Triage Vitals  Enc Vitals Group     BP 11/02/2015 1000 91/61     Pulse Rate 11/20/2015 1000 70     Resp 11/15/2015 1000 16     Temp 10/31/2015 1002 98.8 F (37.1 C)     Temp Source 11/15/2015 1002 Oral     SpO2 11/22/2015 1000 100 %     Weight 10/27/2015 1002  141 lb (64 kg)     Height 11/23/2015 1002 5\' 8"  (1.727 m)     Head Circumference --      Peak Flow --      Pain Score 11/21/2015 1003 0     Pain Loc --      Pain Edu? --      Excl. in [place]? --      Constitutional: Alert and oriented. Weak voice while telling me the history, but no acute distress. HEENT   Head: Normocephalic and atraumatic.      Eyes: Conjunctivae are normal. PERRL. Normal extraocular movements.      Ears:         Nose: No congestion/rhinnorhea.   Mouth/Throat: Mucous membranes are mildly dry.   Neck: No stridor. Cardiovascular/Chest: Normal rate, regular rhythm.  No murmurs, rubs, or gallops. Respiratory:  Normal respiratory effort without tachypnea nor retractions. Breath sounds are clear and equal bilaterally. No wheezes/rales/rhonchi. Gastrointestinal: Soft. No distention, no guarding, no rebound. Nontender.    Genitourinary/rectal:Deferred Musculoskeletal: Nontender with normal range of motion in all extremities. No joint effusions.  No lower extremity tenderness.  No edema. Neurologic:  Normal speech and language. No gross or focal neurologic deficits are appreciated. Skin:  Skin is warm, dry and intact. No rash noted. Psychiatric: Mood and affect are normal. Speech and behavior are normal. Patient exhibits appropriate insight and judgment.   ____________________________________________  LABS (pertinent positives/negatives)  Labs Reviewed  BASIC METABOLIC PANEL - Abnormal; Notable for the following:       Result Value   Chloride 100 (*)    CO2 21 (*)    Glucose, Bld 111 (*)    BUN 94 (*)    Creatinine, Ser 4.42 (*)    GFR calc non Af Amer 12 (*)    GFR calc Af Amer 14 (*)    All other components within normal limits  CBC - Abnormal; Notable for the following:    WBC 3.7 (*)    RBC 2.71 (*)    Hemoglobin 8.6 (*)    HCT 25.5 (*)    RDW 14.8 (*)    Platelets 87 (*)    All other components within normal limits  URINALYSIS COMPLETEWITH  MICROSCOPIC ([facility] ONLY) - Abnormal; Notable for the following:    Color, Urine AMBER (*)    APPearance CLOUDY (*)    Hgb urine dipstick 1+ (*)    Protein, ur 100 (*)    Leukocytes, UA 3+ (*)    Squamous Epithelial / LPF 0-5 (*)    All other components within normal limits  LACTIC ACID, PLASMA - Abnormal; Notable for the following:    Lactic Acid, Venous 2.4 (*)    All other components within normal limits  CULTURE, BLOOD (ROUTINE X 2)  CULTURE, BLOOD (ROUTINE X 2)  URINE CULTURE  LACTIC ACID, PLASMA  URINALYSIS COMPLETEWITH MICROSCOPIC ([facility] ONLY)  CBG MONITORING, ED    ____________________________________________    EKG I, Lisa Roca, MD, the attending physician have personally viewed and interpreted all ECGs.  68 bpm normal sinus rhythm. Nonspecific intraventricular conduction delay. Occasional PVCs. ST depression with T-wave inversions inferior and laterally which are similar to prior EKG ____________________________________________  RADIOLOGY All Xrays were viewed by me. Imaging interpreted by Radiologist.  None __________________________________________  PROCEDURES  Procedure(s) performed: None  Critical Care performed: CRITICAL CARE Performed by: Lisa Roca   Total critical care time: 30 minutes  Critical care time was exclusive of separately billable procedures and treating other patients.  Critical care was necessary to treat or prevent imminent or life-threatening deterioration.  Critical care was time spent personally by me on the following activities: development of treatment plan with patient and/or surrogate as well as nursing, discussions with consultants, evaluation of patient's response to treatment, examination of patient, obtaining history from patient or surrogate, ordering and performing treatments and interventions, ordering and review of laboratory studies, ordering and review of radiographic studies, pulse oximetry and re-evaluation of  patient's condition.   ____________________________________________   ED COURSE / ASSESSMENT AND PLAN  Pertinent labs & imaging results that were available during my care of the patient were reviewed by me and considered in my medical decision making (see chart for details).   Mr. Rhynes is here for generalized weakness, but on arrival was found to have hypotension in the 80s. No  tachycardia or fever. He does report he was told he might have a urinary tract infection by home health nurse yesterday.  Patient has a history of CHF, initially I felt his hypotension may be related to dehydration versus hypertension medication side effect rather than sepsis.  Initial urinalysis result with out bacteria, too numerous to count white blood cells and leukocytes, and concern with hypotension and elevated lactate, for the possibility for sepsis and initiated the sepsis order set. Patient was given Rocephin for presumed source as urine infection.  Ultimately, patient will be hydrated for acute on chronic renal failure, and blood pressures monitored, blood cultures have been sent.   CONSULTATIONS:   Hospitalist for admission   Patient / Family / Caregiver informed of clinical course, medical decision-making process, and agree with plan.   ___________________________________________   FINAL CLINICAL IMPRESSION(S) / ED DIAGNOSES   Final diagnoses:  UTI (lower urinary tract infection)  Hypotension, unspecified  Sepsis, due to unspecified organism [facility])              Note: This dictation was prepared with Dragon dictation. Any transcriptional errors that result from this process are unintentional    Lisa Roca, MD 11/21/2015 1228

## 2015-11-01 NOTE — ED Notes (Signed)
Dr. Reita Cliche in room to assess patient at this time.

## 2015-11-01 NOTE — ED Notes (Signed)
Patient reports rash to bottom.

## 2015-11-01 NOTE — Progress Notes (Signed)
Patient admitted from ED. Came to hospital for extreme weakness and inability to walk.  Determined to be dehydrated and received 2.5 liters of ns.  Suspected to have UTI.  Foley replaced in ED and UA and culture sent. Patient has foley for last couple of weeks 2nd to retention  2nd to BPH.  Patient has a cervical collar on for cervical stenosis. Family says it will be repaired when he is stable and his platelet count is normal.  O2 sats WNL on room air. Although patients has to remain in an upright position to breath comfortably.  Family was concerned when we need to lay him flat to pull him up in bed.  Laid him flat for very short time.  He did fine. Telemetry in SR. Pedial pulses only detected with doppler.  Stage II sacral wound. 2.5 cm x 1.5.  Family states he had during his last admission and isn't healing.  They are treating it with neosporin.  They have not been turning him to get the pressure off his sacrum.  Dietary consult placed as patient has difficulty swallowing and frequently can't keep it down.  Passed a basic swallow study with a cracker and water.  No coughing or other indication of aspiration.  Speech consult has been ordered.

## 2015-11-01 NOTE — H&P (Signed)
[place] [facility] [place] NAME: Robert Parks    MR#:  [id number]  DATE OF BIRTH:  01/24/1941  DATE OF ADMISSION:  10/26/2015  PRIMARY CARE PHYSICIAN: [place]   REQUESTING/REFERRING PHYSICIAN: [place]  CHIEF COMPLAINT:   Chief Complaint  Patient presents with  . Extremity Weakness    HISTORY OF PRESENT ILLNESS: Robert Parks  is a 75 y.o. 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 He is scheduled to urology clinic on 13th September. Since he went home he is feeling generalized weakness and not eating very well also. He is able to walk with a cane. Today morning he could not get up or walk felt generalized weakness so family decided to bring him to the hospital. In ER he was noted to be running hypotension and his lactic acid was high with the many WBCs in his urine. Foley catheter was changed and he was given to hospitalist team for further management.  PAST MEDICAL HISTORY:   Past Medical History:  Diagnosis Date  . Anginal pain ([place])    had angina 2-3 mths prior to visit  . Arthritis   . CHF (congestive heart failure) ([place])   . Chronic kidney disease    follwed by Nephrologist Dr Juanito Doom in [place], Stage 3  . Coronary artery disease   . DDD (degenerative disc disease), cervical   . Diabetes mellitus without complication (HCC)    DIET CONTROLLED   . Frequent urination   . GERD (gastroesophageal reflux disease)   . Gout   . Hyperlipidemia   . Hypertension   . MI (myocardial infarction) ([place])   . Pancytopenia ([place])   . Shortness of  breath dyspnea    at rest, mainly when he's active  . Stroke [facility])    ?? small stroke om 2017  came and went    PAST SURGICAL HISTORY: Past Surgical History:  Procedure Laterality Date  . APPENDECTOMY    . BACK SURGERY  1980'S  . CARDIAC CATHETERIZATION     back in late 1990's [facility]  . COLONOSCOPY WITH PROPOFOL N/A 08/21/2014   Procedure: COLONOSCOPY WITH PROPOFOL;  Surgeon: Hulen Luster, MD;  Location: [facility] ENDOSCOPY;  Service: Gastroenterology;  Laterality: N/A;  . CORONARY ARTERY BYPASS GRAFT  1994  . EYE SURGERY Left    Cataract    SOCIAL HISTORY:  Social History  Substance Use Topics  . Smoking status: Former Smoker    Years: 30.00    Types: Cigarettes  . Smokeless tobacco: Never Used     Comment: quit in his 64's  . Alcohol use No    FAMILY HISTORY:  Family History  Problem Relation Age of Onset  . Heart attack Mother   . Cancer Father     DRUG ALLERGIES:  Allergies  Allergen Reactions  . No Known Allergies Other (See Comments)    REVIEW OF SYSTEMS:   CONSTITUTIONAL: No fever, fatigue or weakness.  EYES: No blurred or double vision.  EARS, NOSE, AND THROAT: No tinnitus or ear pain.  RESPIRATORY: No cough, shortness of breath, wheezing or hemoptysis.  CARDIOVASCULAR: No chest pain, orthopnea,  edema.  GASTROINTESTINAL: No nausea, vomiting, diarrhea or abdominal pain.  GENITOURINARY: No dysuria, hematuria.  ENDOCRINE: No polyuria, nocturia,  HEMATOLOGY: No anemia, easy bruising or bleeding SKIN: No rash or lesion. MUSCULOSKELETAL: No joint pain or arthritis.   NEUROLOGIC: No tingling, numbness, weakness.  PSYCHIATRY: No anxiety or depression.   MEDICATIONS AT HOME:  Prior to Admission medications   Medication Sig Start Date End Date Taking? Authorizing Provider  acetaminophen (TYLENOL) 650 MG CR tablet Take 650 mg by mouth every 8 (eight) hours as needed.    Yes Historical Provider, MD  aspirin EC 81 MG tablet Take 81 mg by mouth. 08/13/12  Yes  Historical Provider, MD  atorvastatin (LIPITOR) 10 MG tablet Take 10 mg by mouth daily.  10/01/15  Yes Historical Provider, MD  carvedilol (COREG) 3.125 MG tablet Take 1 tablet (3.125 mg total) by mouth 2 (two) times daily with a meal. 10/16/15  Yes Sital Mody, MD  colchicine 0.6 MG tablet Take 0.3-0.6 mg by mouth See admin instructions. Take 0.3mg  once daily to prevent gout, take 1.2mg  at first sign of gout flare, may follow in 1 hour with 0.6mg , max 3 tabs 11/11/11  Yes Historical Provider, MD  furosemide (LASIX) 80 MG tablet Take 1 tablet (80 mg total) by mouth 2 (two) times daily. 10/16/15  Yes Bettey Costa, MD  hydrALAZINE (APRESOLINE) 100 MG tablet Take 100 mg by mouth 2 (two) times daily.   Yes Historical Provider, MD  isosorbide mononitrate (IMDUR) 120 MG 24 hr tablet Take 240 mg by mouth daily.  05/30/15  Yes Historical Provider, MD  nitroGLYCERIN (NITROSTAT) 0.4 MG SL tablet Place 0.4 mg under the tongue every 5 (five) minutes as needed.  11/27/14  Yes Historical Provider, MD  potassium chloride SA (K-DUR,KLOR-CON) 20 MEQ tablet Take 20 mEq by mouth daily.   Yes Historical Provider, MD  ranolazine (RANEXA) 500 MG 12 hr tablet Take 1 tablet (500 mg total) by mouth 2 (two) times daily. 10/16/15  Yes Bettey Costa, MD  tamsulosin (FLOMAX) 0.4 MG CAPS capsule Take 0.4 mg by mouth daily.  11/27/14  Yes Historical Provider, MD      PHYSICAL EXAMINATION:   VITAL SIGNS: Blood pressure 93/65, pulse 65, temperature 98.8 F (37.1 C), temperature source Oral, resp. rate 16, height 5\' 8"  (1.727 m), weight 64 kg (141 lb), SpO2 100 %.  GENERAL:  75 y.o.-year-old patient lying in the bed with no acute distress.  EYES: Pupils equal, round, reactive to light and accommodation. No scleral icterus. Extraocular muscles intact.  HEENT: Head atraumatic, normocephalic. Oropharynx and nasopharynx clear.  NECK:  Supple, no jugular venous distention. No thyroid enlargement, no tenderness.  LUNGS: Normal breath sounds  bilaterally, no wheezing, rales,rhonchi or crepitation. No use of accessory muscles of respiration.  CARDIOVASCULAR: S1, S2 normal. No murmurs, rubs, or gallops.  ABDOMEN: Soft, nontender, nondistended. Bowel sounds present. No organomegaly or mass. Foley catheter in place. EXTREMITIES: No pedal edema, cyanosis, or clubbing.  NEUROLOGIC: Cranial nerves II through XII are intact. Muscle strength 3 /5 in both lower extremities, 4 out of 5 in both upper extremities.. Sensation intact. Gait not checked.  PSYCHIATRIC: The patient is alert and oriented x 3.  SKIN: No obvious rash, lesion, or ulcer.   LABORATORY PANEL:   CBC  Recent Labs Lab 11/04/2015 1011  WBC 3.7*  HGB 8.6*  HCT 25.5*  PLT 87*  MCV 93.9  MCH 31.7  MCHC 33.7  RDW 14.8*   ------------------------------------------------------------------------------------------------------------------  Chemistries  Recent Labs Lab 11/11/2015 1011  NA 135  K 4.7  CL 100*  CO2 21*  GLUCOSE 111*  BUN 94*  CREATININE 4.42*  CALCIUM 8.9   ------------------------------------------------------------------------------------------------------------------ estimated creatinine clearance is 13.1 mL/min (by C-G formula based on SCr of 4.42 mg/dL). ------------------------------------------------------------------------------------------------------------------ No results for input(s): TSH, T4TOTAL, T3FREE, THYROIDAB in the last 72 hours.  Invalid input(s): FREET3   Coagulation profile No results for input(s): INR, PROTIME in the last 168 hours. ------------------------------------------------------------------------------------------------------------------- No results for input(s): DDIMER in the last 72 hours. -------------------------------------------------------------------------------------------------------------------  Cardiac Enzymes  Recent Labs Lab 11/22/2015 1011  TROPONINI 0.16*    ------------------------------------------------------------------------------------------------------------------ Invalid input(s): POCBNP  ---------------------------------------------------------------------------------------------------------------  Urinalysis    Component Value Date/Time   COLORURINE AMBER (A) 11/17/2015 1011   APPEARANCEUR CLOUDY (A) 11/21/2015 1011   LABSPEC 1.014 11/20/2015 1011   PHURINE 7.0 11/12/2015 1011   GLUCOSEU NEGATIVE 10/26/2015 1011   HGBUR 1+ (A) 11/04/2015 1011   BILIRUBINUR NEGATIVE 11/21/2015 1011   KETONESUR NEGATIVE 11/05/2015 1011   PROTEINUR 100 (A) 11/17/2015 1011   NITRITE NEGATIVE 11/05/2015 1011   LEUKOCYTESUR 3+ (A) 10/29/2015 1011     RADIOLOGY: No results found.  EKG: Orders placed or performed during the hospital encounter of 11/08/2015  . ED EKG  . ED EKG    IMPRESSION AND PLAN:  * UTI   Along with hypotension and lactic acidosis    history of urinary retention with prostatic hypertrophy    Patient has a indwelling Foley catheter for last 2 weeks.   I spoke to urologist on-call he suggested to treat the urine infection at this time as there might not be doing any procedures with infection.   Foley catheter is changed by ER physician. We will repeat urinalysis from this new catheter again today.   Ceftriaxone for now and urine culture is already sent from the previous sample.   He is appointment with Dr. Erlene Quan on 13 September, and neurology suggested to continue with that appointment as of now.    IV fluid hydration and repeat lactic acid again.  * Acute on chronic renal failure   Baseline creatinine is 2.5 as per previous record in last few months.   IV fluids for now and monitor for improvement in renal function.  * Congestive heart failure   This is chronic and currently there is no acute signs, we will monitor with IV fluids.  * History of hypertension   He is on multiple hypertensive medications,  currently we'll hold all of them because he presented with hypotension.  * Generalized weakness   Physical therapy evaluation.  * Spinal stenosis   This is a chronic issue and he is also wearing a cervical collar, he follows with a neurologist and [place] for this issue.   Advised to continue as his plan.  * Elevated troponin   This could be a combination of chronic renal failure and infection with hypotension    Monitor on tele and follow serial troponin.  All the records are reviewed and case discussed with ED provider. Management plans discussed with the patient, family and they are in agreement.  CODE STATUS:Full code  Code Status History    Date Active Date Inactive Code Status Order ID Comments User Context   10/11/2015  5:31 PM 10/12/2015  6:04 PM Full Code [id number]  Demetrios Loll, MD ED     Plan discussed with patient's wife and multiple cyst is present in the room at the time of admission.  TOTAL TIME  TAKING CARE OF THIS PATIENT: 50 minutes.    Vaughan Basta M.D on 11/02/2015   Between 7am to 6pm - Pager - [phone]  After 6pm go to [email or web address] - password [email or web address] [facility]  Office  [phone]  CC: Primary care physician; [facility]   Note: This dictation was prepared with Dragon dictation along with smaller phrase technology. Any transcriptional errors that result from this process are unintentional.

## 2015-11-02 ENCOUNTER — Inpatient Hospital Stay: Payer: Medicare HMO

## 2015-11-02 ENCOUNTER — Encounter: Payer: Self-pay | Admitting: Family

## 2015-11-02 ENCOUNTER — Encounter: Payer: Self-pay | Admitting: General Practice

## 2015-11-02 DIAGNOSIS — N390 Urinary tract infection, site not specified: Secondary | ICD-10-CM

## 2015-11-02 DIAGNOSIS — I469 Cardiac arrest, cause unspecified: Secondary | ICD-10-CM

## 2015-11-02 DIAGNOSIS — J9601 Acute respiratory failure with hypoxia: Secondary | ICD-10-CM

## 2015-11-02 LAB — BLOOD GAS, ARTERIAL
ACID-BASE DEFICIT: 1 mmol/L (ref 0.0–2.0)
Acid-base deficit: 7.2 mmol/L — ABNORMAL HIGH (ref 0.0–2.0)
Bicarbonate: 20.4 mmol/L (ref 20.0–28.0)
Bicarbonate: 22.2 mmol/L (ref 20.0–28.0)
FIO2: 0.4
FIO2: 0.4
O2 SAT: 65.2 %
O2 SAT: 76.6 %
PATIENT TEMPERATURE: 37
PATIENT TEMPERATURE: 37
PCO2 ART: 51 mmHg — AB (ref 32.0–48.0)
PO2 ART: 42 mmHg — AB (ref 83.0–108.0)
pCO2 arterial: 32 mmHg (ref 32.0–48.0)
pH, Arterial: 7.21 — ABNORMAL LOW (ref 7.350–7.450)
pH, Arterial: 7.45 (ref 7.350–7.450)
pO2, Arterial: 39 mmHg — CL (ref 83.0–108.0)

## 2015-11-02 LAB — CBC
HEMATOCRIT: 23.6 % — AB (ref 40.0–52.0)
Hemoglobin: 8 g/dL — ABNORMAL LOW (ref 13.0–18.0)
MCH: 31.9 pg (ref 26.0–34.0)
MCHC: 33.8 g/dL (ref 32.0–36.0)
MCV: 94.4 fL (ref 80.0–100.0)
PLATELETS: 74 10*3/uL — AB (ref 150–440)
RBC: 2.49 MIL/uL — AB (ref 4.40–5.90)
RDW: 14.9 % — ABNORMAL HIGH (ref 11.5–14.5)
WBC: 4.1 10*3/uL (ref 3.8–10.6)

## 2015-11-02 LAB — COMPREHENSIVE METABOLIC PANEL
ALBUMIN: 3 g/dL — AB (ref 3.5–5.0)
ALT: 18 U/L (ref 17–63)
ANION GAP: 13 (ref 5–15)
AST: 38 U/L (ref 15–41)
Alkaline Phosphatase: 81 U/L (ref 38–126)
BUN: 93 mg/dL — AB (ref 6–20)
CHLORIDE: 106 mmol/L (ref 101–111)
CO2: 20 mmol/L — ABNORMAL LOW (ref 22–32)
Calcium: 8.6 mg/dL — ABNORMAL LOW (ref 8.9–10.3)
Creatinine, Ser: 4.22 mg/dL — ABNORMAL HIGH (ref 0.61–1.24)
GFR calc Af Amer: 15 mL/min — ABNORMAL LOW (ref >60)
GFR calc non Af Amer: 13 mL/min — ABNORMAL LOW (ref >60)
GLUCOSE: 84 mg/dL (ref 65–99)
POTASSIUM: 4.7 mmol/L (ref 3.5–5.1)
SODIUM: 139 mmol/L (ref 135–145)
Total Bilirubin: 0.7 mg/dL (ref 0.3–1.2)
Total Protein: 5.3 g/dL — ABNORMAL LOW (ref 6.5–8.1)

## 2015-11-02 LAB — GLUCOSE, CAPILLARY
GLUCOSE-CAPILLARY: 105 mg/dL — AB (ref 65–99)
GLUCOSE-CAPILLARY: 75 mg/dL (ref 65–99)
Glucose-Capillary: 111 mg/dL — ABNORMAL HIGH (ref 65–99)
Glucose-Capillary: 75 mg/dL (ref 65–99)
Glucose-Capillary: 60 mg/dL — ABNORMAL LOW (ref 65–99)
Glucose-Capillary: 158 mg/dL — ABNORMAL HIGH (ref 65–99)

## 2015-11-02 LAB — TROPONIN I: Troponin I: 0.2 ng/mL (ref <0.03)

## 2015-11-02 LAB — LACTIC ACID, PLASMA
LACTIC ACID, VENOUS: 4.5 mmol/L — AB (ref 0.5–1.9)
LACTIC ACID, VENOUS: 2.7 mmol/L — AB (ref 0.5–1.9)

## 2015-11-02 LAB — PHOSPHORUS
Phosphorus: 5.1 mg/dL — ABNORMAL HIGH (ref 2.5–4.6)
Phosphorus: 4.3 mg/dL (ref 2.5–4.6)

## 2015-11-02 LAB — MRSA PCR SCREENING: MRSA by PCR: NEGATIVE

## 2015-11-02 LAB — MAGNESIUM
MAGNESIUM: 2 mg/dL (ref 1.7–2.4)
Magnesium: 2.1 mg/dL (ref 1.7–2.4)

## 2015-11-02 LAB — FIBRIN DERIVATIVES D-DIMER (ARMC ONLY): FIBRIN DERIVATIVES D-DIMER (ARMC): 1851 — AB (ref 0–499)

## 2015-11-02 MED ORDER — PRO-STAT SUGAR FREE PO LIQD
30.0000 mL | Freq: Two times a day (BID) | ORAL | Status: DC
  Administered 2015-11-02 – 2015-11-06 (×7): 30 mL

## 2015-11-02 MED ORDER — HEPARIN (PORCINE) IN NACL 100-0.45 UNIT/ML-% IJ SOLN
1350.0000 [IU]/h | INTRAMUSCULAR | Status: DC
  Administered 2015-11-02: 800 [IU]/h via INTRAVENOUS
  Administered 2015-11-03 – 2015-11-04 (×2): 1100 [IU]/h via INTRAVENOUS
  Administered 2015-11-05: 1350 [IU]/h via INTRAVENOUS
  Filled 2015-11-02 (×6): qty 250

## 2015-11-02 MED ORDER — FENTANYL CITRATE (PF) 100 MCG/2ML IJ SOLN
50.0000 ug | Freq: Once | INTRAMUSCULAR | Status: AC
  Administered 2015-11-03: 50 ug via INTRAVENOUS

## 2015-11-02 MED ORDER — ONDANSETRON HCL 4 MG/2ML IJ SOLN: 4.0000 mg | Freq: Four times a day (QID) | INTRAMUSCULAR | Status: DC | PRN

## 2015-11-02 MED ORDER — SODIUM CHLORIDE 0.9 % IV SOLN: 250.0000 mL | INTRAVENOUS | Status: DC | PRN

## 2015-11-02 MED ORDER — ACETAMINOPHEN 325 MG PO TABS
650.0000 mg | ORAL_TABLET | ORAL | Status: DC | PRN
Start: 2015-11-02 — End: 2015-11-10

## 2015-11-02 MED ORDER — MIDAZOLAM HCL 2 MG/2ML IJ SOLN
2.0000 mg | INTRAMUSCULAR | Status: DC | PRN
  Administered 2015-11-03 – 2015-11-06 (×3): 2 mg via INTRAVENOUS
  Filled 2015-11-02 (×5): qty 2

## 2015-11-02 MED ORDER — SODIUM CHLORIDE 0.9 % IV BOLUS (SEPSIS)
500.0000 mL | Freq: Once | INTRAVENOUS | Status: AC
  Administered 2015-11-02: 500 mL via INTRAVENOUS

## 2015-11-02 MED ORDER — FENTANYL 2500MCG IN NS 250ML (10MCG/ML) PREMIX INFUSION
25.0000 ug/h | INTRAVENOUS | Status: DC
  Administered 2015-11-02: 100 ug/h via INTRAVENOUS
  Administered 2015-11-02 – 2015-11-03 (×2): 125 ug/h via INTRAVENOUS
  Administered 2015-11-04: 100 ug/h via INTRAVENOUS
  Administered 2015-11-05: 150 ug/h via INTRAVENOUS
  Administered 2015-11-05: 200 ug/h via INTRAVENOUS
  Administered 2015-11-06: 25 ug/h via INTRAVENOUS
  Filled 2015-11-02 (×6): qty 250

## 2015-11-02 MED ORDER — DEXTROSE 50 % IV SOLN
50.0000 mL | Freq: Once | INTRAVENOUS | Status: AC
  Administered 2015-11-02: 50 mL via INTRAVENOUS

## 2015-11-02 MED ORDER — FENTANYL 2500MCG IN NS 250ML (10MCG/ML) PREMIX INFUSION
INTRAVENOUS | Status: AC
  Filled 2015-11-02: qty 250

## 2015-11-02 MED ORDER — SODIUM CHLORIDE 0.9% FLUSH
3.0000 mL | Freq: Two times a day (BID) | INTRAVENOUS | Status: DC
  Administered 2015-11-02 – 2015-11-09 (×17): 3 mL via INTRAVENOUS

## 2015-11-02 MED ORDER — SODIUM CHLORIDE 0.9% FLUSH: 3.0000 mL | INTRAVENOUS | Status: DC | PRN

## 2015-11-02 MED ORDER — PANTOPRAZOLE SODIUM 40 MG PO PACK: 40.0000 mg | PACK | Freq: Every day | ORAL | Status: DC

## 2015-11-02 MED ORDER — FAMOTIDINE IN NACL 20-0.9 MG/50ML-% IV SOLN
20.0000 mg | INTRAVENOUS | Status: DC
  Administered 2015-11-03 – 2015-11-09 (×7): 20 mg via INTRAVENOUS
  Filled 2015-11-02 (×8): qty 50

## 2015-11-02 MED ORDER — FENTANYL BOLUS VIA INFUSION
25.0000 ug | INTRAVENOUS | Status: DC | PRN
  Administered 2015-11-02 – 2015-11-07 (×17): 25 ug via INTRAVENOUS
  Filled 2015-11-02: qty 25

## 2015-11-02 MED ORDER — NOREPINEPHRINE 4 MG/250ML-% IV SOLN
2.0000 ug/min | INTRAVENOUS | Status: DC
  Administered 2015-11-02: 2 ug/min via INTRAVENOUS
  Filled 2015-11-02 (×2): qty 250

## 2015-11-02 MED ORDER — PIPERACILLIN-TAZOBACTAM 3.375 G IVPB
3.3750 g | Freq: Two times a day (BID) | INTRAVENOUS | Status: DC
  Administered 2015-11-02 – 2015-11-03 (×3): 3.375 g via INTRAVENOUS
  Filled 2015-11-02 (×3): qty 50

## 2015-11-02 MED ORDER — SODIUM CHLORIDE 0.9 % IV SOLN
INTRAVENOUS | Status: DC
  Administered 2015-11-02 – 2015-11-04 (×3): via INTRAVENOUS

## 2015-11-02 MED ORDER — DEXTROSE 50 % IV SOLN
INTRAVENOUS | Status: AC
  Filled 2015-11-02: qty 50

## 2015-11-02 MED ORDER — VITAL HIGH PROTEIN PO LIQD
1000.0000 mL | ORAL | Status: DC
  Administered 2015-11-02 – 2015-11-04 (×2): 1000 mL
  Administered 2015-11-04 (×4)

## 2015-11-02 MED ORDER — NOREPINEPHRINE BITARTRATE 1 MG/ML IV SOLN: 2.0000 ug/min | INTRAVENOUS | Status: DC

## 2015-11-02 MED ORDER — SODIUM CHLORIDE 0.9 % IV SOLN
25.0000 ug/h | INTRAVENOUS | Status: DC
  Administered 2015-11-02: 75 ug/h via INTRAVENOUS

## 2015-11-02 MED ORDER — FAMOTIDINE IN NACL 20-0.9 MG/50ML-% IV SOLN
20.0000 mg | Freq: Two times a day (BID) | INTRAVENOUS | Status: DC
  Administered 2015-11-02 (×2): 20 mg via INTRAVENOUS
  Filled 2015-11-02 (×2): qty 50

## 2015-11-02 MED ORDER — MIDAZOLAM HCL 2 MG/2ML IJ SOLN
2.0000 mg | INTRAMUSCULAR | Status: AC | PRN
  Administered 2015-11-02 (×3): 2 mg via INTRAVENOUS
  Filled 2015-11-02 (×3): qty 2

## 2015-11-02 NOTE — Progress Notes (Signed)
Patient's sister and wife updated oncoming RNs about patient's history. They reported that patient had severe difficulty swallowing, resulting in roughly 30 lbs of weight loss prior to admission. They also stated patient had severe problems with neck and was seeing neurologist. Patient currently wearing soft neck brace and family reported that patient could not have head jostled or turned harshly or he "could be permanently paralyzed". Patient's sister also adamant that patient could not be laid back and he cannot breathe when lying flat. MD aware of all of these as he was present when sister explained the second time.

## 2015-11-02 NOTE — Progress Notes (Signed)
Pharmacy Antibiotic Note  Robert Parks is a 75 y.o. male admitted on 10/29/2015 with UTI.  Pharmacy has been consulted for piperacillin/tazobactam dosing for UTI and possible aspiration.  This is day #1 of antibiotics  Plan: Continue piperacillin/tazobactam 3.375 g IV q12h EI  Height: 5\' 8"  (172.7 cm) Weight: 154 lb 5.2 oz (70 kg) IBW/kg (Calculated) : 68.4  Temp (24hrs), Avg:97.8 F (36.6 C), Min:97.7 F (36.5 C), Max:98 F (36.7 C)   Recent Labs Lab 11/02/2015 1011 11/24/2015 1113 11/11/2015 1415 11/02/15 0228 11/02/15 0543  WBC 3.7*  --   --   --  4.1  CREATININE 4.42*  --   --   --  4.22*  LATICACIDVEN  --  2.4* 1.7 4.5* 2.7*    Estimated Creatinine Clearance: 14.6 mL/min (by C-G formula based on SCr of 4.22 mg/dL).    Allergies  Allergen Reactions  . No Known Allergies Other (See Comments)    Antimicrobials this admission: Ceftriaxone 1 dose on 9/7 Piperacillin/tazobactam 9/8 >>  Dose adjustments this admission: n/a  Microbiology results: 9/7 BCx: No growth <24 hours 9/7 UCx: Sent  9/7 UA: LE(+) NO2(-) WBC TNTC  Thank you for allowing pharmacy to be a part of this patient's care.  Lenis Noon, PharmD, BCPS Clinical Pharmacist 11/02/2015 11:10 AM

## 2015-11-02 NOTE — ED Provider Notes (Signed)
[facility]  Department of Emergency Medicine   Code Blue CONSULT NOTE  Chief Complaint: Cardiac arrest/unresponsive   Level V Caveat: Unresponsive  History of present illness: I was contacted by the hospital for a CODE BLUE cardiac arrest upstairs and presented to the patient's bedside.  According to the nurse, he suffered a bradysystolic arrest. Staff performing chest compressions upon my arrival. Dr. Jannifer Franklin at bedside to run the code while I attend to the airway.  ROS: Unable to obtain, Level V caveat  Scheduled Meds: . aspirin EC  81 mg Oral Daily  . atorvastatin  10 mg Oral q1800  . cefTRIAXone (ROCEPHIN)  IV  1 g Intravenous Q24H  . colchicine  0.3 mg Oral Daily  . heparin  5,000 Units Subcutaneous Q8H  . isosorbide mononitrate  240 mg Oral Daily  . sodium chloride flush  3 mL Intravenous Q12H  . tamsulosin  0.4 mg Oral Daily   Continuous Infusions: . sodium chloride 75 mL/hr at 10/29/2015 1653   PRN Meds:. Past Medical History:  Diagnosis Date  . Anginal pain ([place])    had angina 2-3 mths prior to visit  . Arthritis   . CHF (congestive heart failure) ([place])   . Chronic kidney disease    follwed by Nephrologist Dr Juanito Doom in [place], Stage 3  . Coronary artery disease   . DDD (degenerative disc disease), cervical   . Diabetes mellitus without complication (HCC)    DIET CONTROLLED   . Frequent urination   . GERD (gastroesophageal reflux disease)   . Gout   . Hyperlipidemia   . Hypertension   . MI (myocardial infarction) ([place])   . Pancytopenia ([place])   . Shortness of breath dyspnea    at rest, mainly when he's active  . Stroke [facility])    ?? small stroke om 2017  came and went   Past Surgical History:  Procedure Laterality Date  . APPENDECTOMY    . BACK SURGERY  1980'S  . CARDIAC CATHETERIZATION     back in late 1990's [facility]  . COLONOSCOPY WITH PROPOFOL N/A 08/21/2014   Procedure: COLONOSCOPY WITH PROPOFOL;  Surgeon: Hulen Luster, MD;   Location: [facility] ENDOSCOPY;  Service: Gastroenterology;  Laterality: N/A;  . CORONARY ARTERY BYPASS GRAFT  1994  . EYE SURGERY Left    Cataract   Social History   Social History  . Marital status: Married    Spouse name: N/A  . Number of children: N/A  . Years of education: N/A   Occupational History  . Not on file.   Social History Main Topics  . Smoking status: Former Smoker    Years: 30.00    Types: Cigarettes  . Smokeless tobacco: Never Used     Comment: quit in his 57's  . Alcohol use No  . Drug use: No  . Sexual activity: Not on file   Other Topics Concern  . Not on file   Social History Narrative  . No narrative on file   Allergies  Allergen Reactions  . No Known Allergies Other (See Comments)    Last set of Vital Signs (not current) Vitals:   11/02/2015 1501 10/30/2015 2041  BP: (!) 91/55 99/67  Pulse: 63 67  Resp: (!) 30 18  Temp:  98 F (36.7 C)      Physical Exam  Gen: unresponsive, soft neck collar collar Cardiovascular: pulseless  Resp: Breath sounds equal bilaterally with bagging  Abd: nondistended  Neuro: GCS  3, unresponsive to pain  HEENT: No blood in posterior pharynx, gag reflex absent  Neck: No crepitus  Musculoskeletal: No deformity  Skin: warm  Procedures  INTUBATION Performed by: Paulette Blanch Required items: required blood products, implants, devices, and special equipment available Patient identity confirmed: provided demographic data and hospital-assigned identification number Time out: Immediately prior to procedure a "time out" was called to verify the correct patient, procedure, equipment, support staff and site/side marked as required. Indications: Cardiopulmonary arrest  Intubation method: Glidescope Preoxygenation: BVM Sedatives: None Paralytic: None Tube Size: 7.5 cuffed Post-procedure assessment: chest rise and ETCO2 monitor Breath sounds: equal and absent over the epigastrium Tube secured by Respiratory  Therapy Patient tolerated the procedure well with no immediate complications.  CRITICAL CARE Performed by: Paulette Blanch Total critical care time: 15 minutes Critical care time was exclusive of separately billable procedures and treating other patients. Critical care was necessary to treat or prevent imminent or life-threatening deterioration. Critical care was time spent personally by me on the following activities: development of treatment plan with patient and/or surrogate as well as nursing, discussions with consultants, evaluation of patient's response to treatment, examination of patient, obtaining history from patient or surrogate, ordering and performing treatments and interventions, ordering and review of laboratory studies, ordering and review of radiographic studies, pulse oximetry and re-evaluation of patient's condition.           Paulette Blanch, MD 11/02/15 [phone]

## 2015-11-02 NOTE — Progress Notes (Signed)
CH met with/escorted spouse & family to ICU; offered spiritual support, presence and prayer.  [place] Robert [phone]

## 2015-11-02 NOTE — Progress Notes (Signed)
PT Cancellation Note  Patient Details Name: Robert Parks MRN: [id number] DOB: [birth year]   Cancelled Treatment:    Reason Eval/Treat Not Completed: Medical issues which prohibited therapy. Pt with transfer to CCU secondary to Code Blue initiated 11/02/15. Pt is now on vent. Secondary to change in status, will need new orders to initiate therapy. Will dc current orders.   Wing Schoch 11/02/2015, 9:53 AM  Greggory Stallion, PT, DPT [phone]

## 2015-11-02 NOTE — Progress Notes (Signed)
[facility] [place] NAME: Robert Parks    MR#:  [id number]  DATE OF BIRTH:  1941-02-15  SUBJECTIVE:  CHIEF COMPLAINT:   Chief Complaint  Patient presents with  . Extremity Weakness  The patient is 75 year old African American male with past medical history significant for history of cardiomyopathy with ejection fraction of [id number], chronic systolic CHF, CAD with baseline creatinine of 2.5, coronary artery disease, degenerative disc disease, diabetes mellitus, hyperlipidemia, hypertension, who was recently admitted to the hospital for CHF, was found to have BPH and urinary retention, was sent with Foley catheter presents back to the hospital with complaints of significant weakness, inability to walk. He presented to the hospital with hypotension, lactic acidosis, pyuria. He was admitted to the hospital for your tract infection, while in the hospital, he became unresponsive, according blue was called and patient was noted to be in PA/asystole for approximately 10 minutes, he was intubated and transferred to CCU, remains on full vent support,on 35% FiO2. Initial PO2 was low in 30s to 40s. Patient remains hypotensive, however, not tachycardic area. The labs also revealed acute on chronic renal failure with creatinine of 4.22, down from 4.42 yesterday, patient also was noted to have mild elevation of troponin, maximum 0.16, thrombocytopenia, anemia and leukopenia. Unable to get review of systems since patient is intubated.  Review of Systems  Unable to perform ROS: Intubated    VITAL SIGNS: Blood pressure (!) 88/57, pulse 67, temperature 97 F (36.1 C), temperature source Axillary, resp. rate 20, height 5\' 8"  (1.727 m), weight 70 kg (154 lb 5.2 oz), SpO2 100 %.  PHYSICAL EXAMINATION:   GENERAL:  75 y.o.-year-old patient lying in the bed with no acute distress. Intubated, sedated during my evaluation, however, was able to open his eyes, no purposeful  movements, able to move her upper extremities, per nursing staff EYES: Pupils 1 mm, round, reactive to light and accommodation. No scleral icterus. Extraocular muscles intact.  HEENT: Head atraumatic, normocephalic. Oropharynx and nasopharynx clear. ET tube is in place. Neck has soft collar NECK:  Supple, no jugular venous distention. No thyroid enlargement, no tenderness. Soft collar LUNGS: Normal breath sounds bilaterally, no wheezing, rales,rhonchi or crepitation. No use of accessory muscles of respiration.  CARDIOVASCULAR: S1, S2 normal. No murmurs, rubs, or gallops.  ABDOMEN: Soft, nontender, nondistended. Bowel sounds present. No organomegaly or mass.  EXTREMITIES: 2+ lower extremity and pedal edema, no cyanosis, or clubbing.  NEUROLOGIC: Cranial nerves II through XII are not able to evaluate this patient is sedated. Muscle strength . Sensation intact. Gait not checked.  PSYCHIATRIC: The patient is alert and oriented x 3.  SKIN: No obvious rash, lesion, or ulcer.   ORDERS/RESULTS REVIEWED:   CBC  Recent Labs Lab 11/15/2015 1011 11/02/15 0543  WBC 3.7* 4.1  HGB 8.6* 8.0*  HCT 25.5* 23.6*  PLT 87* 74*  MCV 93.9 94.4  MCH 31.7 31.9  MCHC 33.7 33.8  RDW 14.8* 14.9*   ------------------------------------------------------------------------------------------------------------------  Chemistries   Recent Labs Lab 11/18/2015 1011 11/02/15 0543  NA 135 139  K 4.7 4.7  CL 100* 106  CO2 21* 20*  GLUCOSE 111* 84  BUN 94* 93*  CREATININE 4.42* 4.22*  CALCIUM 8.9 8.6*  MG  --  2.1  AST  --  38  ALT  --  18  ALKPHOS  --  81  BILITOT  --  0.7   ------------------------------------------------------------------------------------------------------------------ estimated creatinine clearance is 14.6 mL/min (by C-G  formula based on SCr of 4.22 mg/dL). ------------------------------------------------------------------------------------------------------------------ No results for  input(s): TSH, T4TOTAL, T3FREE, THYROIDAB in the last 72 hours.  Invalid input(s): FREET3  Cardiac Enzymes  Recent Labs Lab 11/18/2015 1717 10/29/2015 2218 11/02/15 0543  TROPONINI 0.14* 0.13* 0.20*   ------------------------------------------------------------------------------------------------------------------ Invalid input(s): POCBNP ---------------------------------------------------------------------------------------------------------------  RADIOLOGY: Dg Abd 1 View  Result Date: 11/02/2015 CLINICAL DATA:  Orogastric tube placement.  Initial encounter. EXAM: ABDOMEN - 1 VIEW COMPARISON:  None. FINDINGS: The patient's enteric tube is noted ending overlying the body of the stomach. The visualized bowel gas pattern is grossly unremarkable. No free intra-abdominal air is seen, though evaluation for free air is limited on a single supine view. A small right pleural effusion is noted. No acute osseous abnormalities are seen. IMPRESSION: 1. Enteric tube noted ending overlying the body of the stomach. 2. Small right pleural effusion noted. Electronically Signed   By: Garald Balding M.D.   On: 11/02/2015 03:05   Ct Head Wo Contrast  Result Date: 11/02/2015 CLINICAL DATA:  Status post cardiac arrest EXAM: CT HEAD WITHOUT CONTRAST TECHNIQUE: Contiguous axial images were obtained from the base of the skull through the vertex without intravenous contrast. COMPARISON:  MRI brain dated 06/08/2015 FINDINGS: Brain: No evidence of acute infarction, hemorrhage, hydrocephalus, extra-axial collection or mass lesion/mass effect. Vascular: No hyperdense vessel or unexpected calcification. Skull: No evidence of calvarial fracture. Sinuses/Orbits: The visualized paranasal sinuses are essentially clear. The mastoid air cells are unopacified. Other: Subcortical white matter and periventricular small vessel ischemic changes. Intracranial atherosclerosis. Global cortical atrophy.  No ventriculomegaly. IMPRESSION: No  evidence of acute intracranial abnormality. Atrophy with small vessel ischemic changes. Electronically Signed   By: Julian Hy M.D.   On: 11/02/2015 09:42   Dg Chest Port 1 View  Result Date: 11/02/2015 CLINICAL DATA:  75 year old male with endotracheal tube placement. EXAM: PORTABLE CHEST 1 VIEW COMPARISON:  Chest radiograph dated 10/11/2015 FINDINGS: An endotracheal tube with tip approximately 4.5 cm above the carina and an enteric tube coursing into the left hemi abdomen with tip beyond the inferior margin of the image noted. There is stable cardiomegaly with mild vascular congestion. There is a small right pleural effusion, new or increased from prior study. There is associated partial compressive atelectasis of the right lower lobe. Pneumonia is not excluded. Clinical correlation and follow-up recommended. The left lung is clear. There is no pneumothorax. Median sternotomy wires and CABG vascular clips noted. No acute osseous pathology. IMPRESSION: Endotracheal tube above the carina. Cardiomegaly with mild congestive changes and small right pleural effusion. Superimposed pneumonia is not excluded. Clinical correlation and follow-up recommended. Electronically Signed   By: Anner Crete M.D.   On: 11/02/2015 03:01    EKG:  Orders placed or performed during the hospital encounter of 11/02/2015  . ED EKG  . ED EKG    ASSESSMENT AND PLAN:  Principal Problem:   UTI (lower urinary tract infection) Active Problems:   Cardiac arrest ([place])   Acute respiratory failure (HCC)  #1. Sepsis due to Urinary tract infection, questionable acute pyelonephritis, continue IV fluids, blood pressure is low. Stable, initiate patient on pressors if needed. Blood cultures are negative so far #2. Urinary tract infection, continue patient on Zosyn, awaiting for urinary cultures, so far negative #3. Cardiac arrest, etiology remains unclear, getting cardiologist involved for further recommendations,  echocardiogram was performed In August 2017, ejection fraction was found to be 35%, diffuse hypokinesis, diastolic dysfunction, grade 1, trivial regurgitation of aorta, moderate mitral regurgitation, severe  tricuspid regurgitation, elevated pulmonary arterial pressure . Will not be repeating echocardiogram on this admission. Cardiac enzymes are elevated, possibly demand ischemia. However. Unable to use nitroglycerin or beta blockers due to bradycardia and hypotension, continue patient on aspirin therapy, Lipitor, Imdur, get palliative care involved #4. Acute on chronic renal failure, patient still makes urine, get nephrologist involved for further recommendations, patient had renal ultrasound in August 2017, unremarkable #5 acute hypoxic respiratory failure, etiology remains unclear, get d-dimer, initiate patient on heparin if  D-dimer is elevated, appreciate pulmonary involvement, continue mechanical ventilation as per pulmonary/intensivist #6. Lower extremity edema, and get Dopplers to rule out DVT   Management plans discussed with the patient, family and they are in agreement.   DRUG ALLERGIES:  Allergies  Allergen Reactions  . No Known Allergies Other (See Comments)    CODE STATUS:     Code Status Orders        Start     Ordered   11/02/15 0109  Full code  Continuous     11/02/15 0111    Code Status History    Date Active Date Inactive Code Status Order ID Comments User Context   11/17/2015  2:53 PM 11/02/2015  1:11 AM Full Code [id number]  Vaughan Basta, MD Inpatient   10/11/2015  5:31 PM 10/12/2015  6:04 PM Full Code [id number]  Demetrios Loll, MD ED      TOTALCritical care TIME TAKING CARE OF THIS PATIENT: 45 minutes.    Theodoro Grist M.D on 11/02/2015 at 1:39 PM  Between 7am to 6pm - Pager - [phone]  After 6pm go to [email or web address] - password [email or web address]  Office  [phone]  CC: Primary care physician; [facility]

## 2015-11-02 NOTE — Plan of Care (Signed)
Pt placed on scds, and ted hose post ultrasound of the lower extremities being completed.  Awaiting heparin from pharmacy to administer

## 2015-11-02 NOTE — Consult Note (Signed)
[facility] CONSULT NOTE    Date: 11/02/2015                  Patient Name:  Robert Parks  MRN: [id number]  DOB: 1940-08-04  Age / Sex: 75 y.o., male         PCP: [facility]                 Service Requesting Consult: Pulmonary/critical care                 Reason for Consult: Acute renal failure/CKD stage IV            History of Present Illness: Patient is a 75 y.o. male with a PMHx of severe coronary artery disease, Congestive heart failure ejection fraction 30-35%, chronic kidney disease stage IV followed by [facility] nephrology, degenerative joint disease, diabetes mellitus type 2, GERD, gout, hyperlipidemia, hypertension, prior history of myocardial infarction, history of CVA, who was admitted to [facility] on 11/21/2015 for evaluation of weakness. When he was brought to the emergency department he was found to have hypotension as well as lactic acid and pyuria. It was felt that he may have early sepsis with urinary tract infection. Unfortunately he suffered a cardiopulmonary arrest while here and he is now on the ventilator.  He also appears to be hypotensive.  He has only made 491 cc of urine today. He is regularly followed by [facility] nephrology. His most recent baseline creatinine appears to be 2.3.  BUNs is currently 30 with a creatinine of 4.2. Potassium is acceptable at 4.7. Lactic acid level was 4.5 post cardiac arrest and is currently 2.7. Family is at bedside. It appears they are favoring a trial of dialysis if deemed necessary.   Medications: Outpatient medications: Prescriptions Prior to Admission  Medication Sig Dispense Refill Last Dose  . acetaminophen (TYLENOL) 650 MG CR tablet Take 650 mg by mouth every 8 (eight) hours as needed.    prn at prn  . aspirin EC 81 MG tablet Take 81 mg by mouth.   10/31/2015 at Unknown time  . atorvastatin (LIPITOR) 10 MG tablet Take 10 mg by mouth daily.    10/31/2015 at Unknown time  . carvedilol (COREG) 3.125 MG  tablet Take 1 tablet (3.125 mg total) by mouth 2 (two) times daily with a meal. 60 tablet 0 10/31/2015 at Unknown time  . colchicine 0.6 MG tablet Take 0.3-0.6 mg by mouth See admin instructions. Take 0.691m once daily to prevent gout, take 1.24mat first sign of gout flare, may follow in 1 hour with 0.91m41mmax 3 tabs   prn at prn  . furosemide (LASIX) 80 MG tablet Take 1 tablet (80 mg total) by mouth 2 (two) times daily. 60 tablet 0 10/31/2015 at Unknown time  . hydrALAZINE (APRESOLINE) 100 MG tablet Take 100 mg by mouth 2 (two) times daily.   10/31/2015 at Unknown time  . isosorbide mononitrate (IMDUR) 120 MG 24 hr tablet Take 240 mg by mouth daily.    10/31/2015 at Unknown time  . nitroGLYCERIN (NITROSTAT) 0.4 MG SL tablet Place 0.4 mg under the tongue every 5 (five) minutes as needed.    prn at prn  . potassium chloride SA (K-DUR,KLOR-CON) 20 MEQ tablet Take 20 mEq by mouth daily.   10/31/2015 at Unknown time  . ranolazine (RANEXA) 500 MG 12 hr tablet Take 1 tablet (500 mg total) by mouth 2 (two) times daily. 60 tablet 0 10/31/2015 at  Unknown time  . tamsulosin (FLOMAX) 0.4 MG CAPS capsule Take 0.4 mg by mouth daily.    10/31/2015 at Unknown time    Current medications: Current Facility-Administered Medications  Medication Dose Route Frequency Provider Last Rate Last Dose  . 0.9 %  sodium chloride infusion  250 mL Intravenous PRN Flora Lipps, MD      . 0.9 %  sodium chloride infusion  250 mL Intravenous PRN Flora Lipps, MD      . acetaminophen (TYLENOL) tablet 650 mg  650 mg Oral Q4H PRN Flora Lipps, MD      . aspirin EC tablet 81 mg  81 mg Oral Daily Vaughan Basta, MD   81 mg at 11/02/15 1019  . atorvastatin (LIPITOR) tablet 10 mg  10 mg Oral q1800 Vaughan Basta, MD   10 mg at 11/02/15 1708  . [START ON 11/03/2015] famotidine (PEPCID) IVPB 20 mg premix  20 mg Intravenous Q24H Laverle Hobby, MD      . feeding supplement (PRO-STAT SUGAR FREE 64) liquid 30 mL  30 mL Per Tube BID Laverle Hobby, MD      . feeding supplement (VITAL HIGH PROTEIN) liquid 1,000 mL  1,000 mL Per Tube Q24H Laverle Hobby, MD   1,000 mL at 11/02/15 1708  . fentaNYL (SUBLIMAZE) bolus via infusion 25 mcg  25 mcg Intravenous Q1H PRN Flora Lipps, MD   25 mcg at 11/02/15 1718  . fentaNYL (SUBLIMAZE) injection 50 mcg  50 mcg Intravenous Once Flora Lipps, MD      . fentaNYL 2561mg in NS 2565m(1027mml) infusion-PREMIX  25-400 mcg/hr Intravenous Continuous KurFlora LippsD 10 mL/hr at 11/02/15 1745 100 mcg/hr at 11/02/15 1745  . heparin ADULT infusion 100 units/mL (25000 units/250m46mdium chloride 0.45%)  800 Units/hr Intravenous Continuous RimaTheodoro Grist 8 mL/hr at 11/02/15 1741 800 Units/hr at 11/02/15 1741  . isosorbide mononitrate (IMDUR) 24 hr tablet 240 mg  240 mg Oral Daily VaibVaughan Basta   240 mg at 11/02/15 1019  . midazolam (VERSED) injection 2 mg  2 mg Intravenous Q15 min PRN KuriFlora Lipps   2 mg at 11/02/15 1627  . midazolam (VERSED) injection 2 mg  2 mg Intravenous Q2H PRN KuriFlora Lipps      . ondansetron (ZOFRAN) injection 4 mg  4 mg Intravenous Q6H PRN KuriFlora Lipps      . piperacillin-tazobactam (ZOSYN) IVPB 3.375 g  3.375 g Intravenous Q12H KuriFlora Lipps   3.375 g at 11/02/15 1459  . sodium chloride flush (NS) 0.9 % injection 3 mL  3 mL Intravenous Q12H VaibVaughan Basta   3 mL at 11/02/15 1020  . sodium chloride flush (NS) 0.9 % injection 3 mL  3 mL Intravenous Q12H KuriFlora Lipps   3 mL at 11/02/15 1019  . sodium chloride flush (NS) 0.9 % injection 3 mL  3 mL Intravenous PRN KuriFlora Lipps      . tamsulosin (FLOMAX) capsule 0.4 mg  0.4 mg Oral Daily VaibVaughan Basta   0.4 mg at 11/02/15 1019      Allergies: Allergies  Allergen Reactions  . No Known Allergies Other (See Comments)      Past Medical History: Past Medical History:  Diagnosis Date  . Anginal pain (HCC)[place] had angina 2-3 mths prior to visit  . Arthritis   . CHF  (congestive heart failure) (HCC)[place]. Chronic kidney disease    follwed by Nephrologist Dr  Colindres in [place], Stage 3  . Coronary artery disease   . DDD (degenerative disc disease), cervical   . Diabetes mellitus without complication (HCC)    DIET CONTROLLED   . Frequent urination   . GERD (gastroesophageal reflux disease)   . Gout   . Hyperlipidemia   . Hypertension   . MI (myocardial infarction) ([place])   . Pancytopenia ([place])   . Shortness of breath dyspnea    at rest, mainly when he's active  . Stroke [facility])    ?? small stroke om 2017  came and went     Past Surgical History: Past Surgical History:  Procedure Laterality Date  . APPENDECTOMY    . BACK SURGERY  1980'S  . CARDIAC CATHETERIZATION     back in late 1990's [facility]  . COLONOSCOPY WITH PROPOFOL N/A 08/21/2014   Procedure: COLONOSCOPY WITH PROPOFOL;  Surgeon: Hulen Luster, MD;  Location: [facility] ENDOSCOPY;  Service: Gastroenterology;  Laterality: N/A;  . CORONARY ARTERY BYPASS GRAFT  1994  . EYE SURGERY Left    Cataract     Family History: Family History  Problem Relation Age of Onset  . Heart attack Mother   . Cancer Father      Social History: Social History   Social History  . Marital status: Married    Spouse name: N/A  . Number of children: N/A  . Years of education: N/A   Occupational History  . Not on file.   Social History Main Topics  . Smoking status: Former Smoker    Years: 30.00    Types: Cigarettes  . Smokeless tobacco: Never Used     Comment: quit in his 80's  . Alcohol use No  . Drug use: No  . Sexual activity: Not on file   Other Topics Concern  . Not on file   Social History Narrative  . No narrative on file     Review of Systems: Patient cannot provide review of systems as he's currently intubated and sedated.  Vital Signs: Blood pressure (!) 87/57, pulse 69, temperature 97.3 F (36.3 C), temperature source Axillary, resp. rate 20, height _0  (1.727 m), weight  70 kg (154 lb 5.2 oz), SpO2 100 %.  Weight trends: Filed Weights   11/16/2015 1002 11/02/15 0500  Weight: 64 kg (141 lb) 70 kg (154 lb 5.2 oz)    Physical Exam: General: Critically ill appearing  Head: Normocephalic, atraumatic.  Eyes: Bilateral arcus senilis, pupils 6m and reactive  Nose: Mucous membranes moist, not inflammed, nonerythematous.  Throat: ETT/OG in place  Neck: Supple, trachea midline.  Lungs:  Bilateral rhonchi, vent assisted  Heart: RRR. S1 and S2 normal without gallop, murmur, or rubs.  Abdomen:  BS normoactive. Soft, Nondistended, non-tender.  No masses or organomegaly.  Extremities: 1+ peripheral edema  Neurologic: Sedated, not responding to verbal stimuli  Skin: No visible rashes, scars.    Lab results: Basic Metabolic Panel:  Recent Labs Lab 11/09/2015 1011 11/02/15 0543 11/02/15 1714  NA 135 139  --   K 4.7 4.7  --   CL 100* 106  --   CO2 21* 20*  --   GLUCOSE 111* 84  --   BUN 94* 93*  --   CREATININE 4.42* 4.22*  --   CALCIUM 8.9 8.6*  --   MG  --  2.1 2.0  PHOS  --  5.1* 4.3    Liver Function Tests:  Recent Labs Lab 11/02/15 0543  AST 38  ALT 18  ALKPHOS 81  BILITOT 0.7  PROT 5.3*  ALBUMIN 3.0*   No results for input(s): LIPASE, AMYLASE in the last 168 hours. No results for input(s): AMMONIA in the last 168 hours.  CBC:  Recent Labs Lab 11/17/2015 1011 11/02/15 0543  WBC 3.7* 4.1  HGB 8.6* 8.0*  HCT 25.5* 23.6*  MCV 93.9 94.4  PLT 87* 74*    Cardiac Enzymes:  Recent Labs Lab 11/23/2015 1011 11/23/2015 1717 11/12/2015 2218 11/02/15 0543  TROPONINI 0.16* 0.14* 0.13* 0.20*    BNP: Invalid input(s): POCBNP  CBG:  Recent Labs Lab 11/02/15 0058 11/02/15 0114 11/02/15 1617  GLUCAP 105* 111* 81    Microbiology: Results for orders placed or performed during the hospital encounter of 10/28/2015  Urine culture     Status: Abnormal (Preliminary result)   Collection Time: 10/26/2015 10:11 AM  Result Value Ref Range  Status   Specimen Description URINE, RANDOM  Final   Special Requests NONE  Final   Culture >=100,000 COLONIES/mL GRAM NEGATIVE RODS (A)  Final   Report Status PENDING  Incomplete  Culture, blood (routine x 2)     Status: None (Preliminary result)   Collection Time: 11/04/2015 11:13 AM  Result Value Ref Range Status   Specimen Description BLOOD RIGHT AC  Final   Special Requests   Final    BOTTLES DRAWN AEROBIC AND ANAEROBIC ANA 8ML AER 10ML   Culture NO GROWTH < 24 HOURS  Final   Report Status PENDING  Incomplete  Culture, blood (routine x 2)     Status: None (Preliminary result)   Collection Time: 11/13/2015 11:13 AM  Result Value Ref Range Status   Specimen Description BLOOD LEFT ARM  Final   Special Requests BOTTLES DRAWN AEROBIC AND ANAEROBIC [place]  Final   Culture NO GROWTH < 24 HOURS  Final   Report Status PENDING  Incomplete  Culture, blood (routine x 2)     Status: None (Preliminary result)   Collection Time: 11/02/15  2:17 AM  Result Value Ref Range Status   Specimen Description BLOOD RIGHT HAND  Final   Special Requests BOTTLES DRAWN AEROBIC AND ANAEROBIC 5CC  Final   Culture NO GROWTH < 12 HOURS  Final   Report Status PENDING  Incomplete  Culture, blood (routine x 2)     Status: None (Preliminary result)   Collection Time: 11/02/15  2:28 AM  Result Value Ref Range Status   Specimen Description BLOOD LEFT HAND  Final   Special Requests BOTTLES DRAWN AEROBIC AND ANAEROBIC [place]  Final   Culture NO GROWTH < 12 HOURS  Final   Report Status PENDING  Incomplete  MRSA PCR Screening     Status: None   Collection Time: 11/02/15 10:42 AM  Result Value Ref Range Status   MRSA by PCR NEGATIVE NEGATIVE Final    Comment:        The GeneXpert MRSA Assay (FDA approved for NASAL specimens only), is one component of a comprehensive MRSA colonization surveillance program. It is not intended to diagnose MRSA infection nor to guide or monitor treatment for MRSA infections.      Coagulation Studies: No results for input(s): LABPROT, INR in the last 72 hours.  Urinalysis:  Recent Labs  11/23/2015 1011 11/06/2015 1415  COLORURINE AMBER* AMBER*  LABSPEC 1.014 1.012  PHURINE 7.0 5.0  GLUCOSEU NEGATIVE NEGATIVE  HGBUR 1+* 3+*  BILIRUBINUR NEGATIVE NEGATIVE  KETONESUR NEGATIVE NEGATIVE  PROTEINUR 100* 100*  NITRITE NEGATIVE NEGATIVE  LEUKOCYTESUR 3+* 3+*      Imaging: Dg Abd 1 View  Result Date: 11/02/2015 CLINICAL DATA:  Orogastric tube placement.  Initial encounter. EXAM: ABDOMEN - 1 VIEW COMPARISON:  None. FINDINGS: The patient's enteric tube is noted ending overlying the body of the stomach. The visualized bowel gas pattern is grossly unremarkable. No free intra-abdominal air is seen, though evaluation for free air is limited on a single supine view. A small right pleural effusion is noted. No acute osseous abnormalities are seen. IMPRESSION: 1. Enteric tube noted ending overlying the body of the stomach. 2. Small right pleural effusion noted. Electronically Signed   By: Garald Balding M.D.   On: 11/02/2015 03:05   Ct Head Wo Contrast  Result Date: 11/02/2015 CLINICAL DATA:  Status post cardiac arrest EXAM: CT HEAD WITHOUT CONTRAST TECHNIQUE: Contiguous axial images were obtained from the base of the skull through the vertex without intravenous contrast. COMPARISON:  MRI brain dated 06/08/2015 FINDINGS: Brain: No evidence of acute infarction, hemorrhage, hydrocephalus, extra-axial collection or mass lesion/mass effect. Vascular: No hyperdense vessel or unexpected calcification. Skull: No evidence of calvarial fracture. Sinuses/Orbits: The visualized paranasal sinuses are essentially clear. The mastoid air cells are unopacified. Other: Subcortical white matter and periventricular small vessel ischemic changes. Intracranial atherosclerosis. Global cortical atrophy.  No ventriculomegaly. IMPRESSION: No evidence of acute intracranial abnormality. Atrophy with small  vessel ischemic changes. Electronically Signed   By: Julian Hy M.D.   On: 11/02/2015 09:42   Ct Cervical Spine Wo Contrast  Result Date: 11/02/2015 CLINICAL DATA:  Cardiac arrest, a systole for about 10 minutes. Neck pain. Intubated. EXAM: CT CERVICAL SPINE WITHOUT CONTRAST TECHNIQUE: Multidetector CT imaging of the cervical spine was performed without intravenous contrast. Multiplanar CT image reconstructions were also generated. COMPARISON:  Multiple exams, including 06/08/2015 MRI FINDINGS: Alignment: 1.5 mm degenerative anterolisthesis at C4-5. Skull base and vertebrae: Anterior spurring and loss of articular space at the C1-2 articulation. Multilevel degenerative facet arthropathy and anterior intervertebral spurring noted, with some completely bridging spurring on the right at C5-6. Loss of disc height at C5-6 with posterior osseous ridging. Several lucencies are present scattered in the vertebral bodies, possibly small hemangiomas but technically nonspecific. Soft tissues and spinal canal: Atherosclerotic calcification of the carotid bulbs bilaterally. Disc protrusion at C4-5 and disc bulge at C5-6. Disc levels: Cervical spondylosis causes osseous foraminal narrowing on the left at C3-4, C4-5, and C5-6; and on the right at C3-4, C4-5, and C5-6. Underlying degenerative disc disease likely contributes to the foraminal impingement at the C4-5 and C5-6 levels. Degenerative disc disease is believed to be causing at least moderate central narrowing of the thecal sac at C4-5 and C5-6. Upper chest: Right pleural effusion. Other: Generalized subcutaneous edema. IMPRESSION: 1. No cervical spine fracture or acute subluxation. 2. Cervical spondylosis and degenerative disc disease, causing impingement at C3-4, C4-5, and C5-6. 3. Right pleural effusion. 4. Subcutaneous edema. 5. Atherosclerotic calcification of the carotid bulbs bilaterally. Electronically Signed   By: Van Clines M.D.   On: 11/02/2015  17:10   US Venous Img Lower Bilateral  Result Date: 11/02/2015 CLINICAL DATA:  Bilateral lower extremity swelling. Short of breath for 1 day. EXAM: BILATERAL LOWER EXTREMITY VENOUS DUPLEX ULTRASOUND TECHNIQUE: Doppler venous assessment of the bilateral lower extremity deep venous system was performed, including characterization of spectral flow, compressibility, and phasicity. COMPARISON:  None. FINDINGS: There is complete compressibility of the bilateral common femoral, femoral, and popliteal veins. Doppler analysis demonstrates respiratory phasicity and augmentation  of flow with calf compression. No obvious superficial vein or calf vein thrombosis. IMPRESSION: No evidence of lower extremity DVT. Electronically Signed   By: Marybelle Killings M.D.   On: 11/02/2015 15:58   Dg Chest Port 1 View  Result Date: 11/02/2015 CLINICAL DATA:  75 year old male with endotracheal tube placement. EXAM: PORTABLE CHEST 1 VIEW COMPARISON:  Chest radiograph dated 10/11/2015 FINDINGS: An endotracheal tube with tip approximately 4.5 cm above the carina and an enteric tube coursing into the left hemi abdomen with tip beyond the inferior margin of the image noted. There is stable cardiomegaly with mild vascular congestion. There is a small right pleural effusion, new or increased from prior study. There is associated partial compressive atelectasis of the right lower lobe. Pneumonia is not excluded. Clinical correlation and follow-up recommended. The left lung is clear. There is no pneumothorax. Median sternotomy wires and CABG vascular clips noted. No acute osseous pathology. IMPRESSION: Endotracheal tube above the carina. Cardiomegaly with mild congestive changes and small right pleural effusion. Superimposed pneumonia is not excluded. Clinical correlation and follow-up recommended. Electronically Signed   By: Anner Crete M.D.   On: 11/02/2015 03:01      Assessment & Plan: Pt is a 75 y.o. male  with a PMHx of severe  coronary artery disease, Congestive heart failure ejection fraction 30-35%, chronic kidney disease stage IV followed by [facility] nephrology, degenerative joint disease, diabetes mellitus type 2, GERD, gout, hyperlipidemia, hypertension, prior history of myocardial infarction, history of CVA, who was admitted to [facility] on 11/22/2015 for evaluation of weakness.  Patient suffered cardiopulmonary arrest shortly after being admitted.  1. Acute renal failure/chronic kidney disease stage IV baseline creatinine 2.3 EGFR 29 followed by [facility] nephrology. The patient has a baseline creatinine of 2.3 with an EGFR 29. Acute renal failure now likely related to sepsis, cardiopulmonary arrest, and hypotension. Patient appears to be oliguric at the moment. We had a long discussion with the patient's wife and she wishes to proceed with renal placement therapy if deemed necessary. If renal function does not improve by the a.m. we will consider continuous renal replacement therapy. Risks, benefits, and alternatives discussed. We will check renal ultrasound to make sure there is no underlying obstruction.  2. Acute respiratory failure. Likely secondary to cardiopulmonary arrest. Continue ventilatory support.  3. Anemia of chronic kidney disease. Hemoglobin currently 8.0. In light of his underlying severe coronary artery disease would suggest low threshold for blood transfusion however we defer this to pulmonary critical care.  4. Hypotension. Recommend pressor therapy to maintain a map of 65 or greater if blood pressure will drop further.  5. In light of severe underlying coronary artery disease, congestive heart failure, acute respiratory failure, and acute renal failure the patient has a very guarded prognosis at this time.

## 2015-11-02 NOTE — Progress Notes (Signed)
Pt. Transported to CT on the ventilator and back to CCU.

## 2015-11-02 NOTE — Progress Notes (Signed)
Called to Code Blue.  Patient admitted here for weakness and workup for UTI.  Tonight he was having some mild pauses on telemetry per nursing report, though he was in sinus rhythm and had stable blood pressure.  He then began to become bradycardic and became unresponsive.  He was found to be pulseless and CPR was started.  Patient received several rounds of epi and atropine.  ROSC was achieved, then patient went into vtach and pulse then lost again with subsequent restarting CPR and epi given and ROSC again achieved.  Patient was intubated during the code.  BP was stable after ROSC, patient planned for transfer to ICU.  Dr Mortimer Fries was present at end of code and aware of transfer.  Jacqulyn Bath [facility] 11/02/2015, 1:40 AM

## 2015-11-02 NOTE — Progress Notes (Signed)
[facility]  Patient Status: Active  [facility] is providing the following services: SN/PT/ST  If patient discharges after hours, please call [phone].   Robert Parks 11/02/2015, 8:49 AM

## 2015-11-02 NOTE — Code Documentation (Addendum)
Call received that patient's heart rate in the 30's. Upon entrance into the room patient was unresponsive and pulseless. CPR began immediately and Code Blue initiated. Dr. Jannifer Franklin and Melina Fiddler at bedside, pt intubated at bedside, see code sheet for details.  Pt had return of ROSC and was transferred to ICU. Wife notified of event.

## 2015-11-02 NOTE — Progress Notes (Signed)
ANTICOAGULATION CONSULT NOTE - Initial Consult  Pharmacy Consult for Heparin  Indication: chest pain/ACS  Allergies  Allergen Reactions  . No Known Allergies Other (See Comments)    Patient Measurements: Height: 5\' 8"  (172.7 cm) Weight: 154 lb 5.2 oz (70 kg) IBW/kg (Calculated) : 68.4 Heparin Dosing Weight: 64 kg   Vital Signs: Temp: 97.3 F (36.3 C) (09/08 1600) Temp Source: Axillary (09/08 1600) BP: 88/58 (09/08 1600) Pulse Rate: 66 (09/08 1600)  Labs:  Recent Labs  11/09/2015 1011 10/29/2015 1717 11/09/2015 2218 11/02/15 0543  HGB 8.6*  --   --  8.0*  HCT 25.5*  --   --  23.6*  PLT 87*  --   --  74*  CREATININE 4.42*  --   --  4.22*  TROPONINI 0.16* 0.14* 0.13* 0.20*    Estimated Creatinine Clearance: 14.6 mL/min (by C-G formula based on SCr of 4.22 mg/dL).   Medical History: Past Medical History:  Diagnosis Date  . Anginal pain ([place])    had angina 2-3 mths prior to visit  . Arthritis   . CHF (congestive heart failure) ([place])   . Chronic kidney disease    follwed by Nephrologist Dr Juanito Doom in [place], Stage 3  . Coronary artery disease   . DDD (degenerative disc disease), cervical   . Diabetes mellitus without complication (HCC)    DIET CONTROLLED   . Frequent urination   . GERD (gastroesophageal reflux disease)   . Gout   . Hyperlipidemia   . Hypertension   . MI (myocardial infarction) ([place])   . Pancytopenia ([place])   . Shortness of breath dyspnea    at rest, mainly when he's active  . Stroke [facility])    ?? small stroke om 2017  came and went    Medications:  Scheduled:  . aspirin EC  81 mg Oral Daily  . atorvastatin  10 mg Oral q1800  . [START ON 11/03/2015] famotidine (PEPCID) IV  20 mg Intravenous Q24H  . fentaNYL (SUBLIMAZE) injection  50 mcg Intravenous Once  . isosorbide mononitrate  240 mg Oral Daily  . piperacillin-tazobactam (ZOSYN)  IV  3.375 g Intravenous Q12H  . sodium chloride flush  3 mL Intravenous Q12H  . sodium chloride flush  3  mL Intravenous Q12H  . tamsulosin  0.4 mg Oral Daily    Assessment: Pharmacy consulted to dose heparin in this 75 year old male with ACS/STEMI.    CrCl = 14.6 ml/min Pt was on heparin 5000 units SQ Q8H previously, last dose on 9/8 @ 1345.    Goal of Therapy:  Heparin level 0.3-0.7 units/ml Monitor platelets by anticoagulation protocol: Yes   Plan:  Will d/c heparin 5000 units SQ Q8H. Will start heparin gtt 800 units/hr , no bolus. Will draw 1st HL 8 hrs after start of drip.  Acelynn Dejonge D 11/02/2015,4:28 PM

## 2015-11-02 NOTE — Progress Notes (Signed)
* [facility]     Assessment and Plan:  Patient seen and examined, labs reviewed, discussed case with sister and wife at bedside, discussed with critical care RN and multidisciplinary team.  S/p cardiac arrest while on GMF, now with anoxic encephalopathy.   Review of CXR, small right pleural effusion vs. Atelectasis.  On exam the patient does grimace to pain, and has positive oculocephalic reflex.   D/w family, will monitor status over next 48 hours to assess neuro status, perform wake up assessment, continue to monitor vitals. Start tube feeding. Prognosis guarded.   Critical Care Attestation.  I have personally obtained a history, examined the patient, evaluated laboratory and imaging results, formulated the assessment and plan and placed orders. The Patient requires high complexity decision making for assessment and support, frequent evaluation and titration of therapies, application of advanced monitoring technologies and extensive interpretation of multiple databases. The patient has critical illness that could lead imminently to failure of 1 or more organ systems and requires the highest level of physician preparedness to intervene.  Critical Care Time devoted to patient care services described in this note is 55 minutes and is exclusive of time spent in procedures.      Date: 11/02/2015  MRN# [id number] Robert Parks Apr 12, 1940   Robert Parks is a 75 y.o. old male seen in follow up for chief complaint of  Chief Complaint  Patient presents with  . Extremity Weakness     Allergies:  No known allergies  Review of Systems: No able to provide due to critical illness.   Physical Examination:   VS: BP 91/62   Pulse 73   Temp 97.7 F (36.5 C) (Axillary)   Resp 20   Ht 5\' 8"  (1.727 m)   Wt 154 lb 5.2 oz (70 kg)   SpO2 100%   BMI 23.46 kg/m   General Appearance: unresponsive, grimaces.  Neuro:without focal findings, HEENT: PERRLA, EOM  intact. Pulmonary: normal breath sounds, No wheezing.   CardiovascularNormal S1,S2.  No m/r/g.   Abdomen: Benign, Soft, non-tender. Renal:  No costovertebral tenderness  GU:  Not performed at this time. Endoc: No evident thyromegaly, no signs of acromegaly. Skin:   warm, no rash. Extremities: normal, no cyanosis, clubbing.   LABORATORY PANEL:   CBC  Recent Labs Lab 11/02/15 0543  WBC 4.1  HGB 8.0*  HCT 23.6*  PLT 74*   ------------------------------------------------------------------------------------------------------------------  Chemistries   Recent Labs Lab 11/02/15 0543  NA 139  K 4.7  CL 106  CO2 20*  GLUCOSE 84  BUN 93*  CREATININE 4.22*  CALCIUM 8.6*  MG 2.1  AST 38  ALT 18  ALKPHOS 81  BILITOT 0.7   ------------------------------------------------------------------------------------------------------------------  Cardiac Enzymes  Recent Labs Lab 11/02/15 0543  TROPONINI 0.20*   ------------------------------------------------------------  RADIOLOGY:   No results found for this or any previous visit. Results for orders placed during the hospital encounter of 10/11/15  DG Chest 2 View   Narrative CLINICAL DATA:  Short of breath and leg swelling. Concern for heart failure or pneumonia  EXAM: CHEST  2 VIEW  COMPARISON:  None.  FINDINGS: Postop CABG. Cardiac enlargement. Pulmonary vascular congestion with mild interstitial edema. Small bilateral pleural effusions. Bibasilar atelectasis.  IMPRESSION: Congestive heart failure with mild edema and small bilateral pleural effusions.  Bibasilar atelectasis.   Electronically Signed   By: Franchot Gallo M.D.   On: 10/11/2015 16:06    ------------------------------------------------------------------------------------------------------------------  Thank  you for allowing [facility] to assist in the care of your patient. Our recommendations are noted above.   Please contact us if we can be of further service.   Marda Stalker, MD.  [facility] Office Number: [phone]  Patricia Pesa, M.D.  Vilinda Boehringer, M.D.  Merton Border, M.D  11/02/2015

## 2015-11-02 NOTE — Progress Notes (Signed)
Pharmacy Antibiotic Note  Robert Parks is a 75 y.o. male admitted on 11/08/2015 with UTI.  Pharmacy has been consulted for Zosyn dosing.  Plan: Zosyn 3.375 grams q 12 hours ordered.  Ceftriaxone d/c as duplicate therapy.  Height: 5\' 8"  (172.7 cm) Weight: 141 lb (64 kg) IBW/kg (Calculated) : 68.4  Temp (24hrs), Avg:98.4 F (36.9 C), Min:98 F (36.7 C), Max:98.8 F (37.1 C)   Recent Labs Lab 11/19/2015 1011 10/31/2015 1113 11/06/2015 1415  WBC 3.7*  --   --   CREATININE 4.42*  --   --   LATICACIDVEN  --  2.4* 1.7    Estimated Creatinine Clearance: 13.1 mL/min (by C-G formula based on SCr of 4.42 mg/dL).    Allergies  Allergen Reactions  . No Known Allergies Other (See Comments)    Antimicrobials this admission: ceftriaxone  >> Zosyn   >>   Dose adjustments this admission:   Microbiology results: 9/7 BCx: pending 9/7 UCx: pending    9/7 UA: LE(+) NO2(-) WBC TNTC  Thank you for allowing pharmacy to be a part of this patient's care.  Farzana Koci S 11/02/2015 1:34 AM

## 2015-11-02 NOTE — Progress Notes (Signed)
Initial Nutrition Assessment  DOCUMENTATION CODES:   Not applicable  INTERVENTION:  -Discussed nutritional poc with MD Ramachdran during ICU rounds; MD wanting to hold off on initiation of TF at present. Recommend initiation of Adult Tube Feeding Protocol if unable to extubate within 24-48 hours. Would recommend Vital High Protein at goal of 65 ml/hr providing 1560 kcals, 137 g protein, 1310 mL of free water. Continue to assess  NUTRITION DIAGNOSIS:   Inadequate oral intake related to acute illness, poor appetite as evidenced by NPO status, per patient/family report.  GOAL:   Patient will meet greater than or equal to 90% of their needs  MONITOR:   Vent status, Labs, Weight trends  REASON FOR ASSESSMENT:   Ventilator, Consult Poor PO  ASSESSMENT:    75 yo male admitted with sepsis due to UTI, acute on CRF, acute respiratory failure and cardiac arrest requiring intubation on 9/8  Pt currently NPO, OG to suction with bilious output, abdomen distended. Recorded po intake 50% of dinner last night.   Patient is currently intubated on ventilator support MV: 11.6 L/min Temp (24hrs), Avg:97.6 F (36.4 C), Min:97 F (36.1 C), Max:98 F (36.7 C)   Past Medical History:  Diagnosis Date  . Anginal pain ([place])    had angina 2-3 mths prior to visit  . Arthritis   . CHF (congestive heart failure) ([place])   . Chronic kidney disease    follwed by Nephrologist Dr Juanito Doom in [place], Stage 3  . Coronary artery disease   . DDD (degenerative disc disease), cervical   . Diabetes mellitus without complication ([facility])    DIET CONTROLLED   . Frequent urination   . GERD (gastroesophageal reflux disease)   . Gout   . Hyperlipidemia   . Hypertension   . MI (myocardial infarction) ([place])   . Pancytopenia ([place])   . Shortness of breath dyspnea    at rest, mainly when he's active  . Stroke [facility])    ?? small stroke om 2017  came and went     Diet Order:  Diet NPO time  specified  Skin:  Wound (see comment) (stage II pressure ulcer on buttock)  Last BM:  9/7  Labs: reviewed  Meds:  Fentanyl, versed  Height:   Ht Readings from Last 1 Encounters:  11/15/2015 5\' 8"  (1.727 m)    Weight:   Wt Readings from Last 1 Encounters:  11/02/15 154 lb 5.2 oz (70 kg)   Wt Readings from Last 10 Encounters:  11/02/15 154 lb 5.2 oz (70 kg)  10/31/15 146 lb (66.2 kg)  10/16/15 148 lb 12.8 oz (67.5 kg)  10/11/15 160 lb 7.9 oz (72.8 kg)  10/03/15 159 lb 5 oz (72.3 kg)  09/06/15 160 lb 6.2 oz (72.7 kg)  08/30/15 159 lb 6.3 oz (72.3 kg)  08/23/15 159 lb 6.3 oz (72.3 kg)  08/16/15 159 lb 15.1 oz (72.6 kg)  08/08/15 167 lb 12.3 oz (76.1 kg)    BMI:  Body mass index is 23.46 kg/m.  Estimated Nutritional Needs:   Kcal:  1625 kcals  Protein:  105-140 g   Fluid:  >/= 1.6 L  EDUCATION NEEDS:   No education needs identified at this time  [place], [place], [place] [phone] Pager  [phone] Weekend/On-Call Pager

## 2015-11-02 NOTE — H&P (Signed)
PULMONARY / CRITICAL CARE MEDICINE   Name: Robert Parks MRN: [id number] DOB: August 25, 1940    ADMISSION DATE:  11/11/2015   CHIEF COMPLAINT:  Acute cardiac arrest  SIGNIFICANT EVENTS: S/p cardiac arrest Asystole/PEA   HISTORY OF PRESENT ILLNESS:  75 y.o. 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 He is scheduled to urology clinic on 13th September. Since he went home he is feeling generalized weakness and not eating very well also. He is able to walk with a cane. Today morning he could not get up or walk felt generalized weakness so family decided to bring him to the hospital. In ER he was noted to be running hypotension and his lactic acid was high with the many WBCs in his urine.  Patient subsequently went unresponsive CODE BLUE CALLED patient with PEA and asystole for approx 10 mins, patient with cervical neck fractures, sacral decub ulcer,   Patient now intubated, on full vent support    PAST MEDICAL HISTORY :   has a past medical history of Anginal pain ([place]); Arthritis; CHF (congestive heart failure) ([place]); Chronic kidney disease; Coronary artery disease; DDD (degenerative disc disease), cervical; Diabetes mellitus without complication ([place]); Frequent urination; GERD (gastroesophageal reflux disease); Gout; Hyperlipidemia; Hypertension; MI (myocardial infarction) ([place]); Pancytopenia ([place]); Shortness of breath dyspnea; and Stroke ([place]).  has a past surgical history that includes Appendectomy; Colonoscopy with propofol (N/A, 08/21/2014); Back surgery (1980'S); Coronary artery bypass graft (1994); Eye surgery  (Left); and Cardiac catheterization. Prior to Admission medications   Medication Sig Start Date End Date Taking? Authorizing Provider  acetaminophen (TYLENOL) 650 MG CR tablet Take 650 mg by mouth every 8 (eight) hours as needed.    Yes Historical Provider, MD  aspirin EC 81 MG tablet Take 81 mg by mouth. 08/13/12  Yes Historical Provider, MD  atorvastatin (LIPITOR) 10 MG tablet Take 10 mg by mouth daily.  10/01/15  Yes Historical Provider, MD  carvedilol (COREG) 3.125 MG tablet Take 1 tablet (3.125 mg total) by mouth 2 (two) times daily with a meal. 10/16/15  Yes Sital Mody, MD  colchicine 0.6 MG tablet Take 0.3-0.6 mg by mouth See admin instructions. Take 0.8m once daily to prevent gout, take 1.234mat first sign of gout flare, may follow in 1 hour with 0.56m54mmax 3 tabs 11/11/11  Yes Historical Provider, MD  furosemide (LASIX) 80 MG tablet Take 1 tablet (80 mg total) by mouth 2 (two) times daily. 10/16/15  Yes SitBettey CostaD  hydrALAZINE (APRESOLINE) 100 MG tablet Take 100 mg by mouth 2 (two) times daily.   Yes Historical Provider, MD  isosorbide mononitrate (IMDUR) 120 MG 24 hr tablet Take 240 mg by mouth daily.  05/30/15  Yes Historical Provider, MD  nitroGLYCERIN (NITROSTAT) 0.4 MG SL tablet Place 0.4 mg under the tongue every 5 (five) minutes as needed.  11/27/14  Yes Historical Provider, MD  potassium chloride SA (K-DUR,KLOR-CON) 20 MEQ tablet Take 20 mEq by mouth daily.   Yes Historical Provider, MD  ranolazine (RANEXA) 500 MG 12 hr tablet Take 1 tablet (500 mg total) by mouth 2 (two) times daily. 10/16/15  Yes SitBettey CostaD  tamsulosin (FLOMAX) 0.4 MG  CAPS capsule Take 0.4 mg by mouth daily.  11/27/14  Yes Historical Provider, MD   Allergies  Allergen Reactions  . No Known Allergies Other (See Comments)    FAMILY HISTORY:  indicated that the status of his mother is unknown. He indicated that the status of his father is unknown.   SOCIAL HISTORY:  reports that he has quit smoking. His smoking  use included Cigarettes. He quit after 30.00 years of use. He has never used smokeless tobacco. He reports that he does not drink alcohol or use drugs.  REVIEW OF SYSTEMS:   UNOBTAINABLE DUE TO CRITICAL ILLNESS   VITAL SIGNS: Temp:  [98 F (36.7 C)-98.8 F (37.1 C)] 98 F (36.7 C) (09/07 2041) Pulse Rate:  [59-70] 67 (09/07 2041) Resp:  [14-30] 18 (09/07 2041) BP: (87-99)/(55-67) 99/67 (09/07 2041) SpO2:  [96 %-100 %] 100 % (09/07 2041) Weight:  [141 lb (64 kg)] 141 lb (64 kg) (09/07 1002) HEMODYNAMICS:     Intake/Output Summary (Last 24 hours) at 11/02/15 0113 Last data filed at 10/26/2015 2200  Gross per 24 hour  Intake              540 ml  Output               30 ml  Net              510 ml    PHYSICAL EXAMINATION: Physical Examination:   GENERAL:critically ill appearing, +resp distress HEAD: Normocephalic, atraumatic.  EYES: Pupils equal, round, reactive to light. Extraocular muscles intact. No scleral icterus.  MOUTH: Moist mucosal membrane. Dentition intact. No abscess noted.  EAR, NOSE, THROAT: Clear without exudates. No external lesions.  NECK: Supple. No thyromegaly. No nodules. No JVD. c collar in place PULMONARY: Diffuse coarse rhonchi right sided +wheezes +rhonchi CARDIOVASCULAR: S1 and S2. Regular rate and rhythm. No murmurs, rubs, or gallops. No edema. GASTROINTESTINAL: Soft, nontender, +distended. No masses. Positive bowel sounds. No hepatosplenomegaly.  MUSCULOSKELETAL: No swelling, clubbing, or edema. Range of motion full in all extremities.  NEUROLOGIC: GCS<8T SKIN:+sacral ulceration,       LABS:  CBC  Recent Labs Lab 11/19/2015 1011  WBC 3.7*  HGB 8.6*  HCT 25.5*  PLT 87*   Coag's No results for input(s): APTT, INR in the last 168 hours. BMET  Recent Labs Lab 11/08/2015 1011  NA 135  K 4.7  CL 100*  CO2 21*  BUN 94*  CREATININE 4.42*  GLUCOSE 111*   Electrolytes  Recent Labs Lab 11/15/2015 1011  CALCIUM 8.9   Sepsis  Markers  Recent Labs Lab 11/11/2015 1113 11/24/2015 1415  LATICACIDVEN 2.4* 1.7   ABG No results for input(s): PHART, PCO2ART, PO2ART in the last 168 hours. Liver Enzymes No results for input(s): AST, ALT, ALKPHOS, BILITOT, ALBUMIN in the last 168 hours. Cardiac Enzymes  Recent Labs Lab 11/14/2015 1011 11/24/2015 1717 11/04/2015 2218  TROPONINI 0.16* 0.14* 0.13*   Glucose  Recent Labs Lab 11/02/15 0058  GLUCAP 105*     ASSESSMENT / PLAN: 76 yo AAM with multiple medical issues admitted for sepsis from UTI with acidosis with acute cardiac arrest from acute CHF and acute NSTEMI, with multiorgan failure   PULMONARY -Respiratory Failure -continue Full MV support -continue Bronchodilator Therapy -Wean Fio2 and PEEP as tolerated -will perform SAT/SBt when respiratory parameters are met   CARDIOVASCULAR Will use vasopressors if needed  RENAL -Renal Failure-most likely due to ATN -follow chem 7 -follow UO -continue Foley Catheter-  GASTROINTESTINAL Place OGT  HEMATOLOGIC Follow CBC  INFECTIOUS empric abx with zosyn, await cultures  ENDOCRINE - ICU hypoglycemic\Hyperglycemia protocol   NEUROLOGIC - intubated and sedated - minimal sedation to achieve a RASS goal: -1    I have personally obtained a history, examined the patient, evaluated Pertinent laboratory and [employer]/imaging results, and  formulated the assessment and plan   The Patient requires high complexity decision making for assessment and support, frequent evaluation and titration of therapies, application of advanced monitoring technologies and extensive interpretation of multiple databases. Critical Care Time devoted to patient care services described in this note is 55 minutes.   Overall, patient is critically ill, prognosis is guarded.  Patient with Multiorgan failure and at high risk for cardiac arrest and death.   I recommend DNR status,awaiting for family to arrive   [name], M.D.  [facility]  Medical Director [place] Director [facility]-P[facility] Department

## 2015-11-02 NOTE — Plan of Care (Signed)
Cont to monitor pt, foley in place, ultrasound tech at bedside

## 2015-11-02 NOTE — Progress Notes (Signed)
Spoke to Dr. Ether Griffins on the on the phone. MD was requesting update on CT results and patient's condition. MD acknowledged results of CT with no new orders. RN and MD spoke about fluid order that had time out and been stopped as well. MD wanted fluids restarted back at 50 mL/hr of normal saline but wanted RN to check with nephrologist and make sure that was in line with his recommended plan of care. RN spoke in person with Dr. Holley Raring and he okayed fluids at the order of 50 mL/hr.

## 2015-11-02 NOTE — Progress Notes (Signed)
1700 pt. Was transported on the vent to CT for a neck CT and back to CCU.

## 2015-11-02 NOTE — Progress Notes (Signed)
Robert Parks is a 75 y.o. male  [id number]  Primary [place]  Reason for Consultation:CHF/arrythmia  HPI: 47 YOBM pressented with UTI, and later got intubated with respiratory failure/cardiac arrest. He apparently was SOB/orthopnea.   Review of Systems: no chest pains   Past Medical History:  Diagnosis Date  . Anginal pain ([place])    had angina 2-3 mths prior to visit  . Arthritis   . CHF (congestive heart failure) ([place])   . Chronic kidney disease    follwed by Nephrologist Dr Juanito Doom in [place], Stage 3  . Coronary artery disease   . DDD (degenerative disc disease), cervical   . Diabetes mellitus without complication (HCC)    DIET CONTROLLED   . Frequent urination   . GERD (gastroesophageal reflux disease)   . Gout   . Hyperlipidemia   . Hypertension   . MI (myocardial infarction) ([place])   . Pancytopenia ([place])   . Shortness of breath dyspnea    at rest, mainly when he's active  . Stroke [facility])    ?? small stroke om 2017  came and went    Medications Prior to Admission  Medication Sig Dispense Refill  . acetaminophen (TYLENOL) 650 MG CR tablet Take 650 mg by mouth every 8 (eight) hours as needed.     Marland Kitchen aspirin EC 81 MG tablet Take 81 mg by mouth.    Marland Kitchen atorvastatin (LIPITOR) 10 MG tablet Take 10 mg by mouth daily.     . carvedilol (COREG) 3.125 MG tablet Take 1 tablet (3.125 mg total) by mouth 2 (two) times daily with a meal. 60 tablet 0  . colchicine 0.6 MG tablet Take 0.3-0.6 mg by mouth See admin instructions. Take 0.3mg  once daily to prevent gout, take 1.2mg  at first sign of gout flare, may follow in 1 hour with 0.6mg , max 3 tabs    . furosemide (LASIX) 80 MG tablet Take 1 tablet (80 mg total) by mouth 2 (two) times daily. 60 tablet 0  . hydrALAZINE (APRESOLINE) 100 MG tablet Take 100 mg by mouth 2 (two) times daily.    . isosorbide mononitrate (IMDUR) 120 MG 24 hr tablet Take 240 mg by mouth daily.     . nitroGLYCERIN (NITROSTAT) 0.4 MG SL  tablet Place 0.4 mg under the tongue every 5 (five) minutes as needed.     . potassium chloride SA (K-DUR,KLOR-CON) 20 MEQ tablet Take 20 mEq by mouth daily.    . ranolazine (RANEXA) 500 MG 12 hr tablet Take 1 tablet (500 mg total) by mouth 2 (two) times daily. 60 tablet 0  . tamsulosin (FLOMAX) 0.4 MG CAPS capsule Take 0.4 mg by mouth daily.        Marland Kitchen aspirin EC  81 mg Oral Daily  . atorvastatin  10 mg Oral q1800  . [START ON 11/03/2015] famotidine (PEPCID) IV  20 mg Intravenous Q24H  . fentaNYL (SUBLIMAZE) injection  50 mcg Intravenous Once  . heparin  5,000 Units Subcutaneous Q8H  . isosorbide mononitrate  240 mg Oral Daily  . piperacillin-tazobactam (ZOSYN)  IV  3.375 g Intravenous Q12H  . sodium chloride flush  3 mL Intravenous Q12H  . sodium chloride flush  3 mL Intravenous Q12H  . tamsulosin  0.4 mg Oral Daily    Infusions: . fentaNYL infusion INTRAVENOUS 125 mcg/hr (11/02/15 1323)    Allergies  Allergen Reactions  . No Known Allergies Other (See Comments)    Social History   Social History  .  Marital status: Married    Spouse name: N/A  . Number of children: N/A  . Years of education: N/A   Occupational History  . Not on file.   Social History Main Topics  . Smoking status: Former Smoker    Years: 30.00    Types: Cigarettes  . Smokeless tobacco: Never Used     Comment: quit in his 70's  . Alcohol use No  . Drug use: No  . Sexual activity: Not on file   Other Topics Concern  . Not on file   Social History Narrative  . No narrative on file    Family History  Problem Relation Age of Onset  . Heart attack Mother   . Cancer Father     PHYSICAL EXAM: Vitals:   11/02/15 1241 11/02/15 1300  BP:  (!) 88/57  Pulse:  67  Resp:  20  Temp: 97 F (36.1 C)      Intake/Output Summary (Last 24 hours) at 11/02/15 1457 Last data filed at 11/02/15 1423  Gross per 24 hour  Intake          1904.59 ml  Output              419 ml  Net          1485.59 ml     General:  Well appearing. No respiratory difficulty HEENT: normal Neck: supple. no JVD. Carotids 2+ bilat; no bruits. No lymphadenopathy or thryomegaly appreciated. Cor: PMI nondisplaced. Regular rate & rhythm. No rubs, gallops or murmurs. Lungs: clear Abdomen: soft, nontender, nondistended. No hepatosplenomegaly. No bruits or masses. Good bowel sounds. Extremities: no cyanosis, clubbing, rash, edema Neuro: alert & oriented x 3, cranial nerves grossly intact. moves all 4 extremities w/o difficulty. Affect pleasant.  ECG: NSR LBBB, non-specificc st and t changes  Results for orders placed or performed during the hospital encounter of 11/02/2015 (from the past 24 hour(s))  Troponin I     Status: Abnormal   Collection Time: 11/18/2015  5:17 PM  Result Value Ref Range   Troponin I 0.14 (HH) <0.03 ng/mL  Troponin I     Status: Abnormal   Collection Time: 11/12/2015 10:18 PM  Result Value Ref Range   Troponin I 0.13 (HH) <0.03 ng/mL  Glucose, capillary     Status: Abnormal   Collection Time: 11/02/15 12:58 AM  Result Value Ref Range   Glucose-Capillary 105 (H) 65 - 99 mg/dL  Glucose, capillary     Status: Abnormal   Collection Time: 11/02/15  1:14 AM  Result Value Ref Range   Glucose-Capillary 111 (H) 65 - 99 mg/dL  Blood gas, arterial     Status: Abnormal   Collection Time: 11/02/15  1:30 AM  Result Value Ref Range   FIO2 0.40    Delivery systems VENTILATOR    pH, Arterial 7.21 (L) 7.350 - 7.450   pCO2 arterial 51 (H) 32.0 - 48.0 mmHg   pO2, Arterial 42 (L) 83.0 - 108.0 mmHg   Bicarbonate 20.4 20.0 - 28.0 mmol/L   Acid-base deficit 7.2 (H) 0.0 - 2.0 mmol/L   O2 Saturation 65.2 %   Patient temperature 37.0    Collection site RIGHT RADIAL    Sample type ARTERIAL DRAW    Allens test (pass/fail) PASS PASS  Culture, blood (routine x 2)     Status: None (Preliminary result)   Collection Time: 11/02/15  2:17 AM  Result Value Ref Range   Specimen Description BLOOD RIGHT HAND  Special Requests BOTTLES DRAWN AEROBIC AND ANAEROBIC 5CC    Culture NO GROWTH < 12 HOURS    Report Status PENDING   Lactic acid, plasma     Status: Abnormal   Collection Time: 11/02/15  2:28 AM  Result Value Ref Range   Lactic Acid, Venous 4.5 (HH) 0.5 - 1.9 mmol/L  Culture, blood (routine x 2)     Status: None (Preliminary result)   Collection Time: 11/02/15  2:28 AM  Result Value Ref Range   Specimen Description BLOOD LEFT HAND    Special Requests BOTTLES DRAWN AEROBIC AND ANAEROBIC [place]    Culture NO GROWTH < 12 HOURS    Report Status PENDING   Blood gas, arterial     Status: Abnormal   Collection Time: 11/02/15  5:00 AM  Result Value Ref Range   FIO2 0.40    Delivery systems VENTILATOR    pH, Arterial 7.45 7.350 - 7.450   pCO2 arterial 32 32.0 - 48.0 mmHg   pO2, Arterial 39 (LL) 83.0 - 108.0 mmHg   Bicarbonate 22.2 20.0 - 28.0 mmol/L   Acid-base deficit 1.0 0.0 - 2.0 mmol/L   O2 Saturation 76.6 %   Patient temperature 37.0    Collection site LEFT RADIAL    Sample type ARTERIAL DRAW    Allens test (pass/fail) PASS PASS  CBC     Status: Abnormal   Collection Time: 11/02/15  5:43 AM  Result Value Ref Range   WBC 4.1 3.8 - 10.6 K/uL   RBC 2.49 (L) 4.40 - 5.90 MIL/uL   Hemoglobin 8.0 (L) 13.0 - 18.0 g/dL   HCT 23.6 (L) 40.0 - 52.0 %   MCV 94.4 80.0 - 100.0 fL   MCH 31.9 26.0 - 34.0 pg   MCHC 33.8 32.0 - 36.0 g/dL   RDW 14.9 (H) 11.5 - 14.5 %   Platelets 74 (L) 150 - 440 K/uL  Troponin I     Status: Abnormal   Collection Time: 11/02/15  5:43 AM  Result Value Ref Range   Troponin I 0.20 (HH) <0.03 ng/mL  Comprehensive metabolic panel     Status: Abnormal   Collection Time: 11/02/15  5:43 AM  Result Value Ref Range   Sodium 139 135 - 145 mmol/L   Potassium 4.7 3.5 - 5.1 mmol/L   Chloride 106 101 - 111 mmol/L   CO2 20 (L) 22 - 32 mmol/L   Glucose, Bld 84 65 - 99 mg/dL   BUN 93 (H) 6 - 20 mg/dL   Creatinine, Ser 4.22 (H) 0.61 - 1.24 mg/dL   Calcium 8.6 (L) 8.9 - 10.3  mg/dL   Total Protein 5.3 (L) 6.5 - 8.1 g/dL   Albumin 3.0 (L) 3.5 - 5.0 g/dL   AST 38 15 - 41 U/L   ALT 18 17 - 63 U/L   Alkaline Phosphatase 81 38 - 126 U/L   Total Bilirubin 0.7 0.3 - 1.2 mg/dL   GFR calc non Af Amer 13 (L) >60 mL/min   GFR calc Af Amer 15 (L) >60 mL/min   Anion gap 13 5 - 15  Magnesium     Status: None   Collection Time: 11/02/15  5:43 AM  Result Value Ref Range   Magnesium 2.1 1.7 - 2.4 mg/dL  Phosphorus     Status: Abnormal   Collection Time: 11/02/15  5:43 AM  Result Value Ref Range   Phosphorus 5.1 (H) 2.5 - 4.6 mg/dL  Lactic acid, plasma  Status: Abnormal   Collection Time: 11/02/15  5:43 AM  Result Value Ref Range   Lactic Acid, Venous 2.7 (HH) 0.5 - 1.9 mmol/L  MRSA PCR Screening     Status: None   Collection Time: 11/02/15 10:42 AM  Result Value Ref Range   MRSA by PCR NEGATIVE NEGATIVE  Fibrin derivatives D-Dimer ([facility] only)     Status: Abnormal   Collection Time: 11/02/15  1:27 PM  Result Value Ref Range   Fibrin derivatives D-dimer ([facility]) 1,851 (H) 0 - 499   Dg Abd 1 View  Result Date: 11/02/2015 CLINICAL DATA:  Orogastric tube placement.  Initial encounter. EXAM: ABDOMEN - 1 VIEW COMPARISON:  None. FINDINGS: The patient's enteric tube is noted ending overlying the body of the stomach. The visualized bowel gas pattern is grossly unremarkable. No free intra-abdominal air is seen, though evaluation for free air is limited on a single supine view. A small right pleural effusion is noted. No acute osseous abnormalities are seen. IMPRESSION: 1. Enteric tube noted ending overlying the body of the stomach. 2. Small right pleural effusion noted. Electronically Signed   By: Garald Balding M.D.   On: 11/02/2015 03:05   Ct Head Wo Contrast  Result Date: 11/02/2015 CLINICAL DATA:  Status post cardiac arrest EXAM: CT HEAD WITHOUT CONTRAST TECHNIQUE: Contiguous axial images were obtained from the base of the skull through the vertex without intravenous  contrast. COMPARISON:  MRI brain dated 06/08/2015 FINDINGS: Brain: No evidence of acute infarction, hemorrhage, hydrocephalus, extra-axial collection or mass lesion/mass effect. Vascular: No hyperdense vessel or unexpected calcification. Skull: No evidence of calvarial fracture. Sinuses/Orbits: The visualized paranasal sinuses are essentially clear. The mastoid air cells are unopacified. Other: Subcortical white matter and periventricular small vessel ischemic changes. Intracranial atherosclerosis. Global cortical atrophy.  No ventriculomegaly. IMPRESSION: No evidence of acute intracranial abnormality. Atrophy with small vessel ischemic changes. Electronically Signed   By: Julian Hy M.D.   On: 11/02/2015 09:42   Dg Chest Port 1 View  Result Date: 11/02/2015 CLINICAL DATA:  75 year old male with endotracheal tube placement. EXAM: PORTABLE CHEST 1 VIEW COMPARISON:  Chest radiograph dated 10/11/2015 FINDINGS: An endotracheal tube with tip approximately 4.5 cm above the carina and an enteric tube coursing into the left hemi abdomen with tip beyond the inferior margin of the image noted. There is stable cardiomegaly with mild vascular congestion. There is a small right pleural effusion, new or increased from prior study. There is associated partial compressive atelectasis of the right lower lobe. Pneumonia is not excluded. Clinical correlation and follow-up recommended. The left lung is clear. There is no pneumothorax. Median sternotomy wires and CABG vascular clips noted. No acute osseous pathology. IMPRESSION: Endotracheal tube above the carina. Cardiomegaly with mild congestive changes and small right pleural effusion. Superimposed pneumonia is not excluded. Clinical correlation and follow-up recommended. Electronically Signed   By: Anner Crete M.D.   On: 11/02/2015 03:01     ASSESSMENT AND Plan, CHF with LVEF 30%, presented with UTI and had cardiopulmonary arrest, and intubated. Has renal failure,  and in NSR right now, will follow closely, and maybe cardiac event has h/o of CAD. But unable to do invasive workup due to his condition and renal failure.  Korryn Pancoast A

## 2015-11-02 NOTE — Progress Notes (Signed)
SLP Cancellation Note  Patient Details Name: Robert Parks MRN: [id number] DOB: 1940-06-18   Cancelled treatment:       Reason Eval/Treat Not Completed: Medical issues which prohibited therapy;Patient not medically ready. Code Blue was initiated last evening, and pt was transferred to CCU and is now orally intubated w/ TFs initiated. ST services will f/u when pt is medically ready. NSG agreed.   Myer Bohlman 11/02/2015, 10:33 AM

## 2015-11-03 ENCOUNTER — Inpatient Hospital Stay: Payer: Medicare HMO

## 2015-11-03 DIAGNOSIS — N390 Urinary tract infection, site not specified: Secondary | ICD-10-CM

## 2015-11-03 DIAGNOSIS — N179 Acute kidney failure, unspecified: Secondary | ICD-10-CM

## 2015-11-03 DIAGNOSIS — J9601 Acute respiratory failure with hypoxia: Secondary | ICD-10-CM

## 2015-11-03 DIAGNOSIS — M4802 Spinal stenosis, cervical region: Secondary | ICD-10-CM

## 2015-11-03 LAB — COMPREHENSIVE METABOLIC PANEL
ALBUMIN: 2.8 g/dL — AB (ref 3.5–5.0)
ALT: 20 U/L (ref 17–63)
AST: 48 U/L — AB (ref 15–41)
Alkaline Phosphatase: 72 U/L (ref 38–126)
Anion gap: 12 (ref 5–15)
BUN: 91 mg/dL — ABNORMAL HIGH (ref 6–20)
CALCIUM: 8.2 mg/dL — AB (ref 8.9–10.3)
CO2: 19 mmol/L — AB (ref 22–32)
CREATININE: 4.46 mg/dL — AB (ref 0.61–1.24)
Chloride: 107 mmol/L (ref 101–111)
GFR, EST AFRICAN AMERICAN: 14 mL/min — AB (ref >60)
GFR, EST NON AFRICAN AMERICAN: 12 mL/min — AB (ref >60)
Glucose, Bld: 81 mg/dL (ref 65–99)
Potassium: 4.1 mmol/L (ref 3.5–5.1)
SODIUM: 138 mmol/L (ref 135–145)
Total Bilirubin: 1 mg/dL (ref 0.3–1.2)
Total Protein: 5.2 g/dL — ABNORMAL LOW (ref 6.5–8.1)

## 2015-11-03 LAB — RENAL FUNCTION PANEL
ALBUMIN: 2.7 g/dL — AB (ref 3.5–5.0)
ANION GAP: 9 (ref 5–15)
ANION GAP: 7 (ref 5–15)
ANION GAP: 8 (ref 5–15)
Albumin: 2.8 g/dL — ABNORMAL LOW (ref 3.5–5.0)
Albumin: 2.7 g/dL — ABNORMAL LOW (ref 3.5–5.0)
Albumin: 2.7 g/dL — ABNORMAL LOW (ref 3.5–5.0)
Anion gap: 10 (ref 5–15)
BUN: 95 mg/dL — AB (ref 6–20)
BUN: 88 mg/dL — AB (ref 6–20)
BUN: 67 mg/dL — ABNORMAL HIGH (ref 6–20)
BUN: 55 mg/dL — AB (ref 6–20)
CALCIUM: 8.2 mg/dL — AB (ref 8.9–10.3)
CALCIUM: 8.3 mg/dL — AB (ref 8.9–10.3)
CHLORIDE: 108 mmol/L (ref 101–111)
CHLORIDE: 109 mmol/L (ref 101–111)
CHLORIDE: 107 mmol/L (ref 101–111)
CO2: 21 mmol/L — AB (ref 22–32)
CO2: 20 mmol/L — AB (ref 22–32)
CO2: 24 mmol/L (ref 22–32)
CO2: 24 mmol/L (ref 22–32)
CREATININE: 4.5 mg/dL — AB (ref 0.61–1.24)
CREATININE: 3.36 mg/dL — AB (ref 0.61–1.24)
CREATININE: 2.64 mg/dL — AB (ref 0.61–1.24)
Calcium: 8 mg/dL — ABNORMAL LOW (ref 8.9–10.3)
Calcium: 8.1 mg/dL — ABNORMAL LOW (ref 8.9–10.3)
Chloride: 107 mmol/L (ref 101–111)
Creatinine, Ser: 4.26 mg/dL — ABNORMAL HIGH (ref 0.61–1.24)
GFR calc Af Amer: 14 mL/min — ABNORMAL LOW (ref >60)
GFR calc non Af Amer: 12 mL/min — ABNORMAL LOW (ref >60)
GFR calc non Af Amer: 12 mL/min — ABNORMAL LOW (ref >60)
GFR calc non Af Amer: 22 mL/min — ABNORMAL LOW (ref >60)
GFR, EST AFRICAN AMERICAN: 13 mL/min — AB (ref >60)
GFR, EST AFRICAN AMERICAN: 19 mL/min — AB (ref >60)
GFR, EST AFRICAN AMERICAN: 26 mL/min — AB (ref >60)
GFR, EST NON AFRICAN AMERICAN: 17 mL/min — AB (ref >60)
GLUCOSE: 114 mg/dL — AB (ref 65–99)
GLUCOSE: 119 mg/dL — AB (ref 65–99)
GLUCOSE: 109 mg/dL — AB (ref 65–99)
Glucose, Bld: 130 mg/dL — ABNORMAL HIGH (ref 65–99)
PHOSPHORUS: 3.4 mg/dL (ref 2.5–4.6)
PHOSPHORUS: 2.7 mg/dL (ref 2.5–4.6)
POTASSIUM: 3.5 mmol/L (ref 3.5–5.1)
POTASSIUM: 3.7 mmol/L (ref 3.5–5.1)
POTASSIUM: 3.8 mmol/L (ref 3.5–5.1)
Phosphorus: 3.9 mg/dL (ref 2.5–4.6)
Phosphorus: 3.1 mg/dL (ref 2.5–4.6)
Potassium: 3.5 mmol/L (ref 3.5–5.1)
SODIUM: 139 mmol/L (ref 135–145)
Sodium: 138 mmol/L (ref 135–145)
Sodium: 138 mmol/L (ref 135–145)
Sodium: 139 mmol/L (ref 135–145)

## 2015-11-03 LAB — BLOOD GAS, ARTERIAL
ACID-BASE DEFICIT: 2.1 mmol/L — AB (ref 0.0–2.0)
Bicarbonate: 19.7 mmol/L — ABNORMAL LOW (ref 20.0–28.0)
O2 SAT: 99.6 %
PATIENT TEMPERATURE: 37
PO2 ART: 166 mmHg — AB (ref 83.0–108.0)
pCO2 arterial: 23 mmHg — ABNORMAL LOW (ref 32.0–48.0)
pH, Arterial: 7.54 — ABNORMAL HIGH (ref 7.350–7.450)

## 2015-11-03 LAB — URINE CULTURE

## 2015-11-03 LAB — CBC
HCT: 23.2 % — ABNORMAL LOW (ref 40.0–52.0)
Hemoglobin: 7.9 g/dL — ABNORMAL LOW (ref 13.0–18.0)
MCH: 31.8 pg (ref 26.0–34.0)
MCHC: 34.3 g/dL (ref 32.0–36.0)
MCV: 92.6 fL (ref 80.0–100.0)
PLATELETS: 80 10*3/uL — AB (ref 150–440)
RBC: 2.5 MIL/uL — ABNORMAL LOW (ref 4.40–5.90)
RDW: 15 % — AB (ref 11.5–14.5)
WBC: 4.4 10*3/uL (ref 3.8–10.6)

## 2015-11-03 LAB — GLUCOSE, CAPILLARY
GLUCOSE-CAPILLARY: 104 mg/dL — AB (ref 65–99)
GLUCOSE-CAPILLARY: 107 mg/dL — AB (ref 65–99)
GLUCOSE-CAPILLARY: 95 mg/dL (ref 65–99)
Glucose-Capillary: 99 mg/dL (ref 65–99)
Glucose-Capillary: 118 mg/dL — ABNORMAL HIGH (ref 65–99)
Glucose-Capillary: 114 mg/dL — ABNORMAL HIGH (ref 65–99)

## 2015-11-03 LAB — HEPARIN LEVEL (UNFRACTIONATED)
HEPARIN UNFRACTIONATED: 0.18 [IU]/mL — AB (ref 0.30–0.70)
HEPARIN UNFRACTIONATED: 0.22 [IU]/mL — AB (ref 0.30–0.70)
HEPARIN UNFRACTIONATED: 0.41 [IU]/mL (ref 0.30–0.70)

## 2015-11-03 LAB — PHOSPHORUS
Phosphorus: 4 mg/dL (ref 2.5–4.6)
Phosphorus: 3.2 mg/dL (ref 2.5–4.6)

## 2015-11-03 LAB — MAGNESIUM
MAGNESIUM: 2 mg/dL (ref 1.7–2.4)
Magnesium: 2 mg/dL (ref 1.7–2.4)

## 2015-11-03 MED ORDER — HEPARIN BOLUS VIA INFUSION
900.0000 [IU] | Freq: Once | INTRAVENOUS | Status: AC
  Administered 2015-11-03: 900 [IU] via INTRAVENOUS
  Filled 2015-11-03: qty 900

## 2015-11-03 MED ORDER — ORAL CARE MOUTH RINSE
15.0000 mL | OROMUCOSAL | Status: DC
  Administered 2015-11-03 – 2015-11-08 (×56): 15 mL via OROMUCOSAL

## 2015-11-03 MED ORDER — FENTANYL BOLUS VIA INFUSION: 50.0000 ug | Freq: Once | INTRAVENOUS | Status: DC

## 2015-11-03 MED ORDER — ORAL CARE MOUTH RINSE
15.0000 mL | OROMUCOSAL | Status: DC
  Administered 2015-11-03 (×9): 15 mL via OROMUCOSAL

## 2015-11-03 MED ORDER — NOREPINEPHRINE BITARTRATE 1 MG/ML IV SOLN
0.0000 ug/min | INTRAVENOUS | Status: DC
  Administered 2015-11-03: 6 ug/min via INTRAVENOUS
  Administered 2015-11-04: 10 ug/min via INTRAVENOUS
  Administered 2015-11-05: 15 ug/min via INTRAVENOUS
  Administered 2015-11-06: 10 ug/min via INTRAVENOUS
  Administered 2015-11-07: 2 ug/min via INTRAVENOUS
  Filled 2015-11-03 (×6): qty 16

## 2015-11-03 MED ORDER — SODIUM CHLORIDE 0.9 % IV BOLUS (SEPSIS)
500.0000 mL | Freq: Once | INTRAVENOUS | Status: AC
  Administered 2015-11-03: 500 mL via INTRAVENOUS

## 2015-11-03 MED ORDER — PIPERACILLIN-TAZOBACTAM 3.375 G IVPB
3.3750 g | Freq: Three times a day (TID) | INTRAVENOUS | Status: AC
  Administered 2015-11-03 – 2015-11-08 (×17): 3.375 g via INTRAVENOUS
  Filled 2015-11-03 (×17): qty 50

## 2015-11-03 MED ORDER — CHLORHEXIDINE GLUCONATE 0.12 % MT SOLN
15.0000 mL | Freq: Two times a day (BID) | OROMUCOSAL | Status: DC
  Administered 2015-11-03 – 2015-11-08 (×12): 15 mL via OROMUCOSAL
  Filled 2015-11-03 (×4): qty 15

## 2015-11-03 MED ORDER — ASPIRIN 81 MG PO CHEW
81.0000 mg | CHEWABLE_TABLET | Freq: Every day | ORAL | Status: DC
  Administered 2015-11-03 – 2015-11-07 (×5): 81 mg via ORAL
  Filled 2015-11-03 (×5): qty 1

## 2015-11-03 MED ORDER — SENNOSIDES 8.8 MG/5ML PO SYRP
10.0000 mL | ORAL_SOLUTION | Freq: Two times a day (BID) | ORAL | Status: DC
  Administered 2015-11-03 – 2015-11-06 (×7): 10 mL via ORAL
  Filled 2015-11-03 (×7): qty 10

## 2015-11-03 MED ORDER — POLYETHYLENE GLYCOL 3350 17 G PO PACK
17.0000 g | PACK | Freq: Two times a day (BID) | ORAL | Status: DC
  Administered 2015-11-03 – 2015-11-06 (×7): 17 g via ORAL
  Filled 2015-11-03 (×7): qty 1

## 2015-11-03 MED ORDER — HEPARIN SODIUM (PORCINE) 1000 UNIT/ML DIALYSIS: 1000.0000 [IU] | INTRAMUSCULAR | Status: DC | PRN

## 2015-11-03 MED ORDER — METOCLOPRAMIDE HCL 5 MG/ML IJ SOLN
20.0000 mg | Freq: Three times a day (TID) | INTRAVENOUS | Status: DC
  Administered 2015-11-03 – 2015-11-05 (×6): 20 mg via INTRAVENOUS
  Filled 2015-11-03 (×10): qty 4

## 2015-11-03 MED ORDER — "THROMBI-PAD 3""X3"" EX PADS"
1.0000 | MEDICATED_PAD | Freq: Once | CUTANEOUS | Status: AC
  Administered 2015-11-03: 1 via TOPICAL
  Filled 2015-11-03: qty 1

## 2015-11-03 MED ORDER — HEPARIN BOLUS VIA INFUSION
1900.0000 [IU] | Freq: Once | INTRAVENOUS | Status: AC
  Administered 2015-11-03: 1900 [IU] via INTRAVENOUS
  Filled 2015-11-03: qty 1900

## 2015-11-03 MED ORDER — PUREFLOW DIALYSIS SOLUTION
INTRAVENOUS | Status: DC
  Administered 2015-11-03 – 2015-11-04 (×4): 3 via INTRAVENOUS_CENTRAL
  Administered 2015-11-04: 12:00:00 via INTRAVENOUS_CENTRAL
  Administered 2015-11-04 – 2015-11-08 (×14): 3 via INTRAVENOUS_CENTRAL

## 2015-11-03 NOTE — Progress Notes (Signed)
[facility]  ROUNDING NOTE   Subjective:  Pt remains critically ill. UOP was 721cc over the preceeding 24 hours.  Serum bicarb low at 19.   Remains on the ventilator, fio2 24%.     Objective:  Vital signs in last 24 hours:  Temp:  [97 F (36.1 C)-99.1 F (37.3 C)] 99.1 F (37.3 C) (09/09 0800) Pulse Rate:  [36-99] 75 (09/09 0830) Resp:  [14-20] 15 (09/09 0830) BP: (76-109)/(52-77) 83/53 (09/09 0830) SpO2:  [100 %] 100 % (09/09 0830) FiO2 (%):  [24 %-35 %] 24 % (09/09 0752) Weight:  [74.1 kg (163 lb 5.8 oz)] 74.1 kg (163 lb 5.8 oz) (09/09 0500)  Weight change: 10.1 kg (22 lb 5.8 oz) Filed Weights   10/29/2015 1002 11/02/15 0500 11/03/15 0500  Weight: 64 kg (141 lb) 70 kg (154 lb 5.2 oz) 74.1 kg (163 lb 5.8 oz)    Intake/Output: I/O last 3 completed shifts: In: 4621.6 [I.V.:2546.9; NG/GT:374.7; IV Piggyback:1700] Out: 721 [Urine:721]   Intake/Output this shift:  Total I/O In: 131.3 [I.V.:91.3; NG/GT:40] Out: -   Physical Exam: General: Critically ill appearing  Head: ETT/OG in place  Eyes: Anicteric  Neck: Supple, trachea midline  Lungs:  Bilateral rhonchi, normal effort  Heart: S1S2 tachycardic  Abdomen:  Soft, nontender, BS present  Extremities: trace peripheral edema.  Neurologic: Not following commands  Skin: No lesions  Access:     Basic Metabolic Panel:  Recent Labs Lab 10/26/2015 1011 11/02/15 0543 11/02/15 1714 11/03/15 0147  NA 135 139  --  138  K 4.7 4.7  --  4.1  CL 100* 106  --  107  CO2 21* 20*  --  19*  GLUCOSE 111* 84  --  81  BUN 94* 93*  --  91*  CREATININE 4.42* 4.22*  --  4.46*  CALCIUM 8.9 8.6*  --  8.2*  MG  --  2.1 2.0 2.0  PHOS  --  5.1* 4.3 4.0    Liver Function Tests:  Recent Labs Lab 11/02/15 0543 11/03/15 0147  AST 38 48*  ALT 18 20  ALKPHOS 81 72  BILITOT 0.7 1.0  PROT 5.3* 5.2*  ALBUMIN 3.0* 2.8*   No results for input(s): LIPASE, AMYLASE in the last 168 hours. No results for input(s): AMMONIA  in the last 168 hours.  CBC:  Recent Labs Lab 11/16/2015 1011 11/02/15 0543 11/03/15 0147  WBC 3.7* 4.1 4.4  HGB 8.6* 8.0* 7.9*  HCT 25.5* 23.6* 23.2*  MCV 93.9 94.4 92.6  PLT 87* 74* 80*    Cardiac Enzymes:  Recent Labs Lab 10/31/2015 1011 10/30/2015 1717 11/02/2015 2218 11/02/15 0543  TROPONINI 0.16* 0.14* 0.13* 0.20*    BNP: Invalid input(s): POCBNP  CBG:  Recent Labs Lab 11/02/15 1927 11/02/15 1959 11/02/15 2356 11/03/15 0334 11/03/15 0717  GLUCAP 60* 158* 75 95 99    Microbiology: Results for orders placed or performed during the hospital encounter of 11/15/2015  Urine culture     Status: Abnormal   Collection Time: 10/30/2015 10:11 AM  Result Value Ref Range Status   Specimen Description URINE, RANDOM  Final   Special Requests NONE  Final   Culture >=100,000 COLONIES/mL KLEBSIELLA PNEUMONIAE (A)  Final   Report Status 11/03/2015 FINAL  Final   Organism ID, Bacteria KLEBSIELLA PNEUMONIAE (A)  Final      Susceptibility   Klebsiella pneumoniae - MIC*    AMPICILLIN >=32 RESISTANT Resistant     CEFAZOLIN <=4 SENSITIVE Sensitive  CEFTRIAXONE <=1 SENSITIVE Sensitive     CIPROFLOXACIN <=0.25 SENSITIVE Sensitive     GENTAMICIN <=1 SENSITIVE Sensitive     IMIPENEM <=0.25 SENSITIVE Sensitive     NITROFURANTOIN <=16 SENSITIVE Sensitive     TRIMETH/SULFA <=20 SENSITIVE Sensitive     AMPICILLIN/SULBACTAM 4 SENSITIVE Sensitive     PIP/TAZO <=4 SENSITIVE Sensitive     Extended ESBL NEGATIVE Sensitive     * >=100,000 COLONIES/mL KLEBSIELLA PNEUMONIAE  Culture, blood (routine x 2)     Status: None (Preliminary result)   Collection Time: 11/23/2015 11:13 AM  Result Value Ref Range Status   Specimen Description BLOOD RIGHT AC  Final   Special Requests   Final    BOTTLES DRAWN AEROBIC AND ANAEROBIC ANA 8ML AER 10ML   Culture NO GROWTH < 24 HOURS  Final   Report Status PENDING  Incomplete  Culture, blood (routine x 2)     Status: None (Preliminary result)   Collection  Time: 10/31/2015 11:13 AM  Result Value Ref Range Status   Specimen Description BLOOD LEFT ARM  Final   Special Requests BOTTLES DRAWN AEROBIC AND ANAEROBIC [place]  Final   Culture NO GROWTH < 24 HOURS  Final   Report Status PENDING  Incomplete  Culture, blood (routine x 2)     Status: None (Preliminary result)   Collection Time: 11/02/15  2:17 AM  Result Value Ref Range Status   Specimen Description BLOOD RIGHT HAND  Final   Special Requests BOTTLES DRAWN AEROBIC AND ANAEROBIC 5CC  Final   Culture NO GROWTH < 12 HOURS  Final   Report Status PENDING  Incomplete  Culture, blood (routine x 2)     Status: None (Preliminary result)   Collection Time: 11/02/15  2:28 AM  Result Value Ref Range Status   Specimen Description BLOOD LEFT HAND  Final   Special Requests BOTTLES DRAWN AEROBIC AND ANAEROBIC [place]  Final   Culture NO GROWTH < 12 HOURS  Final   Report Status PENDING  Incomplete  MRSA PCR Screening     Status: None   Collection Time: 11/02/15 10:42 AM  Result Value Ref Range Status   MRSA by PCR NEGATIVE NEGATIVE Final    Comment:        The GeneXpert MRSA Assay (FDA approved for NASAL specimens only), is one component of a comprehensive MRSA colonization surveillance program. It is not intended to diagnose MRSA infection nor to guide or monitor treatment for MRSA infections.     Coagulation Studies: No results for input(s): LABPROT, INR in the last 72 hours.  Urinalysis:  Recent Labs  11/05/2015 1011 11/05/2015 1415  COLORURINE AMBER* AMBER*  LABSPEC 1.014 1.012  PHURINE 7.0 5.0  GLUCOSEU NEGATIVE NEGATIVE  HGBUR 1+* 3+*  BILIRUBINUR NEGATIVE NEGATIVE  KETONESUR NEGATIVE NEGATIVE  PROTEINUR 100* 100*  NITRITE NEGATIVE NEGATIVE  LEUKOCYTESUR 3+* 3+*      Imaging: Dg Chest 1 View  Result Date: 11/03/2015 CLINICAL DATA:  Patient with history of shortness of breath and abdominal distension. EXAM: CHEST 1 VIEW COMPARISON:  Chest radiograph 11/02/2015. FINDINGS: Patient  is rotated to the left. ET tube terminates in the mid trachea. Enteric tube courses inferior to the diaphragm. Pacer apparatus overlies the mediastinum. Monitoring leads overlie the patient. Stable cardiomegaly. Small bilateral layering pleural effusions, right-greater-than-left. Underlying pulmonary opacities. No pneumothorax. IMPRESSION: ETT terminates in the mid trachea. Cardiomegaly. Right-greater-than-left layering pleural effusions with underlying opacities. Electronically Signed   By: Polly Cobia.D.  On: 11/03/2015 07:27   Dg Abd 1 View  Result Date: 11/03/2015 CLINICAL DATA:  Patient with shortness of breath and abdominal distension. History of stroke. EXAM: ABDOMEN - 1 VIEW COMPARISON:  Abdominal radiograph 11/02/2015 FINDINGS: ET tube tip and side-port project over the stomach. Pacer apparatus overlies the mediastinum. Multiple monitoring leads overlie the patient. Nonobstructed bowel gas pattern. Small to moderate right pleural effusion. Underlying pulmonary opacities. IMPRESSION: ET tube tip and side-port project in the stomach. Nonobstructed bowel gas pattern. Electronically Signed   By: Lovey Newcomer M.D.   On: 11/03/2015 07:41   Dg Abd 1 View  Result Date: 11/02/2015 CLINICAL DATA:  Orogastric tube placement.  Initial encounter. EXAM: ABDOMEN - 1 VIEW COMPARISON:  None. FINDINGS: The patient's enteric tube is noted ending overlying the body of the stomach. The visualized bowel gas pattern is grossly unremarkable. No free intra-abdominal air is seen, though evaluation for free air is limited on a single supine view. A small right pleural effusion is noted. No acute osseous abnormalities are seen. IMPRESSION: 1. Enteric tube noted ending overlying the body of the stomach. 2. Small right pleural effusion noted. Electronically Signed   By: Garald Balding M.D.   On: 11/02/2015 03:05   Ct Head Wo Contrast  Result Date: 11/02/2015 CLINICAL DATA:  Status post cardiac arrest EXAM: CT HEAD WITHOUT  CONTRAST TECHNIQUE: Contiguous axial images were obtained from the base of the skull through the vertex without intravenous contrast. COMPARISON:  MRI brain dated 06/08/2015 FINDINGS: Brain: No evidence of acute infarction, hemorrhage, hydrocephalus, extra-axial collection or mass lesion/mass effect. Vascular: No hyperdense vessel or unexpected calcification. Skull: No evidence of calvarial fracture. Sinuses/Orbits: The visualized paranasal sinuses are essentially clear. The mastoid air cells are unopacified. Other: Subcortical white matter and periventricular small vessel ischemic changes. Intracranial atherosclerosis. Global cortical atrophy.  No ventriculomegaly. IMPRESSION: No evidence of acute intracranial abnormality. Atrophy with small vessel ischemic changes. Electronically Signed   By: Julian Hy M.D.   On: 11/02/2015 09:42   Ct Cervical Spine Wo Contrast  Result Date: 11/02/2015 CLINICAL DATA:  Cardiac arrest, a systole for about 10 minutes. Neck pain. Intubated. EXAM: CT CERVICAL SPINE WITHOUT CONTRAST TECHNIQUE: Multidetector CT imaging of the cervical spine was performed without intravenous contrast. Multiplanar CT image reconstructions were also generated. COMPARISON:  Multiple exams, including 06/08/2015 MRI FINDINGS: Alignment: 1.5 mm degenerative anterolisthesis at C4-5. Skull base and vertebrae: Anterior spurring and loss of articular space at the C1-2 articulation. Multilevel degenerative facet arthropathy and anterior intervertebral spurring noted, with some completely bridging spurring on the right at C5-6. Loss of disc height at C5-6 with posterior osseous ridging. Several lucencies are present scattered in the vertebral bodies, possibly small hemangiomas but technically nonspecific. Soft tissues and spinal canal: Atherosclerotic calcification of the carotid bulbs bilaterally. Disc protrusion at C4-5 and disc bulge at C5-6. Disc levels: Cervical spondylosis causes osseous foraminal  narrowing on the left at C3-4, C4-5, and C5-6; and on the right at C3-4, C4-5, and C5-6. Underlying degenerative disc disease likely contributes to the foraminal impingement at the C4-5 and C5-6 levels. Degenerative disc disease is believed to be causing at least moderate central narrowing of the thecal sac at C4-5 and C5-6. Upper chest: Right pleural effusion. Other: Generalized subcutaneous edema. IMPRESSION: 1. No cervical spine fracture or acute subluxation. 2. Cervical spondylosis and degenerative disc disease, causing impingement at C3-4, C4-5, and C5-6. 3. Right pleural effusion. 4. Subcutaneous edema. 5. Atherosclerotic calcification of the carotid bulbs  bilaterally. Electronically Signed   By: Van Clines M.D.   On: 11/02/2015 17:10   US Venous Img Lower Bilateral  Result Date: 11/02/2015 CLINICAL DATA:  Bilateral lower extremity swelling. Short of breath for 1 day. EXAM: BILATERAL LOWER EXTREMITY VENOUS DUPLEX ULTRASOUND TECHNIQUE: Doppler venous assessment of the bilateral lower extremity deep venous system was performed, including characterization of spectral flow, compressibility, and phasicity. COMPARISON:  None. FINDINGS: There is complete compressibility of the bilateral common femoral, femoral, and popliteal veins. Doppler analysis demonstrates respiratory phasicity and augmentation of flow with calf compression. No obvious superficial vein or calf vein thrombosis. IMPRESSION: No evidence of lower extremity DVT. Electronically Signed   By: Marybelle Killings M.D.   On: 11/02/2015 15:58   Dg Chest Port 1 View  Result Date: 11/02/2015 CLINICAL DATA:  75 year old male with endotracheal tube placement. EXAM: PORTABLE CHEST 1 VIEW COMPARISON:  Chest radiograph dated 10/11/2015 FINDINGS: An endotracheal tube with tip approximately 4.5 cm above the carina and an enteric tube coursing into the left hemi abdomen with tip beyond the inferior margin of the image noted. There is stable cardiomegaly with  mild vascular congestion. There is a small right pleural effusion, new or increased from prior study. There is associated partial compressive atelectasis of the right lower lobe. Pneumonia is not excluded. Clinical correlation and follow-up recommended. The left lung is clear. There is no pneumothorax. Median sternotomy wires and CABG vascular clips noted. No acute osseous pathology. IMPRESSION: Endotracheal tube above the carina. Cardiomegaly with mild congestive changes and small right pleural effusion. Superimposed pneumonia is not excluded. Clinical correlation and follow-up recommended. Electronically Signed   By: Anner Crete M.D.   On: 11/02/2015 03:01     Medications:   . sodium chloride 50 mL/hr at 11/03/15 0600  . fentaNYL infusion INTRAVENOUS 125 mcg/hr (11/03/15 0600)  . heparin 1,000 Units/hr (11/03/15 0600)  . norepinephrine 6 mcg/min (11/03/15 6433)   . aspirin  81 mg Oral Daily  . atorvastatin  10 mg Oral q1800  . chlorhexidine  15 mL Mouth Rinse BID  . famotidine (PEPCID) IV  20 mg Intravenous Q24H  . feeding supplement (PRO-STAT SUGAR FREE 64)  30 mL Per Tube BID  . feeding supplement (VITAL HIGH PROTEIN)  1,000 mL Per Tube Q24H  . fentaNYL (SUBLIMAZE) injection  50 mcg Intravenous Once  . isosorbide mononitrate  240 mg Oral Daily  . mouth rinse  15 mL Mouth Rinse Q2H  . piperacillin-tazobactam (ZOSYN)  IV  3.375 g Intravenous Q12H  . sodium chloride flush  3 mL Intravenous Q12H  . sodium chloride flush  3 mL Intravenous Q12H  . tamsulosin  0.4 mg Oral Daily   sodium chloride, sodium chloride, acetaminophen, fentaNYL, midazolam, ondansetron (ZOFRAN) IV, sodium chloride flush  Assessment/ Plan:  75 y.o. male with a PMHx of severe coronary artery disease, Congestive heart failure ejection fraction 30-35%, chronic kidney disease stage IV followed by [facility] nephrology, degenerative joint disease, diabetes mellitus type 2, GERD, gout, hyperlipidemia, hypertension, prior  history of myocardial infarction, history of CVA, who was admitted to [facility] on 11/23/2015 for evaluation of weakness.  Patient suffered cardiopulmonary arrest shortly after being admitted.  1. Acute renal failure/chronic kidney disease stage IV baseline creatinine 2.3 EGFR 29 followed by [facility] nephrology. The patient has a baseline creatinine of 2.3 with an EGFR 29. Acute renal failure now likely related to sepsis, cardiopulmonary arrest, and hypotension.  -  Family continues to desire aggressive care. As such we  will proceed with continuous renal placement therapy. Pulmonary/critical care we'll place temporary dialysis catheter. We will set up continuous renal placement therapy without ultrafiltration for now.  2. Acute respiratory failure. Likely secondary to cardiopulmonary arrest.  - FiO2 currently 24%. Patient does not appear to be amenable at the moment given his underlying mental status.  3. Anemia of chronic kidney disease. Hemoglobin currently 7.9. Continue to monitor closely.  4. Hypotension.  Continue norepinephrine drip to maintain a map of 65 or greater.   LOS: 2 Robert Parks 9/9/20179:07 AM

## 2015-11-03 NOTE — Progress Notes (Signed)
Upon assessment, found excess bleeding at the HD catheter (groin) site. Contacted eLink MD, Dr. Oletta Darter. recommendation was to place thrombipad and sandbag to site in an attempt to stop the bleeding.  Also discussed w/ eLink MD pt's distended/taut (firm) abdomen - has been become more firm throughout shift. Placed OG on LIWS and abdomen softened w/in 15 minutes. MD's recommendation was to continue LIWS until the AM when pt could be seen by on-call MD.   Guadalupe Maple, RN

## 2015-11-03 NOTE — Progress Notes (Signed)
Dialysis catheter placed by Dr. Ashby Dawes.  Will set up and start CRRT.

## 2015-11-03 NOTE — Progress Notes (Signed)
[place] Progress Note Patient Name: Robert Parks DOB: [birth year] MRN: [id number]   Date of Service  11/03/2015  HPI/Events of Note  Oozing around HD catheter site. Currently on a Heparin IV infusion.  eICU Interventions  Will order: 1. Thrombipad and sandbag to site.      Intervention Category Intermediate Interventions: Other:  Lysle Dingwall 11/03/2015, 5:28 PM

## 2015-11-03 NOTE — Progress Notes (Signed)
Brief Nutrition Note  Consult received for enteral/tube feeding initiation and management.  Adult Enteral Nutrition Protocol initiated. Full assessment to follow.  Admitting Dx: Hypotension, unspecified [I95.9] UTI (lower urinary tract infection) [N39.0] Acute on chronic renal failure (HCC) [N17.9, N18.9] Sepsis, due to unspecified organism ([place]) [A41.9]  Body mass index is 24.84 kg/m. Pt meets criteria for normal weight/borderline overweight based on current BMI.  Labs:   Recent Labs Lab 11/17/2015 1011 11/02/15 0543 11/02/15 1714 11/03/15 0147  NA 135 139  --  138  K 4.7 4.7  --  4.1  CL 100* 106  --  107  CO2 21* 20*  --  19*  BUN 94* 93*  --  91*  CREATININE 4.42* 4.22*  --  4.46*  CALCIUM 8.9 8.6*  --  8.2*  MG  --  2.1 2.0 2.0  PHOS  --  5.1* 4.3 4.0  GLUCOSE 111* 84  --  81       Jarome Matin, MS, RD, LDN Inpatient Clinical Dietitian Pager # [phone] After hours/weekend pager # [phone]

## 2015-11-03 NOTE — Consult Note (Signed)
Reason for Consult:s/p arrest Referring Physician: Ether Griffins  CC: s/p arrest  HPI: Robert Parks is an 75 y.o. male with past medical history significant for history of cardiomyopathy with ejection fraction of [id number], chronic systolic CHF, CAD with baseline creatinine of 2.5, coronary artery disease, degenerative disc disease, diabetes mellitus, hyperlipidemia, hypertension, who was recently admitted to the hospital for CHF, who presented with complaints of significant weakness, inability to walk. Patient was found to be septic from a UTI and was admitted.  After admission he became unresponsive, code blue was called and patient was noted to be in PEA/asystole for approximately 10 minutes, he was intubated and transferred to CCU.  Patient with a soft cervical collar in place.  Some concern as to whether cervical disease may have contributed to code.    Past Medical History:  Diagnosis Date  . Anginal pain ([place])    had angina 2-3 mths prior to visit  . Arthritis   . CHF (congestive heart failure) ([place])   . Chronic kidney disease    follwed by Nephrologist Dr Juanito Doom in [place], Stage 3  . Coronary artery disease   . DDD (degenerative disc disease), cervical   . Diabetes mellitus without complication (HCC)    DIET CONTROLLED   . Dysphagia   . Frequent urination   . GERD (gastroesophageal reflux disease)   . Gout   . Hyperlipidemia   . Hypertension   . MI (myocardial infarction) ([place])   . Orthopnea   . Pancytopenia ([place])   . Shortness of breath dyspnea    at rest, mainly when he's active  . Stroke [facility])    ?? small stroke om 2017  came and went    Past Surgical History:  Procedure Laterality Date  . APPENDECTOMY    . BACK SURGERY  1980'S  . CARDIAC CATHETERIZATION     back in late 1990's [facility]  . COLONOSCOPY WITH PROPOFOL N/A 08/21/2014   Procedure: COLONOSCOPY WITH PROPOFOL;  Surgeon: Hulen Luster, MD;  Location: [facility] ENDOSCOPY;  Service: Gastroenterology;  Laterality: N/A;   . CORONARY ARTERY BYPASS GRAFT  1994  . EYE SURGERY Left    Cataract    Family History  Problem Relation Age of Onset  . Heart attack Mother   . Cancer Father     Social History:  reports that he has quit smoking. His smoking use included Cigarettes. He quit after 30.00 years of use. He has never used smokeless tobacco. He reports that he does not drink alcohol or use drugs.  Allergies  Allergen Reactions  . No Known Allergies Other (See Comments)    Medications:  I have reviewed the patient's current medications. Scheduled: . aspirin  81 mg Oral Daily  . atorvastatin  10 mg Oral q1800  . chlorhexidine  15 mL Mouth Rinse BID  . famotidine (PEPCID) IV  20 mg Intravenous Q24H  . feeding supplement (PRO-STAT SUGAR FREE 64)  30 mL Per Tube BID  . feeding supplement (VITAL HIGH PROTEIN)  1,000 mL Per Tube Q24H  . fentaNYL  50 mcg Intravenous Once  . fentaNYL (SUBLIMAZE) injection  50 mcg Intravenous Once  . isosorbide mononitrate  240 mg Oral Daily  . mouth rinse  15 mL Mouth Rinse Q2H  . piperacillin-tazobactam (ZOSYN)  IV  3.375 g Intravenous Q8H  . sodium chloride flush  3 mL Intravenous Q12H  . sodium chloride flush  3 mL Intravenous Q12H  . tamsulosin  0.4 mg Oral Daily  ROS: Unable to provide due to mental status  Physical Examination: Blood pressure 95/62, pulse 74, temperature 99.1 F (37.3 C), temperature source Axillary, resp. rate (!) 22, height 5\' 8"  (1.727 m), weight 74.1 kg (163 lb 5.8 oz), SpO2 100 %.  HEENT-  Normocephalic, no lesions, without obvious abnormality.  Normal external eye and conjunctiva.  Normal TM's bilaterally.  Normal auditory canals and external ears. Normal external nose, mucus membranes and septum.  Normal pharynx. Cardiovascular- S1, S2 normal, pulses palpable throughout   Lungs- chest clear, no wheezing, rales, normal symmetric air entry Abdomen- soft, non-tender; bowel sounds normal; no masses,  no organomegaly Extremities- no  edema Lymph-no adenopathy palpable Musculoskeletal-no joint tenderness, deformity or swelling Skin-warm and dry, no hyperpigmentation, vitiligo, or suspicious lesions  Neurological Examination: s/p sedation Mental Status: Patient does not respond to verbal stimuli.  Does not respond to deep sternal rub.  Does not follow commands.  No verbalizations noted.  Cranial Nerves: II: patient does not respond confrontation bilaterally, pupils right 3 mm, left 2 mm,and unreactive bilaterally III,IV,VI: doll's response present bilaterally.  V,VII: corneal reflex present bilaterally  VIII: patient does not respond to verbal stimuli IX,X: gag reflex reduced, XI: trapezius strength unable to test bilaterally XII: tongue strength unable to test Motor: Moves upper extremities some spontaneously.  No movement noted in the lower extremities Sensory: Does not respond to noxious stimuli in any extremity. Deep Tendon Reflexes:  Absent throughout. Plantars: upgoing bilaterally Cerebellar: Unable to perform        Laboratory Studies:   Basic Metabolic Panel:  Recent Labs Lab 11/19/2015 1011 11/02/15 0543 11/02/15 1714 11/03/15 0147 11/03/15 1002  NA 135 139  --  138 139  K 4.7 4.7  --  4.1 3.5  CL 100* 106  --  107 108  CO2 21* 20*  --  19* 21*  GLUCOSE 111* 84  --  81 114*  BUN 94* 93*  --  91* 95*  CREATININE 4.42* 4.22*  --  4.46* 4.50*  CALCIUM 8.9 8.6*  --  8.2* 8.2*  MG  --  2.1 2.0 2.0  --   PHOS  --  5.1* 4.3 4.0 3.9    Liver Function Tests:  Recent Labs Lab 11/02/15 0543 11/03/15 0147 11/03/15 1002  AST 38 48*  --   ALT 18 20  --   ALKPHOS 81 72  --   BILITOT 0.7 1.0  --   PROT 5.3* 5.2*  --   ALBUMIN 3.0* 2.8* 2.8*   No results for input(s): LIPASE, AMYLASE in the last 168 hours. No results for input(s): AMMONIA in the last 168 hours.  CBC:  Recent Labs Lab 11/05/2015 1011 11/02/15 0543 11/03/15 0147  WBC 3.7* 4.1 4.4  HGB 8.6* 8.0* 7.9*  HCT 25.5* 23.6*  23.2*  MCV 93.9 94.4 92.6  PLT 87* 74* 80*    Cardiac Enzymes:  Recent Labs Lab 11/08/2015 1011 11/08/2015 1717 10/31/2015 2218 11/02/15 0543  TROPONINI 0.16* 0.14* 0.13* 0.20*    BNP: Invalid input(s): POCBNP  CBG:  Recent Labs Lab 11/02/15 1959 11/02/15 2356 11/03/15 0334 11/03/15 0717 11/03/15 1104  GLUCAP 158* 75 95 99 107*    Microbiology: Results for orders placed or performed during the hospital encounter of 10/27/2015  Urine culture     Status: Abnormal   Collection Time: 11/17/2015 10:11 AM  Result Value Ref Range Status   Specimen Description URINE, RANDOM  Final   Special Requests NONE  Final  Culture >=100,000 COLONIES/mL KLEBSIELLA PNEUMONIAE (A)  Final   Report Status 11/03/2015 FINAL  Final   Organism ID, Bacteria KLEBSIELLA PNEUMONIAE (A)  Final      Susceptibility   Klebsiella pneumoniae - MIC*    AMPICILLIN >=32 RESISTANT Resistant     CEFAZOLIN <=4 SENSITIVE Sensitive     CEFTRIAXONE <=1 SENSITIVE Sensitive     CIPROFLOXACIN <=0.25 SENSITIVE Sensitive     GENTAMICIN <=1 SENSITIVE Sensitive     IMIPENEM <=0.25 SENSITIVE Sensitive     NITROFURANTOIN <=16 SENSITIVE Sensitive     TRIMETH/SULFA <=20 SENSITIVE Sensitive     AMPICILLIN/SULBACTAM 4 SENSITIVE Sensitive     PIP/TAZO <=4 SENSITIVE Sensitive     Extended ESBL NEGATIVE Sensitive     * >=100,000 COLONIES/mL KLEBSIELLA PNEUMONIAE  Culture, blood (routine x 2)     Status: None (Preliminary result)   Collection Time: 10/31/2015 11:13 AM  Result Value Ref Range Status   Specimen Description BLOOD RIGHT AC  Final   Special Requests   Final    BOTTLES DRAWN AEROBIC AND ANAEROBIC ANA 8ML AER 10ML   Culture NO GROWTH 2 DAYS  Final   Report Status PENDING  Incomplete  Culture, blood (routine x 2)     Status: None (Preliminary result)   Collection Time: 10/28/2015 11:13 AM  Result Value Ref Range Status   Specimen Description BLOOD LEFT ARM  Final   Special Requests BOTTLES DRAWN AEROBIC AND ANAEROBIC  [place]  Final   Culture NO GROWTH 2 DAYS  Final   Report Status PENDING  Incomplete  Culture, blood (routine x 2)     Status: None (Preliminary result)   Collection Time: 11/02/15  2:17 AM  Result Value Ref Range Status   Specimen Description BLOOD RIGHT HAND  Final   Special Requests BOTTLES DRAWN AEROBIC AND ANAEROBIC 5CC  Final   Culture NO GROWTH 1 DAY  Final   Report Status PENDING  Incomplete  Culture, blood (routine x 2)     Status: None (Preliminary result)   Collection Time: 11/02/15  2:28 AM  Result Value Ref Range Status   Specimen Description BLOOD LEFT HAND  Final   Special Requests BOTTLES DRAWN AEROBIC AND ANAEROBIC [place]  Final   Culture NO GROWTH 1 DAY  Final   Report Status PENDING  Incomplete  MRSA PCR Screening     Status: None   Collection Time: 11/02/15 10:42 AM  Result Value Ref Range Status   MRSA by PCR NEGATIVE NEGATIVE Final    Comment:        The GeneXpert MRSA Assay (FDA approved for NASAL specimens only), is one component of a comprehensive MRSA colonization surveillance program. It is not intended to diagnose MRSA infection nor to guide or monitor treatment for MRSA infections.     Coagulation Studies: No results for input(s): LABPROT, INR in the last 72 hours.  Urinalysis:  Recent Labs Lab 11/15/2015 1011 10/31/2015 1415  COLORURINE AMBER* AMBER*  LABSPEC 1.014 1.012  PHURINE 7.0 5.0  GLUCOSEU NEGATIVE NEGATIVE  HGBUR 1+* 3+*  BILIRUBINUR NEGATIVE NEGATIVE  KETONESUR NEGATIVE NEGATIVE  PROTEINUR 100* 100*  NITRITE NEGATIVE NEGATIVE  LEUKOCYTESUR 3+* 3+*    Lipid Panel:  No results found for: CHOL, TRIG, HDL, CHOLHDL, VLDL, LDLCALC  HgbA1C:  Lab Results  Component Value Date   HGBA1C 5.6 06/22/2015    Urine Drug Screen:  No results found for: LABOPIA, COCAINSCRNUR, LABBENZ, AMPHETMU, THCU, LABBARB  Alcohol Level: No results for input(s):  ETH in the last 168 hours.   Imaging: Dg Chest 1 View  Result Date: 11/03/2015 CLINICAL  DATA:  Patient with history of shortness of breath and abdominal distension. EXAM: CHEST 1 VIEW COMPARISON:  Chest radiograph 11/02/2015. FINDINGS: Patient is rotated to the left. ET tube terminates in the mid trachea. Enteric tube courses inferior to the diaphragm. Pacer apparatus overlies the mediastinum. Monitoring leads overlie the patient. Stable cardiomegaly. Small bilateral layering pleural effusions, right-greater-than-left. Underlying pulmonary opacities. No pneumothorax. IMPRESSION: ETT terminates in the mid trachea. Cardiomegaly. Right-greater-than-left layering pleural effusions with underlying opacities. Electronically Signed   By: Lovey Newcomer M.D.   On: 11/03/2015 07:27   Dg Abd 1 View  Result Date: 11/03/2015 CLINICAL DATA:  Patient with shortness of breath and abdominal distension. History of stroke. EXAM: ABDOMEN - 1 VIEW COMPARISON:  Abdominal radiograph 11/02/2015 FINDINGS: ET tube tip and side-port project over the stomach. Pacer apparatus overlies the mediastinum. Multiple monitoring leads overlie the patient. Nonobstructed bowel gas pattern. Small to moderate right pleural effusion. Underlying pulmonary opacities. IMPRESSION: ET tube tip and side-port project in the stomach. Nonobstructed bowel gas pattern. Electronically Signed   By: Lovey Newcomer M.D.   On: 11/03/2015 07:41   Dg Abd 1 View  Result Date: 11/02/2015 CLINICAL DATA:  Orogastric tube placement.  Initial encounter. EXAM: ABDOMEN - 1 VIEW COMPARISON:  None. FINDINGS: The patient's enteric tube is noted ending overlying the body of the stomach. The visualized bowel gas pattern is grossly unremarkable. No free intra-abdominal air is seen, though evaluation for free air is limited on a single supine view. A small right pleural effusion is noted. No acute osseous abnormalities are seen. IMPRESSION: 1. Enteric tube noted ending overlying the body of the stomach. 2. Small right pleural effusion noted. Electronically Signed   By:  Garald Balding M.D.   On: 11/02/2015 03:05   Ct Head Wo Contrast  Result Date: 11/02/2015 CLINICAL DATA:  Status post cardiac arrest EXAM: CT HEAD WITHOUT CONTRAST TECHNIQUE: Contiguous axial images were obtained from the base of the skull through the vertex without intravenous contrast. COMPARISON:  MRI brain dated 06/08/2015 FINDINGS: Brain: No evidence of acute infarction, hemorrhage, hydrocephalus, extra-axial collection or mass lesion/mass effect. Vascular: No hyperdense vessel or unexpected calcification. Skull: No evidence of calvarial fracture. Sinuses/Orbits: The visualized paranasal sinuses are essentially clear. The mastoid air cells are unopacified. Other: Subcortical white matter and periventricular small vessel ischemic changes. Intracranial atherosclerosis. Global cortical atrophy.  No ventriculomegaly. IMPRESSION: No evidence of acute intracranial abnormality. Atrophy with small vessel ischemic changes. Electronically Signed   By: Julian Hy M.D.   On: 11/02/2015 09:42   Ct Cervical Spine Wo Contrast  Result Date: 11/02/2015 CLINICAL DATA:  Cardiac arrest, a systole for about 10 minutes. Neck pain. Intubated. EXAM: CT CERVICAL SPINE WITHOUT CONTRAST TECHNIQUE: Multidetector CT imaging of the cervical spine was performed without intravenous contrast. Multiplanar CT image reconstructions were also generated. COMPARISON:  Multiple exams, including 06/08/2015 MRI FINDINGS: Alignment: 1.5 mm degenerative anterolisthesis at C4-5. Skull base and vertebrae: Anterior spurring and loss of articular space at the C1-2 articulation. Multilevel degenerative facet arthropathy and anterior intervertebral spurring noted, with some completely bridging spurring on the right at C5-6. Loss of disc height at C5-6 with posterior osseous ridging. Several lucencies are present scattered in the vertebral bodies, possibly small hemangiomas but technically nonspecific. Soft tissues and spinal canal: Atherosclerotic  calcification of the carotid bulbs bilaterally. Disc protrusion at C4-5 and disc bulge  at C5-6. Disc levels: Cervical spondylosis causes osseous foraminal narrowing on the left at C3-4, C4-5, and C5-6; and on the right at C3-4, C4-5, and C5-6. Underlying degenerative disc disease likely contributes to the foraminal impingement at the C4-5 and C5-6 levels. Degenerative disc disease is believed to be causing at least moderate central narrowing of the thecal sac at C4-5 and C5-6. Upper chest: Right pleural effusion. Other: Generalized subcutaneous edema. IMPRESSION: 1. No cervical spine fracture or acute subluxation. 2. Cervical spondylosis and degenerative disc disease, causing impingement at C3-4, C4-5, and C5-6. 3. Right pleural effusion. 4. Subcutaneous edema. 5. Atherosclerotic calcification of the carotid bulbs bilaterally. Electronically Signed   By: Van Clines M.D.   On: 11/02/2015 17:10   US Venous Img Lower Bilateral  Result Date: 11/02/2015 CLINICAL DATA:  Bilateral lower extremity swelling. Short of breath for 1 day. EXAM: BILATERAL LOWER EXTREMITY VENOUS DUPLEX ULTRASOUND TECHNIQUE: Doppler venous assessment of the bilateral lower extremity deep venous system was performed, including characterization of spectral flow, compressibility, and phasicity. COMPARISON:  None. FINDINGS: There is complete compressibility of the bilateral common femoral, femoral, and popliteal veins. Doppler analysis demonstrates respiratory phasicity and augmentation of flow with calf compression. No obvious superficial vein or calf vein thrombosis. IMPRESSION: No evidence of lower extremity DVT. Electronically Signed   By: Marybelle Killings M.D.   On: 11/02/2015 15:58   Dg Chest Port 1 View  Result Date: 11/02/2015 CLINICAL DATA:  75 year old male with endotracheal tube placement. EXAM: PORTABLE CHEST 1 VIEW COMPARISON:  Chest radiograph dated 10/11/2015 FINDINGS: An endotracheal tube with tip approximately 4.5 cm above  the carina and an enteric tube coursing into the left hemi abdomen with tip beyond the inferior margin of the image noted. There is stable cardiomegaly with mild vascular congestion. There is a small right pleural effusion, new or increased from prior study. There is associated partial compressive atelectasis of the right lower lobe. Pneumonia is not excluded. Clinical correlation and follow-up recommended. The left lung is clear. There is no pneumothorax. Median sternotomy wires and CABG vascular clips noted. No acute osseous pathology. IMPRESSION: Endotracheal tube above the carina. Cardiomegaly with mild congestive changes and small right pleural effusion. Superimposed pneumonia is not excluded. Clinical correlation and follow-up recommended. Electronically Signed   By: Anner Crete M.D.   On: 11/02/2015 03:01     Assessment/Plan: 75 year old male admitted with sepsis and evidence of multiorgan failure.  Soft cervical collar in place.  CT of the cervical spine reviewed and shows multiple level of spondylosis and degenerative disc disease with associated neuroforaminal stenosis but nothing that would be expected to have contributed to the patient's cardiorespiratory arrest.  Agree with addressing medical issues.  Although patient may have some degree of spinal stenosis this could not be addressed in patient's current medical state.    No further neurologic intervention is recommended at this time.  If further questions arise, please call or page at that time.  Thank you for allowing neurology to participate in the care of this patient.   Alexis Goodell, MD Neurology [phone] 11/03/2015, 12:38 PM

## 2015-11-03 NOTE — Progress Notes (Signed)
MEDICATION RELATED CONSULT NOTE - INITIAL   Pharmacy Consult for CRRT Medication Monitoirng   Allergies  Allergen Reactions  . No Known Allergies Other (See Comments)    Patient Measurements: Height: 5\' 8"  (172.7 cm) Weight: 163 lb 5.8 oz (74.1 kg) IBW/kg (Calculated) : 68.4   Vital Signs: Temp: 99.1 F (37.3 C) (09/09 0800) Temp Source: Axillary (09/09 0800) BP: 92/59 (09/09 1000) Pulse Rate: 75 (09/09 1000) Intake/Output from previous day: 09/08 0701 - 09/09 0700 In: 3756.4 [I.V.:1681.8; NG/GT:374.7; IV Piggyback:1700] Out: 441 [Urine:441] Intake/Output from this shift: Total I/O In: 270.3 [I.V.:190.3; NG/GT:80] Out: 35 [Urine:35]  Labs:  Recent Labs  11/21/2015 1011 11/02/15 0543 11/02/15 1714 11/03/15 0147  WBC 3.7* 4.1  --  4.4  HGB 8.6* 8.0*  --  7.9*  HCT 25.5* 23.6*  --  23.2*  PLT 87* 74*  --  80*  CREATININE 4.42* 4.22*  --  4.46*  MG  --  2.1 2.0 2.0  PHOS  --  5.1* 4.3 4.0  ALBUMIN  --  3.0*  --  2.8*  PROT  --  5.3*  --  5.2*  AST  --  38  --  48*  ALT  --  18  --  20  ALKPHOS  --  81  --  72  BILITOT  --  0.7  --  1.0   Estimated Creatinine Clearance: 13.8 mL/min (by C-G formula based on SCr of 4.46 mg/dL).   Microbiology: Recent Results (from the past 720 hour(s))  Urine culture     Status: Abnormal   Collection Time: 11/16/2015 10:11 AM  Result Value Ref Range Status   Specimen Description URINE, RANDOM  Final   Special Requests NONE  Final   Culture >=100,000 COLONIES/mL KLEBSIELLA PNEUMONIAE (A)  Final   Report Status 11/03/2015 FINAL  Final   Organism ID, Bacteria KLEBSIELLA PNEUMONIAE (A)  Final      Susceptibility   Klebsiella pneumoniae - MIC*    AMPICILLIN >=32 RESISTANT Resistant     CEFAZOLIN <=4 SENSITIVE Sensitive     CEFTRIAXONE <=1 SENSITIVE Sensitive     CIPROFLOXACIN <=0.25 SENSITIVE Sensitive     GENTAMICIN <=1 SENSITIVE Sensitive     IMIPENEM <=0.25 SENSITIVE Sensitive     NITROFURANTOIN <=16 SENSITIVE Sensitive      TRIMETH/SULFA <=20 SENSITIVE Sensitive     AMPICILLIN/SULBACTAM 4 SENSITIVE Sensitive     PIP/TAZO <=4 SENSITIVE Sensitive     Extended ESBL NEGATIVE Sensitive     * >=100,000 COLONIES/mL KLEBSIELLA PNEUMONIAE  Culture, blood (routine x 2)     Status: None (Preliminary result)   Collection Time: 10/31/2015 11:13 AM  Result Value Ref Range Status   Specimen Description BLOOD RIGHT AC  Final   Special Requests   Final    BOTTLES DRAWN AEROBIC AND ANAEROBIC ANA 8ML AER 10ML   Culture NO GROWTH 2 DAYS  Final   Report Status PENDING  Incomplete  Culture, blood (routine x 2)     Status: None (Preliminary result)   Collection Time: 11/15/2015 11:13 AM  Result Value Ref Range Status   Specimen Description BLOOD LEFT ARM  Final   Special Requests BOTTLES DRAWN AEROBIC AND ANAEROBIC [place]  Final   Culture NO GROWTH 2 DAYS  Final   Report Status PENDING  Incomplete  Culture, blood (routine x 2)     Status: None (Preliminary result)   Collection Time: 11/02/15  2:17 AM  Result Value Ref Range Status  Specimen Description BLOOD RIGHT HAND  Final   Special Requests BOTTLES DRAWN AEROBIC AND ANAEROBIC 5CC  Final   Culture NO GROWTH 1 DAY  Final   Report Status PENDING  Incomplete  Culture, blood (routine x 2)     Status: None (Preliminary result)   Collection Time: 11/02/15  2:28 AM  Result Value Ref Range Status   Specimen Description BLOOD LEFT HAND  Final   Special Requests BOTTLES DRAWN AEROBIC AND ANAEROBIC [place]  Final   Culture NO GROWTH 1 DAY  Final   Report Status PENDING  Incomplete  MRSA PCR Screening     Status: None   Collection Time: 11/02/15 10:42 AM  Result Value Ref Range Status   MRSA by PCR NEGATIVE NEGATIVE Final    Comment:        The GeneXpert MRSA Assay (FDA approved for NASAL specimens only), is one component of a comprehensive MRSA colonization surveillance program. It is not intended to diagnose MRSA infection nor to guide or monitor treatment for MRSA  infections.     Medical History: Past Medical History:  Diagnosis Date  . Anginal pain ([place])    had angina 2-3 mths prior to visit  . Arthritis   . CHF (congestive heart failure) ([place])   . 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 Coronary artery disease   . DDD (degenerative disc disease), cervical   . Diabetes mellitus without complication (HCC)    DIET CONTROLLED   . Dysphagia   . Frequent urination   . GERD (gastroesophageal reflux disease)   . Gout   . Hyperlipidemia   . Hypertension   . MI (myocardial infarction) ([place])   . Orthopnea   . Pancytopenia ([place])   . Shortness of breath dyspnea    at rest, mainly when he's active  . Stroke [facility])    ?? small stroke om 2017  came and went    Medications:  Scheduled:  . aspirin  81 mg Oral Daily  . atorvastatin  10 mg Oral q1800  . chlorhexidine  15 mL Mouth Rinse BID  . famotidine (PEPCID) IV  20 mg Intravenous Q24H  . feeding supplement (PRO-STAT SUGAR FREE 64)  30 mL Per Tube BID  . feeding supplement (VITAL HIGH PROTEIN)  1,000 mL Per Tube Q24H  . fentaNYL (SUBLIMAZE) injection  50 mcg Intravenous Once  . isosorbide mononitrate  240 mg Oral Daily  . mouth rinse  15 mL Mouth Rinse Q2H  . piperacillin-tazobactam (ZOSYN)  IV  3.375 g Intravenous Q8H  . sodium chloride flush  3 mL Intravenous Q12H  . sodium chloride flush  3 mL Intravenous Q12H  . tamsulosin  0.4 mg Oral Daily   Infusions:  . sodium chloride 50 mL/hr at 11/03/15 0600  . fentaNYL infusion INTRAVENOUS 125 mcg/hr (11/03/15 0600)  . heparin 1,000 Units/hr (11/03/15 0600)  . norepinephrine 6 mcg/min (11/03/15 [id number])  . pureflow      Assessment: Pharmacy consulted to adjust medication dosing for CRRT in this 75 y/o M with sepsis and respiratory failure.   Plan:  Will change Zosyn to q 8 hour dosing. No further medication adjustments are necessary currently. Will continue to follow.   Ulice Dash  D 11/03/2015,10:19 AM

## 2015-11-03 NOTE — Progress Notes (Signed)
Dr. Humphrey Rolls present on the unit and saw patient and talking with family now.  RN made Dr. Humphrey Rolls aware that patient's heart rhythm has been NSR/sinus arrhythmia with frequent PVCs, also having PACs and during the night having couplets.  Also around 0730 this morning ryhtm seemed to go from NSR to questionable aflutter?  Dr. Humphrey Rolls acknowledged and stated that he would try amioderone drip but patient is on levophed to support patient's blood pressure.  No new orders given.

## 2015-11-03 NOTE — Progress Notes (Signed)
ANTICOAGULATION CONSULT NOTE - Initial Consult  Pharmacy Consult for Heparin  Indication: chest pain/ACS  Allergies  Allergen Reactions  . No Known Allergies Other (See Comments)    Patient Measurements: Height: 5\' 8"  (172.7 cm) Weight: 154 lb 5.2 oz (70 kg) IBW/kg (Calculated) : 68.4 Heparin Dosing Weight: 64 kg   Vital Signs: Temp: 98.7 F (37.1 C) (09/09 0000) Temp Source: Axillary (09/09 0000) BP: 97/69 (09/09 0300) Pulse Rate: 99 (09/09 0300)  Labs:  Recent Labs  10/28/2015 1011 11/09/2015 1717 11/04/2015 2218 11/02/15 0543 11/03/15 0147  HGB 8.6*  --   --  8.0* 7.9*  HCT 25.5*  --   --  23.6* 23.2*  PLT 87*  --   --  74* 80*  HEPARINUNFRC  --   --   --   --  0.18*  CREATININE 4.42*  --   --  4.22* 4.46*  TROPONINI 0.16* 0.14* 0.13* 0.20*  --     Estimated Creatinine Clearance: 13.8 mL/min (by C-G formula based on SCr of 4.46 mg/dL).   Medical History: Past Medical History:  Diagnosis Date  . Anginal pain ([place])    had angina 2-3 mths prior to visit  . Arthritis   . CHF (congestive heart failure) ([place])   . Chronic kidney disease    follwed by Nephrologist Dr Juanito Doom in [place], Stage 3  . Coronary artery disease   . DDD (degenerative disc disease), cervical   . Diabetes mellitus without complication (HCC)    DIET CONTROLLED   . Dysphagia   . Frequent urination   . GERD (gastroesophageal reflux disease)   . Gout   . Hyperlipidemia   . Hypertension   . MI (myocardial infarction) ([place])   . Orthopnea   . Pancytopenia ([place])   . Shortness of breath dyspnea    at rest, mainly when he's active  . Stroke [facility])    ?? small stroke om 2017  came and went    Medications:  Scheduled:  . aspirin EC  81 mg Oral Daily  . atorvastatin  10 mg Oral q1800  . chlorhexidine  15 mL Mouth Rinse BID  . dextrose      . famotidine (PEPCID) IV  20 mg Intravenous Q24H  . feeding supplement (PRO-STAT SUGAR FREE 64)  30 mL Per Tube BID  . feeding supplement (VITAL  HIGH PROTEIN)  1,000 mL Per Tube Q24H  . fentaNYL (SUBLIMAZE) injection  50 mcg Intravenous Once  . heparin  1,900 Units Intravenous Once  . isosorbide mononitrate  240 mg Oral Daily  . mouth rinse  15 mL Mouth Rinse Q2H  . piperacillin-tazobactam (ZOSYN)  IV  3.375 g Intravenous Q12H  . sodium chloride flush  3 mL Intravenous Q12H  . sodium chloride flush  3 mL Intravenous Q12H  . tamsulosin  0.4 mg Oral Daily    Assessment: Pharmacy consulted to dose heparin in this 75 year old male with ACS/STEMI.    CrCl = 14.6 ml/min Pt was on heparin 5000 units SQ Q8H previously, last dose on 9/8 @ 1345.    Goal of Therapy:  Heparin level 0.3-0.7 units/ml Monitor platelets by anticoagulation protocol: Yes   Plan:  Will d/c heparin 5000 units SQ Q8H. Will start heparin gtt 800 units/hr , no bolus. Will draw 1st HL 8 hrs after start of drip.   9/9 0200 heparin level 0.18. 1900 unit bolus and increase rate to 1000 units/hr. Recheck in 8 hours.   Azucena Dart S 11/03/2015,4:39  AM

## 2015-11-03 NOTE — Progress Notes (Signed)
Today we initiated CRRT-CVVHD and pt's BUN has decreased from 91 to 67 and creatinine is down to 3.36 from 4.46.   Unfractionated heparin resulted - 0.22. Pharmacy increased heparin drip to 43mL/hr.   Patient began to nod/shake head appropriately to questions around 1030 this morning - clearly oriented to person. Attempting to decrease distention of abdomen by placing OG to LIWS - significant output 163mL w/in 2/3 hours.   eLink MD recommended to keep pt on LIWS throughout the night and continue to monitor distention.   Robert Maple, RN

## 2015-11-03 NOTE — Progress Notes (Signed)
SUBJECTIVE: Patient remains intubated and not responsive appropriately to command.   Vitals:   11/03/15 1015 11/03/15 1030 11/03/15 1045 11/03/15 1100  BP: (!) 89/53 96/61 99/65  (!) 97/56  Pulse: 77 76 75 73  Resp: 18 18 18 19   Temp:      TempSrc:      SpO2: 100% 100% 100% 100%  Weight:      Height:        Intake/Output Summary (Last 24 hours) at 11/03/15 1107 Last data filed at 11/03/15 1000  Gross per 24 hour  Intake          3779.12 ml  Output              435 ml  Net          3344.12 ml    LABS: Basic Metabolic Panel:  Recent Labs  11/02/15 1714 11/03/15 0147 11/03/15 1002  NA  --  138 139  K  --  4.1 3.5  CL  --  107 108  CO2  --  19* 21*  GLUCOSE  --  81 114*  BUN  --  91* 95*  CREATININE  --  4.46* 4.50*  CALCIUM  --  8.2* 8.2*  MG 2.0 2.0  --   PHOS 4.3 4.0 3.9   Liver Function Tests:  Recent Labs  11/02/15 0543 11/03/15 0147 11/03/15 1002  AST 38 48*  --   ALT 18 20  --   ALKPHOS 81 72  --   BILITOT 0.7 1.0  --   PROT 5.3* 5.2*  --   ALBUMIN 3.0* 2.8* 2.8*   No results for input(s): LIPASE, AMYLASE in the last 72 hours. CBC:  Recent Labs  11/02/15 0543 11/03/15 0147  WBC 4.1 4.4  HGB 8.0* 7.9*  HCT 23.6* 23.2*  MCV 94.4 92.6  PLT 74* 80*   Cardiac Enzymes:  Recent Labs  11/24/2015 1717 11/14/2015 2218 11/02/15 0543  TROPONINI 0.14* 0.13* 0.20*   BNP: Invalid input(s): POCBNP D-Dimer: No results for input(s): DDIMER in the last 72 hours. Hemoglobin A1C: No results for input(s): HGBA1C in the last 72 hours. Fasting Lipid Panel: No results for input(s): CHOL, HDL, LDLCALC, TRIG, CHOLHDL, LDLDIRECT in the last 72 hours. Thyroid Function Tests: No results for input(s): TSH, T4TOTAL, T3FREE, THYROIDAB in the last 72 hours.  Invalid input(s): FREET3 Anemia Panel: No results for input(s): VITAMINB12, FOLATE, FERRITIN, TIBC, IRON, RETICCTPCT in the last 72 hours.   PHYSICAL EXAM General: Well developed, well nourished, in no  acute distress HEENT:  Normocephalic and atramatic Neck:  No JVD.  Lungs: Clear bilaterally to auscultation and percussion. Heart: HRRR . Normal S1 and S2 without gallops or murmurs.  Abdomen: Bowel sounds are positive, abdomen soft and non-tender  Msk:  Back normal, normal gait. Normal strength and tone for age. Extremities: No clubbing, cyanosis or edema.   Neuro: Alert and oriented X 3. Psych:  Good affect, responds appropriately  TELEMETRY: Sinus rhythm with heart rate 70 bpm with frequent PVCs couplets and the monitor shows runs of atrial fibrillation.  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 Patient is requiring high dose of Levothroid and blood pressure systolic is still 90. Patient also has known history of coronary artery disease. Patient also has renal failure besides respiratory failure. He is supposed to get dialysis today. I was asked to talk to the family and I talked to the family for 30 minutes  as they had a lot of questions about his cardiac status as well as his status in general. I explained to them the prognosis is very poor since he has severe LV dysfunction and multiorgan failure. I advised that he should be DO NOT RESUSCITATE. Family is going to think about it. Time spent over 1 hour.  Principal Problem:   UTI (lower urinary tract infection) Active Problems:   Cardiac arrest ([place])   Acute respiratory failure (HCC)    Kathlyn Leachman A, MD, [facility] 11/03/2015 11:07 AM

## 2015-11-03 NOTE — Progress Notes (Signed)
ANTICOAGULATION CONSULT NOTE - Initial Consult  Pharmacy Consult for Heparin  Indication: chest pain/ACS  Allergies  Allergen Reactions  . No Known Allergies Other (See Comments)    Patient Measurements: Height: 5\' 8"  (172.7 cm) Weight: 163 lb 5.8 oz (74.1 kg) IBW/kg (Calculated) : 68.4 Heparin Dosing Weight: 64 kg   Vital Signs: Temp: 98.8 F (37.1 C) (09/09 1300) Temp Source: Axillary (09/09 1300) BP: 93/59 (09/09 1445) Pulse Rate: 71 (09/09 1445)  Labs:  Recent Labs  11/03/2015 1011 11/11/2015 1717 10/31/2015 2218 11/02/15 0543 11/03/15 0147 11/03/15 1002 11/03/15 1347  HGB 8.6*  --   --  8.0* 7.9*  --   --   HCT 25.5*  --   --  23.6* 23.2*  --   --   PLT 87*  --   --  74* 80*  --   --   HEPARINUNFRC  --   --   --   --  0.18*  --  0.22*  CREATININE 4.42*  --   --  4.22* 4.46* 4.50*  --   TROPONINI 0.16* 0.14* 0.13* 0.20*  --   --   --     Estimated Creatinine Clearance: 13.7 mL/min (by C-G formula based on SCr of 4.5 mg/dL).   Medical History: Past Medical History:  Diagnosis Date  . Anginal pain ([place])    had angina 2-3 mths prior to visit  . Arthritis   . CHF (congestive heart failure) ([place])   . Chronic kidney disease    follwed by Nephrologist Dr Juanito Doom in [place], Stage 3  . Coronary artery disease   . DDD (degenerative disc disease), cervical   . Diabetes mellitus without complication (HCC)    DIET CONTROLLED   . Dysphagia   . Frequent urination   . GERD (gastroesophageal reflux disease)   . Gout   . Hyperlipidemia   . Hypertension   . MI (myocardial infarction) ([place])   . Orthopnea   . Pancytopenia ([place])   . Shortness of breath dyspnea    at rest, mainly when he's active  . Stroke [facility])    ?? small stroke om 2017  came and went    Medications:  Scheduled:  . aspirin  81 mg Oral Daily  . atorvastatin  10 mg Oral q1800  . chlorhexidine  15 mL Mouth Rinse BID  . famotidine (PEPCID) IV  20 mg Intravenous Q24H  . feeding supplement  (PRO-STAT SUGAR FREE 64)  30 mL Per Tube BID  . feeding supplement (VITAL HIGH PROTEIN)  1,000 mL Per Tube Q24H  . fentaNYL  50 mcg Intravenous Once  . isosorbide mononitrate  240 mg Oral Daily  . mouth rinse  15 mL Mouth Rinse Q2H  . metoCLOPramide (REGLAN) injection  20 mg Intravenous Q8H  . piperacillin-tazobactam (ZOSYN)  IV  3.375 g Intravenous Q8H  . polyethylene glycol  17 g Oral BID  . sennosides  10 mL Oral BID  . sodium chloride flush  3 mL Intravenous Q12H  . sodium chloride flush  3 mL Intravenous Q12H  . tamsulosin  0.4 mg Oral Daily    Assessment: Pharmacy consulted to dose heparin in this 75 year old male with ACS/STEMI.    CrCl = 14.6 ml/min Pt was on heparin 5000 units SQ Q8H previously, last dose on 9/8 @ 1345.    Goal of Therapy:  Heparin level 0.3-0.7 units/ml Monitor platelets by anticoagulation protocol: Yes   Plan:  Current orders for heparin 1000 units/hr.  Heparin level 0.22, subtherapeutic. Will bolus 900 units, increase rate to 1100 units/hr. Recheck HL in 8 hours.   Sharalee Witman C 11/03/2015,2:56 PM

## 2015-11-03 NOTE — Progress Notes (Signed)
Pharmacy Antibiotic Note  Robert Parks is a 75 y.o. male admitted on 10/26/2015 with UTI.  Pharmacy has been consulted for piperacillin/tazobactam dosing for UTI and possible aspiration.  Plan: Urine cultures with > 100k [place], sensitive to cefazolin. Discussed with MD, still concerned for aspiration - would like to continue zosyn.  Continue piperacillin/tazobactam 3.375 g IV q8H EI (CRRT dosing)  Height: 5\' 8"  (172.7 cm) Weight: 163 lb 5.8 oz (74.1 kg) IBW/kg (Calculated) : 68.4  Temp (24hrs), Avg:97.9 F (36.6 C), Min:97 F (36.1 C), Max:99.1 F (37.3 C)   Recent Labs Lab 11/20/2015 1011 11/07/2015 1113 11/24/2015 1415 11/02/15 0228 11/02/15 0543 11/03/15 0147 11/03/15 1002  WBC 3.7*  --   --   --  4.1 4.4  --   CREATININE 4.42*  --   --   --  4.22* 4.46* 4.50*  LATICACIDVEN  --  2.4* 1.7 4.5* 2.7*  --   --     Estimated Creatinine Clearance: 13.7 mL/min (by C-G formula based on SCr of 4.5 mg/dL).    Allergies  Allergen Reactions  . No Known Allergies Other (See Comments)    Antimicrobials this admission: Ceftriaxone 1 dose on 9/7 Piperacillin/tazobactam 9/8 >>  Dose adjustments this admission: n/a  Microbiology results: 9/7 BCx: No growth <24 hours 9/7 UCx: > 100k klebsiella, see sensitivites  9/7 UA: LE(+) NO2(-) WBC TNTC  Thank you for allowing pharmacy to be a part of this patient's care.  Cheri Guppy, PharmD Clinical Pharmacist 11/03/2015 11:45 AM

## 2015-11-03 NOTE — Progress Notes (Signed)
* [facility]    Attestation note:  Patient seen and examined with NP, agree with assessment and plan. 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  Today the patient is slightly more awake, appears to follow some commands. Review of his laboratory value shows continued decline in his renal function, creatinine of 4.5, increased from yesterday. Review of chest x-ray shows continued right basal atelectasis, with possible right-sided pneumonia. Patient's abdomen was distended, normal x-ray shows dilated bowel loops, will start a prokinetic agent.  Marda Stalker, M.D.  11/03/2015 Critical Care Attestation.  I have personally obtained a history, examined the patient, evaluated laboratory and imaging results, formulated the assessment and plan and placed orders. The Patient requires high complexity decision making for assessment and support, frequent evaluation and titration of therapies, application of advanced monitoring technologies and extensive interpretation of multiple databases. The patient has critical illness that could lead imminently to failure of 1 or more organ systems and requires the highest level of physician preparedness to intervene.  Critical Care Time devoted to patient care services described in this note is 40 minutes and is exclusive of time spent in procedures.  Advanced care planning note:  Patient's condition was discussed with the family including the patient's wife and his sisters, patient's nurse, nephrology was also in attendance. We explained that the patient's prognosis is poor, given his multiorgan failure, which includes cardiac arrest, chronic systolic congestive heart failure with EF 35%, acute on chronic kidney disease, acute hypoxic respiratory failure, ventilator dependent, as well as hypotension. We explained that should the patient's heart  stopped once again to be unlikely that we could get him back and he would have a good chance of meaningful recovery. Explained the risks and benefits of placing the patient on a dialysis machine, as well as placement of a dialysis catheter. This will discuss the likelihood that the patient will be on hemodialysis permanently. They appeared to understand the risks and benefits, they are not ready to transition to comfort measures only and would like to continue all measures including keeping the patient full code. We'll therefore proceed with the placement of hemodialysis catheter, initiate dialysis. Continue to monitor the patient as per patient's family's wishes. 30 min spent in advance care planning.  -Deep Dennie Moltz 11/03/2015   ASSESSMENT / PLAN: 75 yo AAM with multiple medical issues admitted for sepsis from UTI with acidosis with acute cardiac arrest from acute CHF and acute NSTEMI, with multiorgan failure  PULMONARY A: Acute Respiratory Failure 2/2 cardiac arrest P: -continue Full MV support -Continue Bronchodilator Therapy -Wean Fio2 and PEEP as tolerated -Review of ABG shows respiratory alkalosis, will decrease vent rate and tidal volume.   CARDIOVASCULAR A: Cardiac aarest Cardiogenic shock Bradycardiac with HR in the 50s CHF P: Low dose levophed to maintain MAP>65 or SBP>90 Hemodynamic per ICU Will give atropin if HR LE 30  RENAL A: Renal Failure-most likely due to ATN-creatinine trending up P: -Nephrology following; plan is to start CRRT -follow chem 7 -follow Is/Os -continue Foley Catheter-  GASTROINTESTINAL A: No acute issues P: Famotidine IV bid  HEMATOLOGIC Anemia of chronic disease P: Follow CBC Transfuse if Hg<7  INFECTIOUS A: Urosepsis P: -Empric abx with zosyn -F/U cultures  ENDOCRINE A: Hypoglycemia P: - ICU hypoglycemic\Hyperglycemia protocol -Monitor blood glucose  NEUROLOGIC AMS High risk for anoxic brain injury  secondary to cardiac arrest P: -keep  intubated and sedated - minimal sedation to achieve a RASS goal: -1 -Awaiting neurology input  Disposition and family update: ICU status. Wife at bedside. Updated on current treatment plan.  Date: 11/03/2015  MRN# [id number] Aviraj Ells 04/25/1940   Demeatrice Kadir is a 75 y.o. old male seen in follow up for chief complaint of  Chief Complaint  Patient presents with  . Extremity Weakness   SUBJECTIVE: Abdomen mildly distended; blood pressure in the low 80s with MAPs in the 30s. Low dose levophed started. Rhythm change from NSR to intermittent SVT with PVCs.   Allergies:  No known allergies  Review of Systems: No able to provide due to critical illness.   Physical Examination:   VS: BP 97/69   Pulse 99   Temp 98.7 F (37.1 C) (Axillary)   Resp 20   Ht 5\' 8"  (1.727 m)   Wt 154 lb 5.2 oz (70 kg)   SpO2 100%   BMI 23.46 kg/m   General Appearance: unresponsive, grimaces.  Neuro:without focal findings, HEENT: PERRLA, EOM intact. Pulmonary: normal breath sounds, No wheezing.   CardiovascularNormal S1,S2.  No m/r/g.   Abdomen: Benign, Soft, non-tender. Renal:  No costovertebral tenderness  GU:  Not performed at this time. Endoc: No evident thyromegaly, no signs of acromegaly. Skin:   warm, no rash. Extremities: normal, no cyanosis, clubbing.   LABORATORY PANEL:   CBC  Recent Labs Lab 11/03/15 0147  WBC 4.4  HGB 7.9*  HCT 23.2*  PLT 80*   ------------------------------------------------------------------------------------------------------------------  Chemistries   Recent Labs Lab 11/03/15 0147  NA 138  K 4.1  CL 107  CO2 19*  GLUCOSE 81  BUN 91*  CREATININE 4.46*  CALCIUM 8.2*  MG 2.0  AST 48*  ALT 20  ALKPHOS 72  BILITOT 1.0   ------------------------------------------------------------------------------------------------------------------  Cardiac Enzymes  Recent Labs Lab 11/02/15 0543  TROPONINI  0.20*   Ct Head Wo Contrast  Result Date: 11/02/2015 CLINICAL DATA:  Status post cardiac arrest EXAM: CT HEAD WITHOUT CONTRAST TECHNIQUE: Contiguous axial images were obtained from the base of the skull through the vertex without intravenous contrast. COMPARISON:  MRI brain dated 06/08/2015 FINDINGS: Brain: No evidence of acute infarction, hemorrhage, hydrocephalus, extra-axial collection or mass lesion/mass effect. Vascular: No hyperdense vessel or unexpected calcification. Skull: No evidence of calvarial fracture. Sinuses/Orbits: The visualized paranasal sinuses are essentially clear. The mastoid air cells are unopacified. Other: Subcortical white matter and periventricular small vessel ischemic changes. Intracranial atherosclerosis. Global cortical atrophy.  No ventriculomegaly. IMPRESSION: No evidence of acute intracranial abnormality. Atrophy with small vessel ischemic changes. Electronically Signed   By: Julian Hy M.D.   On: 11/02/2015 09:42   Ct Cervical Spine Wo Contrast  Result Date: 11/02/2015 CLINICAL DATA:  Cardiac arrest, a systole for about 10 minutes. Neck pain. Intubated. EXAM: CT CERVICAL SPINE WITHOUT CONTRAST TECHNIQUE: Multidetector CT imaging of the cervical spine was performed without intravenous contrast. Multiplanar CT image reconstructions were also generated. COMPARISON:  Multiple exams, including 06/08/2015 MRI FINDINGS: Alignment: 1.5 mm degenerative anterolisthesis at C4-5. Skull base and vertebrae: Anterior spurring and loss of articular space at the C1-2 articulation. Multilevel degenerative facet arthropathy and anterior intervertebral spurring noted, with some completely bridging spurring on the right at C5-6. Loss of disc height at C5-6 with posterior osseous ridging. Several lucencies are present scattered in the vertebral bodies, possibly small hemangiomas but technically nonspecific. Soft tissues and spinal canal: Atherosclerotic calcification of the carotid bulbs  bilaterally. Disc protrusion at C4-5 and  disc bulge at C5-6. Disc levels: Cervical spondylosis causes osseous foraminal narrowing on the left at C3-4, C4-5, and C5-6; and on the right at C3-4, C4-5, and C5-6. Underlying degenerative disc disease likely contributes to the foraminal impingement at the C4-5 and C5-6 levels. Degenerative disc disease is believed to be causing at least moderate central narrowing of the thecal sac at C4-5 and C5-6. Upper chest: Right pleural effusion. Other: Generalized subcutaneous edema. IMPRESSION: 1. No cervical spine fracture or acute subluxation. 2. Cervical spondylosis and degenerative disc disease, causing impingement at C3-4, C4-5, and C5-6. 3. Right pleural effusion. 4. Subcutaneous edema. 5. Atherosclerotic calcification of the carotid bulbs bilaterally. Electronically Signed   By: Van Clines M.D.   On: 11/02/2015 17:10   US Venous Img Lower Bilateral  Result Date: 11/02/2015 CLINICAL DATA:  Bilateral lower extremity swelling. Short of breath for 1 day. EXAM: BILATERAL LOWER EXTREMITY VENOUS DUPLEX ULTRASOUND TECHNIQUE: Doppler venous assessment of the bilateral lower extremity deep venous system was performed, including characterization of spectral flow, compressibility, and phasicity. COMPARISON:  None. FINDINGS: There is complete compressibility of the bilateral common femoral, femoral, and popliteal veins. Doppler analysis demonstrates respiratory phasicity and augmentation of flow with calf compression. No obvious superficial vein or calf vein thrombosis. IMPRESSION: No evidence of lower extremity DVT. Electronically Signed   By: Marybelle Killings M.D.   On: 11/02/2015 15:58    Total CCM time=40 minutes  [employer] ANP-BC Pulmonary and Critical Care Medicine [facility] Pager [phone] or [phone]  11/03/2015

## 2015-11-03 NOTE — Procedures (Signed)
Central Venous Dailysis Catheter Placement: Indication: Hemo Dialysis/CRRT   Consent:obtained.   Risks and benefits explained in detail including risk of infection, bleeding, respiratory failure and death..   Hand washing performed prior to starting the procedure.   Procedure: An active timeout was performed and correct patient, name, & ID confirmed.  After explaining risk and benefits, patient was positioned correctly for central venous access. Patient was prepped using strict sterile technique including chlorohexadine preps, sterile drape, sterile gown and sterile gloves.  The area was prepped, draped and anesthetized in the usual sterile manner. Patient comfort was obtained with premedication with versed 2 mg and fentanyl 50 mcg bolus.  A triple lumen HD catheter was placed in right femoral Vein There was good blood return, catheter caps were placed on lumens, catheter flushed easily, the line was secured and a sterile dressing and BIO-PATCH applied.   Ultrasound was used to visualize vasculature and guidance of needle.   Number of Attempts: 1 Complications:none  Estimated Blood Loss: 20 mL    Deep Ashby Dawes, M.D. 11/03/2015

## 2015-11-04 ENCOUNTER — Inpatient Hospital Stay: Payer: Medicare HMO

## 2015-11-04 LAB — RENAL FUNCTION PANEL
ALBUMIN: 2.8 g/dL — AB (ref 3.5–5.0)
ALBUMIN: 2.7 g/dL — AB (ref 3.5–5.0)
ALBUMIN: 2.8 g/dL — AB (ref 3.5–5.0)
ANION GAP: 8 (ref 5–15)
ANION GAP: 8 (ref 5–15)
Albumin: 2.8 g/dL — ABNORMAL LOW (ref 3.5–5.0)
Albumin: 2.9 g/dL — ABNORMAL LOW (ref 3.5–5.0)
Albumin: 2.8 g/dL — ABNORMAL LOW (ref 3.5–5.0)
Anion gap: 7 (ref 5–15)
Anion gap: 8 (ref 5–15)
Anion gap: 10 (ref 5–15)
Anion gap: 9 (ref 5–15)
BUN: 48 mg/dL — ABNORMAL HIGH (ref 6–20)
BUN: 40 mg/dL — ABNORMAL HIGH (ref 6–20)
BUN: 32 mg/dL — ABNORMAL HIGH (ref 6–20)
BUN: 29 mg/dL — AB (ref 6–20)
BUN: 26 mg/dL — AB (ref 6–20)
BUN: 23 mg/dL — ABNORMAL HIGH (ref 6–20)
CALCIUM: 8.3 mg/dL — AB (ref 8.9–10.3)
CALCIUM: 8.3 mg/dL — AB (ref 8.9–10.3)
CHLORIDE: 107 mmol/L (ref 101–111)
CHLORIDE: 106 mmol/L (ref 101–111)
CO2: 25 mmol/L (ref 22–32)
CO2: 25 mmol/L (ref 22–32)
CO2: 24 mmol/L (ref 22–32)
CO2: 22 mmol/L (ref 22–32)
CO2: 23 mmol/L (ref 22–32)
CO2: 25 mmol/L (ref 22–32)
CREATININE: 2.19 mg/dL — AB (ref 0.61–1.24)
CREATININE: 1.59 mg/dL — AB (ref 0.61–1.24)
CREATININE: 1.32 mg/dL — AB (ref 0.61–1.24)
Calcium: 8.3 mg/dL — ABNORMAL LOW (ref 8.9–10.3)
Calcium: 8.3 mg/dL — ABNORMAL LOW (ref 8.9–10.3)
Calcium: 8.4 mg/dL — ABNORMAL LOW (ref 8.9–10.3)
Calcium: 8.3 mg/dL — ABNORMAL LOW (ref 8.9–10.3)
Chloride: 107 mmol/L (ref 101–111)
Chloride: 106 mmol/L (ref 101–111)
Chloride: 106 mmol/L (ref 101–111)
Chloride: 106 mmol/L (ref 101–111)
Creatinine, Ser: 1.91 mg/dL — ABNORMAL HIGH (ref 0.61–1.24)
Creatinine, Ser: 1.48 mg/dL — ABNORMAL HIGH (ref 0.61–1.24)
Creatinine, Ser: 1.21 mg/dL (ref 0.61–1.24)
GFR calc Af Amer: 38 mL/min — ABNORMAL LOW (ref >60)
GFR calc Af Amer: 52 mL/min — ABNORMAL LOW (ref >60)
GFR calc Af Amer: 59 mL/min — ABNORMAL LOW (ref >60)
GFR calc Af Amer: >60 mL/min (ref >60)
GFR calc non Af Amer: 28 mL/min — ABNORMAL LOW (ref >60)
GFR calc non Af Amer: 45 mL/min — ABNORMAL LOW (ref >60)
GFR calc non Af Amer: 51 mL/min — ABNORMAL LOW (ref >60)
GFR calc non Af Amer: 57 mL/min — ABNORMAL LOW (ref >60)
GFR, EST AFRICAN AMERICAN: 32 mL/min — AB (ref >60)
GFR, EST AFRICAN AMERICAN: 47 mL/min — AB (ref >60)
GFR, EST NON AFRICAN AMERICAN: 33 mL/min — AB (ref >60)
GFR, EST NON AFRICAN AMERICAN: 41 mL/min — AB (ref >60)
GLUCOSE: 109 mg/dL — AB (ref 65–99)
GLUCOSE: 99 mg/dL (ref 65–99)
GLUCOSE: 124 mg/dL — AB (ref 65–99)
GLUCOSE: 85 mg/dL (ref 65–99)
Glucose, Bld: 126 mg/dL — ABNORMAL HIGH (ref 65–99)
Glucose, Bld: 106 mg/dL — ABNORMAL HIGH (ref 65–99)
PHOSPHORUS: 2.3 mg/dL — AB (ref 2.5–4.6)
PHOSPHORUS: 3.9 mg/dL (ref 2.5–4.6)
PHOSPHORUS: 3.4 mg/dL (ref 2.5–4.6)
POTASSIUM: 4 mmol/L (ref 3.5–5.1)
POTASSIUM: 4.7 mmol/L (ref 3.5–5.1)
Phosphorus: 2.6 mg/dL (ref 2.5–4.6)
Phosphorus: 3.4 mg/dL (ref 2.5–4.6)
Phosphorus: 3.5 mg/dL (ref 2.5–4.6)
Potassium: 4.1 mmol/L (ref 3.5–5.1)
Potassium: 4.6 mmol/L (ref 3.5–5.1)
Potassium: 4.9 mmol/L (ref 3.5–5.1)
Potassium: 5.1 mmol/L (ref 3.5–5.1)
SODIUM: 138 mmol/L (ref 135–145)
SODIUM: 138 mmol/L (ref 135–145)
Sodium: 139 mmol/L (ref 135–145)
Sodium: 140 mmol/L (ref 135–145)
Sodium: 138 mmol/L (ref 135–145)
Sodium: 139 mmol/L (ref 135–145)

## 2015-11-04 LAB — HEPARIN LEVEL (UNFRACTIONATED): Heparin Unfractionated: 0.32 IU/mL (ref 0.30–0.70)

## 2015-11-04 LAB — GLUCOSE, CAPILLARY
GLUCOSE-CAPILLARY: 90 mg/dL (ref 65–99)
GLUCOSE-CAPILLARY: 83 mg/dL (ref 65–99)
Glucose-Capillary: 91 mg/dL (ref 65–99)
Glucose-Capillary: 82 mg/dL (ref 65–99)
Glucose-Capillary: 79 mg/dL (ref 65–99)

## 2015-11-04 LAB — CBC
HCT: 24.5 % — ABNORMAL LOW (ref 40.0–52.0)
Hemoglobin: 8.3 g/dL — ABNORMAL LOW (ref 13.0–18.0)
MCH: 31.6 pg (ref 26.0–34.0)
MCHC: 33.9 g/dL (ref 32.0–36.0)
MCV: 93.1 fL (ref 80.0–100.0)
PLATELETS: 105 10*3/uL — AB (ref 150–440)
RBC: 2.63 MIL/uL — AB (ref 4.40–5.90)
RDW: 15.4 % — ABNORMAL HIGH (ref 11.5–14.5)
WBC: 6.7 10*3/uL (ref 3.8–10.6)

## 2015-11-04 LAB — PHOSPHORUS: Phosphorus: 2.1 mg/dL — ABNORMAL LOW (ref 2.5–4.6)

## 2015-11-04 LAB — MAGNESIUM: Magnesium: 1.7 mg/dL (ref 1.7–2.4)

## 2015-11-04 MED ORDER — DEXTROSE 10 % IV SOLN
INTRAVENOUS | Status: DC
  Administered 2015-11-04: 22:00:00 via INTRAVENOUS

## 2015-11-04 MED ORDER — POTASSIUM PHOSPHATES 15 MMOLE/5ML IV SOLN
30.0000 mmol | Freq: Once | INTRAVENOUS | Status: AC
  Administered 2015-11-04: 30 mmol via INTRAVENOUS
  Filled 2015-11-04: qty 10

## 2015-11-04 MED ORDER — SODIUM CHLORIDE 0.9% FLUSH
10.0000 mL | Freq: Two times a day (BID) | INTRAVENOUS | Status: DC
  Administered 2015-11-04 – 2015-11-09 (×10): 10 mL

## 2015-11-04 MED ORDER — SODIUM CHLORIDE 0.9% FLUSH: 10.0000 mL | INTRAVENOUS | Status: DC | PRN

## 2015-11-04 NOTE — Progress Notes (Signed)
[facility]  ROUNDING NOTE   Subjective:  We will function worse at the moment. Urine output over the preceding 24 hours was 329 cc. Patient appears to be tolerating CRRT well. Weaning from the ventilator was attempted this a.m. but thus far unsuccessful.   Objective:  Vital signs in last 24 hours:  Temp:  [96 F (35.6 C)-98.8 F (37.1 C)] 96.2 F (35.7 C) (09/10 0800) Pulse Rate:  [34-140] 68 (09/10 1000) Resp:  [14-23] 15 (09/10 1000) BP: (76-117)/(55-77) 91/62 (09/10 1000) SpO2:  [93 %-100 %] 100 % (09/10 1000) FiO2 (%):  [24 %] 24 % (09/10 0900) Weight:  [72.8 kg (160 lb 7.9 oz)] 72.8 kg (160 lb 7.9 oz) (09/10 0500)  Weight change: -1.3 kg (-2 lb 13.9 oz) Filed Weights   11/02/15 0500 11/03/15 0500 11/04/15 0500  Weight: 70 kg (154 lb 5.2 oz) 74.1 kg (163 lb 5.8 oz) 72.8 kg (160 lb 7.9 oz)    Intake/Output: I/O last 3 completed shifts: In: 5656.5 [I.V.:2829.5; NG/GT:915; IV Piggyback:1912] Out: 2694 [Urine:564; Emesis/NG output:480]   Intake/Output this shift:  Total I/O In: 45.5 [I.V.:45.5] Out: 15 [Urine:15]  Physical Exam: General: Critically ill appearing  Head: ETT/OG in place  Eyes: Anicteric  Neck: Supple, trachea midline  Lungs:  Bilateral rhonchi, normal effort  Heart: S1S2 no rubs  Abdomen:  Soft, nontender, BS present  Extremities: trace peripheral edema.  Neurologic: Arousable, did nod yes to a question  Skin: No lesions  Access:     Basic Metabolic Panel:  Recent Labs Lab 11/02/15 0543 11/02/15 1714 11/03/15 0147  11/03/15 1347 11/03/15 1801 11/03/15 2151 11/04/15 0203 11/04/15 0520  NA 139  --  138  < > 138 138 139 139 140  K 4.7  --  4.1  < > 3.5 3.7 3.8 4.1 4.0  CL 106  --  107  < > 109 107 107 107 107  CO2 20*  --  19*  < > 20* _0 GLUCOSE 84  --  81  < > 119* 130* 109* 109* 99  BUN 93*  --  91*  < > 88* 67* 55* 48* 40*  CREATININE 4.22*  --  4.46*  < > 4.26* 3.36* 2.64* 2.19* 1.91*  CALCIUM 8.6*  --   8.2*  < > 8.0* 8.1* 8.3* 8.3* 8.3*  MG 2.1 2.0 2.0  --   --  2.0  --   --  1.7  PHOS 5.1* 4.3 4.0  < > 3.4 3.1  3.2 2.7 2.6 2.1*  2.3*  < > = values in this interval not displayed.  Liver Function Tests:  Recent Labs Lab 11/02/15 0543 11/03/15 0147  11/03/15 1347 11/03/15 1801 11/03/15 2151 11/04/15 0203 11/04/15 0520  AST 38 48*  --   --   --   --   --   --   ALT 18 20  --   --   --   --   --   --   ALKPHOS 81 72  --   --   --   --   --   --   BILITOT 0.7 1.0  --   --   --   --   --   --   PROT 5.3* 5.2*  --   --   --   --   --   --   ALBUMIN 3.0* 2.8*  < > 2.7* 2.7* 2.7* 2.8* 2.8*  < > =  values in this interval not displayed. No results for input(s): LIPASE, AMYLASE in the last 168 hours. No results for input(s): AMMONIA in the last 168 hours.  CBC:  Recent Labs Lab 11/23/2015 1011 11/02/15 0543 11/03/15 0147 11/04/15 0520  WBC 3.7* 4.1 4.4 6.7  HGB 8.6* 8.0* 7.9* 8.3*  HCT 25.5* 23.6* 23.2* 24.5*  MCV 93.9 94.4 92.6 93.1  PLT 87* 74* 80* 105*    Cardiac Enzymes:  Recent Labs Lab 11/06/2015 1011 11/08/2015 1717 11/07/2015 2218 11/02/15 0543  TROPONINI 0.16* 0.14* 0.13* 0.20*    BNP: Invalid input(s): POCBNP  CBG:  Recent Labs Lab 11/03/15 1816 11/03/15 1945 11/03/15 2350 11/04/15 0351 11/04/15 0757  GLUCAP 118* 114* 104* 90 31    Microbiology: Results for orders placed or performed during the hospital encounter of 11/17/2015  Urine culture     Status: Abnormal   Collection Time: 11/11/2015 10:11 AM  Result Value Ref Range Status   Specimen Description URINE, RANDOM  Final   Special Requests NONE  Final   Culture >=100,000 COLONIES/mL KLEBSIELLA PNEUMONIAE (A)  Final   Report Status 11/03/2015 FINAL  Final   Organism ID, Bacteria KLEBSIELLA PNEUMONIAE (A)  Final      Susceptibility   Klebsiella pneumoniae - MIC*    AMPICILLIN >=32 RESISTANT Resistant     CEFAZOLIN <=4 SENSITIVE Sensitive     CEFTRIAXONE <=1 SENSITIVE Sensitive     CIPROFLOXACIN  <=0.25 SENSITIVE Sensitive     GENTAMICIN <=1 SENSITIVE Sensitive     IMIPENEM <=0.25 SENSITIVE Sensitive     NITROFURANTOIN <=16 SENSITIVE Sensitive     TRIMETH/SULFA <=20 SENSITIVE Sensitive     AMPICILLIN/SULBACTAM 4 SENSITIVE Sensitive     PIP/TAZO <=4 SENSITIVE Sensitive     Extended ESBL NEGATIVE Sensitive     * >=100,000 COLONIES/mL KLEBSIELLA PNEUMONIAE  Culture, blood (routine x 2)     Status: None (Preliminary result)   Collection Time: 11/05/2015 11:13 AM  Result Value Ref Range Status   Specimen Description BLOOD RIGHT AC  Final   Special Requests   Final    BOTTLES DRAWN AEROBIC AND ANAEROBIC ANA 8ML AER 10ML   Culture NO GROWTH 3 DAYS  Final   Report Status PENDING  Incomplete  Culture, blood (routine x 2)     Status: None (Preliminary result)   Collection Time: 11/13/2015 11:13 AM  Result Value Ref Range Status   Specimen Description BLOOD LEFT ARM  Final   Special Requests BOTTLES DRAWN AEROBIC AND ANAEROBIC [place]  Final   Culture NO GROWTH 3 DAYS  Final   Report Status PENDING  Incomplete  Culture, blood (routine x 2)     Status: None (Preliminary result)   Collection Time: 11/02/15  2:17 AM  Result Value Ref Range Status   Specimen Description BLOOD RIGHT HAND  Final   Special Requests BOTTLES DRAWN AEROBIC AND ANAEROBIC 5CC  Final   Culture NO GROWTH 2 DAYS  Final   Report Status PENDING  Incomplete  Culture, blood (routine x 2)     Status: None (Preliminary result)   Collection Time: 11/02/15  2:28 AM  Result Value Ref Range Status   Specimen Description BLOOD LEFT HAND  Final   Special Requests BOTTLES DRAWN AEROBIC AND ANAEROBIC [place]  Final   Culture NO GROWTH 2 DAYS  Final   Report Status PENDING  Incomplete  MRSA PCR Screening     Status: None   Collection Time: 11/02/15 10:42 AM  Result Value Ref  Range Status   MRSA by PCR NEGATIVE NEGATIVE Final    Comment:        The GeneXpert MRSA Assay (FDA approved for NASAL specimens only), is one component of  a comprehensive MRSA colonization surveillance program. It is not intended to diagnose MRSA infection nor to guide or monitor treatment for MRSA infections.     Coagulation Studies: No results for input(s): LABPROT, INR in the last 72 hours.  Urinalysis:  Recent Labs  11/04/2015 1415  COLORURINE AMBER*  LABSPEC 1.012  PHURINE 5.0  GLUCOSEU NEGATIVE  HGBUR 3+*  BILIRUBINUR NEGATIVE  KETONESUR NEGATIVE  PROTEINUR 100*  NITRITE NEGATIVE  LEUKOCYTESUR 3+*      Imaging: Dg Chest 1 View  Result Date: 11/03/2015 CLINICAL DATA:  Patient with history of shortness of breath and abdominal distension. EXAM: CHEST 1 VIEW COMPARISON:  Chest radiograph 11/02/2015. FINDINGS: Patient is rotated to the left. ET tube terminates in the mid trachea. Enteric tube courses inferior to the diaphragm. Pacer apparatus overlies the mediastinum. Monitoring leads overlie the patient. Stable cardiomegaly. Small bilateral layering pleural effusions, right-greater-than-left. Underlying pulmonary opacities. No pneumothorax. IMPRESSION: ETT terminates in the mid trachea. Cardiomegaly. Right-greater-than-left layering pleural effusions with underlying opacities. Electronically Signed   By: Lovey Newcomer M.D.   On: 11/03/2015 07:27   Dg Abd 1 View  Result Date: 11/03/2015 CLINICAL DATA:  Patient with shortness of breath and abdominal distension. History of stroke. EXAM: ABDOMEN - 1 VIEW COMPARISON:  Abdominal radiograph 11/02/2015 FINDINGS: ET tube tip and side-port project over the stomach. Pacer apparatus overlies the mediastinum. Multiple monitoring leads overlie the patient. Nonobstructed bowel gas pattern. Small to moderate right pleural effusion. Underlying pulmonary opacities. IMPRESSION: ET tube tip and side-port project in the stomach. Nonobstructed bowel gas pattern. Electronically Signed   By: Lovey Newcomer M.D.   On: 11/03/2015 07:41   Ct Cervical Spine Wo Contrast  Result Date: 11/02/2015 CLINICAL DATA:   Cardiac arrest, a systole for about 10 minutes. Neck pain. Intubated. EXAM: CT CERVICAL SPINE WITHOUT CONTRAST TECHNIQUE: Multidetector CT imaging of the cervical spine was performed without intravenous contrast. Multiplanar CT image reconstructions were also generated. COMPARISON:  Multiple exams, including 06/08/2015 MRI FINDINGS: Alignment: 1.5 mm degenerative anterolisthesis at C4-5. Skull base and vertebrae: Anterior spurring and loss of articular space at the C1-2 articulation. Multilevel degenerative facet arthropathy and anterior intervertebral spurring noted, with some completely bridging spurring on the right at C5-6. Loss of disc height at C5-6 with posterior osseous ridging. Several lucencies are present scattered in the vertebral bodies, possibly small hemangiomas but technically nonspecific. Soft tissues and spinal canal: Atherosclerotic calcification of the carotid bulbs bilaterally. Disc protrusion at C4-5 and disc bulge at C5-6. Disc levels: Cervical spondylosis causes osseous foraminal narrowing on the left at C3-4, C4-5, and C5-6; and on the right at C3-4, C4-5, and C5-6. Underlying degenerative disc disease likely contributes to the foraminal impingement at the C4-5 and C5-6 levels. Degenerative disc disease is believed to be causing at least moderate central narrowing of the thecal sac at C4-5 and C5-6. Upper chest: Right pleural effusion. Other: Generalized subcutaneous edema. IMPRESSION: 1. No cervical spine fracture or acute subluxation. 2. Cervical spondylosis and degenerative disc disease, causing impingement at C3-4, C4-5, and C5-6. 3. Right pleural effusion. 4. Subcutaneous edema. 5. Atherosclerotic calcification of the carotid bulbs bilaterally. Electronically Signed   By: Van Clines M.D.   On: 11/02/2015 17:10   US Venous Img Lower Bilateral  Result Date: 11/02/2015  CLINICAL DATA:  Bilateral lower extremity swelling. Short of breath for 1 day. EXAM: BILATERAL LOWER EXTREMITY  VENOUS DUPLEX ULTRASOUND TECHNIQUE: Doppler venous assessment of the bilateral lower extremity deep venous system was performed, including characterization of spectral flow, compressibility, and phasicity. COMPARISON:  None. FINDINGS: There is complete compressibility of the bilateral common femoral, femoral, and popliteal veins. Doppler analysis demonstrates respiratory phasicity and augmentation of flow with calf compression. No obvious superficial vein or calf vein thrombosis. IMPRESSION: No evidence of lower extremity DVT. Electronically Signed   By: Marybelle Killings M.D.   On: 11/02/2015 15:58   Dg Chest Port 1 View  Result Date: 11/04/2015 CLINICAL DATA:  Respiratory failure EXAM: PORTABLE CHEST 1 VIEW COMPARISON:  11/03/2015 FINDINGS: Cardiomegaly is noted. Central mild vascular congestion without convincing pulmonary edema. Stable endotracheal and NG tube position. There is small right pleural effusion on with right basilar atelectasis or infiltrate. Trace left pleural effusion with left basilar atelectasis. Status post median sternotomy. IMPRESSION: Stable support apparatus. There is small right pleural effusion on with right basilar atelectasis or infiltrate. Trace left pleural effusion with left basilar atelectasis. Electronically Signed   By: Lahoma Crocker M.D.   On: 11/04/2015 10:11   Dg Abd Portable 1v  Result Date: 11/04/2015 CLINICAL DATA:  Generalized abdominal pain and distention. EXAM: PORTABLE ABDOMEN - 1 VIEW COMPARISON:  Radiographs of November 03, 2015. FINDINGS: No small bowel dilatation is noted. Air-filled colon is noted. There appears to be a dilated loop of sigmoid colon which may represent ileus. Distal tip of nasogastric tube is seen in stomach. Postsurgical changes are seen in epigastric region. Stool is noted in the rectum. IMPRESSION: Dilated loop of sigmoid colon is noted which may simply represent ileus, but continued radiographic follow-up is recommended to rule out obstruction.  No small bowel dilatation is noted. Electronically Signed   By: Marijo Conception, M.D.   On: 11/04/2015 08:23     Medications:   . sodium chloride 50 mL/hr at 11/04/15 0700  . fentaNYL infusion INTRAVENOUS 100 mcg/hr (11/04/15 1024)  . heparin 1,100 Units/hr (11/04/15 0800)  . norepinephrine (LEVOPHED) Adult infusion 10.027 mcg/min (11/04/15 0700)  . pureflow 3 each (11/03/15 1825)   . aspirin  81 mg Oral Daily  . atorvastatin  10 mg Oral q1800  . chlorhexidine  15 mL Mouth Rinse BID  . famotidine (PEPCID) IV  20 mg Intravenous Q24H  . feeding supplement (PRO-STAT SUGAR FREE 64)  30 mL Per Tube BID  . feeding supplement (VITAL HIGH PROTEIN)  1,000 mL Per Tube Q24H  . fentaNYL  50 mcg Intravenous Once  . isosorbide mononitrate  240 mg Oral Daily  . mouth rinse  15 mL Mouth Rinse Q2H  . metoCLOPramide (REGLAN) injection  20 mg Intravenous Q8H  . piperacillin-tazobactam (ZOSYN)  IV  3.375 g Intravenous Q8H  . polyethylene glycol  17 g Oral BID  . potassium phosphate IVPB (mmol)  30 mmol Intravenous Once  . sennosides  10 mL Oral BID  . sodium chloride flush  3 mL Intravenous Q12H  . sodium chloride flush  3 mL Intravenous Q12H  . tamsulosin  0.4 mg Oral Daily   sodium chloride, sodium chloride, acetaminophen, fentaNYL, heparin, midazolam, ondansetron (ZOFRAN) IV, sodium chloride flush  Assessment/ Plan:  75 y.o. male with a PMHx of severe coronary artery disease, Congestive heart failure ejection fraction 30-35%, chronic kidney disease stage IV followed by [facility] nephrology, degenerative joint disease, diabetes mellitus type 2, GERD, gout,  hyperlipidemia, hypertension, prior history of myocardial infarction, history of CVA, who was admitted to [facility] on 11/08/2015 for evaluation of weakness.  Patient suffered cardiopulmonary arrest shortly after being admitted.  1. Acute renal failure/chronic kidney disease stage IV baseline creatinine 2.3 EGFR 29 followed by [facility] nephrology. The patient has  a baseline creatinine of 2.3 with an EGFR 29. Acute renal failure now likely related to sepsis, cardiopulmonary arrest, and hypotension.  - Patient appears to be tolerating continuous renal replacement therapy well as far. Urine output remains in the oliguric range therefore we will continue CRRT. In addition we will hold off on adding ultrafiltration for now as the patient's blood pressure remains relatively low. Consider adding ultrafiltration tomorrow as tolerated.   2. Acute respiratory failure. Likely secondary to cardiopulmonary arrest.  - Was attempted this a.m. however patient will remain on the ventilator through today.  3. Anemia of chronic kidney disease. Hemoglobin remains low at 8.3 but slightly higher than before. Continue to monitor and provide low threshold for transfusion.  4. Hypotension.  Continue norepinephrine drip to maintain a map of 65.   LOS: 3 Traeh Milroy 9/10/201710:28 AM

## 2015-11-04 NOTE — Progress Notes (Addendum)
* [facility]     ASSESSMENT / PLAN: 75 yo AAM with systolic CHF [id number], multiple medical issues admitted for sepsis from UTI with acidosis>> ICU due to to acute cardiac arrest now with ARF on CRRT.   PULMONARY A: Acute Respiratory Failure 2/2 cardiac arrest Pt is more awake and alert this morning, answering questions, nodding yes and now. Appears to breathing comfortably on the vent.  --Vent: PRVC/12/450/5/24% P: -Pt is stable on vent, however he continues on CRRT and pressor support. -Continue Bronchodilator Therapy -Repeat ABG and CXR in am.   CARDIOVASCULAR A: Cardiac aarest Cardiogenic shock Bradycardiac with HR in the 50s-HR improved to NSR in the 60s CHF P: -Continue low dose levophed to maintain MAP>65 or SBP>90 -Hemodynamic per ICU -PRN atropin if HR LE 30  RENAL A: Pt had CKD before admission, not with ARF.  P: -Nephrology following; continue CRRT per nephro. -follow chem 7 -follow Is/Os -continue Foley Catheter  GASTROINTESTINAL A: Increasing abd distension seen over past few days, review of imaging shows dilated bowel loops. This is likely c/w ileus from recent cardiac arrest.  P: Stop tube feeds, continue NGT to suction for bowel rest.  Famotidine IV bid  HEMATOLOGIC Anemia of chronic disease P: Follow CBC Transfuse if Hg<7  INFECTIOUS A: Urosepsis --CXR suggestive of right lung pneumonia vs. Atelectasis.  P: -Empric abx with zosyn -F/U cultures  ENDOCRINE A: Hypoglycemia P: -Monitor blood glucose  NEUROLOGIC AMS High risk for anoxic brain injury secondary to cardiac arrest P: -Keep  intubated and sedated -Minimal sedation to achieve a RASS goal: -1 -Awaiting neurology input  Disposition and family update: ICU status. Wife at bedside. Updated on current treatment plan.   Advanced care planning note 11/03/15: Patient's condition was discussed with the family including the patient's wife and his  sisters, patient's nurse, nephrology was also in attendance. We explained that the patient's prognosis is poor, given his multiorgan failure, which includes cardiac arrest, chronic systolic congestive heart failure with EF 35%, acute on chronic kidney disease, acute hypoxic respiratory failure, ventilator dependent, as well as hypotension. We explained that should the patient's heart stopped once again to be unlikely that we could get him back and he would have a good chance of meaningful recovery. Explained the risks and benefits of placing the patient on a dialysis machine, as well as placement of a dialysis catheter. This will discuss the likelihood that the patient will be on hemodialysis permanently. They appeared to understand the risks and benefits, they are not ready to transition to comfort measures only and would like to continue all measures including keeping the patient full code. We'll therefore proceed with the placement of hemodialysis catheter, initiate dialysis. Continue to monitor the patient as per patient's family's wishes. 30 min spent in advance care planning.  -Deep Marsela Kuan 11/04/2015   Date: 11/04/2015  MRN# [id number] Robert Parks 04-29-1940   Robert Parks is a 75 y.o. old male seen in follow up for chief complaint of  Chief Complaint  Patient presents with  . Extremity Weakness   SUBJECTIVE:CRRT overnight with significant improvement in lab indices. Still requiring low dose pressors.   Allergies:  No known allergies  Review of Systems: No able to provide due to critical illness.   Physical Examination:   VS: BP 90/61   Pulse 64   Temp 97.4 F (36.3 C) (Axillary)   Resp 15   Ht 5\' 8"  (1.727 m)   Wt 160 lb 7.9 oz (72.8  kg)   SpO2 100%   BMI 24.40 kg/m   General Appearance: unresponsive, grimaces.  Neuro:without focal findings, HEENT: PERRLA, EOM intact. Pulmonary: normal breath sounds, No wheezing.   CardiovascularNormal S1,S2.  No m/r/g.   Abdomen:  Benign, Soft, non-tender. Renal:  No costovertebral tenderness  GU:  Not performed at this time. Endoc: No evident thyromegaly, no signs of acromegaly. Skin:   warm, no rash. Extremities: normal, no cyanosis, clubbing.   LABORATORY PANEL:   CBC  Recent Labs Lab 11/04/15 0520  WBC 6.7  HGB 8.3*  HCT 24.5*  PLT 105*   ------------------------------------------------------------------------------------------------------------------  Chemistries   Recent Labs Lab 11/03/15 0147  11/04/15 0520  NA 138  < > 140  K 4.1  < > 4.0  CL 107  < > 107  CO2 19*  < > 25  GLUCOSE 81  < > 99  BUN 91*  < > 40*  CREATININE 4.46*  < > 1.91*  CALCIUM 8.2*  < > 8.3*  MG 2.0  < > 1.7  AST 48*  --   --   ALT 20  --   --   ALKPHOS 72  --   --   BILITOT 1.0  --   --   < > = values in this interval not displayed. ------------------------------------------------------------------------------------------------------------------  Cardiac Enzymes  Recent Labs Lab 11/02/15 0543  TROPONINI 0.20*   No results found.  Total CCM time=35 minutes  Magdalene S. [facility] ANP-BC Pulmonary and Critical Care Medicine [facility] Pager [phone] or [phone]  11/04/2015   Pt seen and examined with NP, agree with plan. Acute respiratory failure with acute renal failure, complicated by Ileus s/p cardiac arrest-- pt not cooled due to comorbidities. Now appears to be waking up, lung CTA B;  but not sure that he is ready for extubation given continue hypotension and CRRT.  Continue CRRT per nephro, wean down pressors as tolerated. Hold tube feeds and keep NGT to suction for bowel rest.   Marda Stalker, M.D.  11/04/2015   Critical Care Attestation.  I have personally obtained a history, examined the patient, evaluated laboratory and imaging results, formulated the assessment and plan and placed orders. The Patient requires high complexity decision making for assessment and support,  frequent evaluation and titration of therapies, application of advanced monitoring technologies and extensive interpretation of multiple databases. The patient has critical illness that could lead imminently to failure of 1 or more organ systems and requires the highest level of physician preparedness to intervene.  Critical Care Time devoted to patient care services described in this note is 35  minutes and is exclusive of time spent in procedures.

## 2015-11-04 NOTE — Progress Notes (Signed)
Dr's Ram and Latiff at bedside speaking with family.  Family is convinced patient will get better, despite doctors portraying grave prognosis.

## 2015-11-04 NOTE — Progress Notes (Signed)
Nutrition Follow-up  DOCUMENTATION CODES:   Not applicable  INTERVENTION:  -If we are to restart TF's begin VHP @ 72mL/hr, provides 1680 calories, 147gm protein, and 1446mL free water   NUTRITION DIAGNOSIS:   Inadequate oral intake related to acute illness, poor appetite as evidenced by NPO status, per patient/family report. -ongoing  GOAL:   Patient will meet greater than or equal to 90% of their needs -not meeting  MONITOR:   Vent status, Labs, Weight trends  REASON FOR ASSESSMENT:   Ventilator, Consult Poor PO  ASSESSMENT:    75 yo male with history of angina, arthritis, CHF with ejection fraction 35%, CKD, was brought to ER with generalized weakness, hypotension, lactic acid high with leukocytosis. Patient went unresponsive, Code Blue called with PEA and asystole for approx 10 means, cervical neck fxs, sacral decub ulcer, now intubated on full vent support  Patient is currently intubated on ventilator support MV: 7.3 L/min Temp (24hrs), Avg:97 F (36.1 C), Min:96 F (35.6 C), Max:98.8 F (37.1 C) Currently on CRRT TFs were began 9/9, subsequently held due to Ileus consistent with cardic arrest Wt up 6 kgs since admission Unable to complete Nutrition-Focused physical exam at this time.  Unable to wean from vent at this time. Labs and Medications reviewed: Fentanyl Drip, Levo Drip, Reglan, Miralax/Glycolax, Senokot  Diet Order:  Diet NPO time specified  Skin:  Wound (see comment) (stage II pressure ulcer on buttock)  Last BM:  9/7  Height:   Ht Readings from Last 1 Encounters:  11/08/2015 5\' 8"  (1.727 m)    Weight:   Wt Readings from Last 1 Encounters:  11/04/15 160 lb 7.9 oz (72.8 kg)    Ideal Body Weight:  70 kg  BMI:  Body mass index is 24.4 kg/m.  Estimated Nutritional Needs:   Kcal:  1625 kcals  Protein:  105-140 g   Fluid:  >/= 1.6 L  EDUCATION NEEDS:   No education needs identified at this time  Satira Anis. Sheridan Hew, MS, RD  LDN Inpatient Clinical Dietitian Pager [phone]

## 2015-11-04 NOTE — Progress Notes (Signed)
CCMD reports run of Vtach.   Dr. Juanell Fairly aware.

## 2015-11-04 NOTE — Progress Notes (Addendum)
ANTICOAGULATION CONSULT NOTE - Initial Consult  Pharmacy Consult for Heparin  Indication: chest pain/ACS  Allergies  Allergen Reactions  . No Known Allergies Other (See Comments)    Patient Measurements: Height: 5\' 8"  (172.7 cm) Weight: 163 lb 5.8 oz (74.1 kg) IBW/kg (Calculated) : 68.4 Heparin Dosing Weight: 64 kg   Vital Signs: Temp: 96 F (35.6 C) (09/09 2000) Temp Source: Axillary (09/09 2000) BP: 96/66 (09/09 2345) Pulse Rate: 65 (09/09 2345)  Labs:  Recent Labs  11/02/2015 1011 11/03/2015 1717 10/31/2015 2218 11/02/15 0543 11/03/15 0147  11/03/15 1347 11/03/15 1801 11/03/15 2151  HGB 8.6*  --   --  8.0* 7.9*  --   --   --   --   HCT 25.5*  --   --  23.6* 23.2*  --   --   --   --   PLT 87*  --   --  74* 80*  --   --   --   --   HEPARINUNFRC  --   --   --   --  0.18*  --  0.22*  --  0.41  CREATININE 4.42*  --   --  4.22* 4.46*  < > 4.26* 3.36* 2.64*  TROPONINI 0.16* 0.14* 0.13* 0.20*  --   --   --   --   --   < > = values in this interval not displayed.  Estimated Creatinine Clearance: 23.4 mL/min (by C-G formula based on SCr of 2.64 mg/dL).   Medical History: Past Medical History:  Diagnosis Date  . Anginal pain ([place])    had angina 2-3 mths prior to visit  . Arthritis   . CHF (congestive heart failure) ([place])   . Chronic kidney disease    follwed by Nephrologist Dr Juanito Doom in [place], Stage 3  . Coronary artery disease   . DDD (degenerative disc disease), cervical   . Diabetes mellitus without complication (HCC)    DIET CONTROLLED   . Dysphagia   . Frequent urination   . GERD (gastroesophageal reflux disease)   . Gout   . Hyperlipidemia   . Hypertension   . MI (myocardial infarction) ([place])   . Orthopnea   . Pancytopenia ([place])   . Shortness of breath dyspnea    at rest, mainly when he's active  . Stroke [facility])    ?? small stroke om 2017  came and went    Medications:  Scheduled:  . aspirin  81 mg Oral Daily  . atorvastatin  10 mg Oral  q1800  . chlorhexidine  15 mL Mouth Rinse BID  . famotidine (PEPCID) IV  20 mg Intravenous Q24H  . feeding supplement (PRO-STAT SUGAR FREE 64)  30 mL Per Tube BID  . feeding supplement (VITAL HIGH PROTEIN)  1,000 mL Per Tube Q24H  . fentaNYL  50 mcg Intravenous Once  . isosorbide mononitrate  240 mg Oral Daily  . mouth rinse  15 mL Mouth Rinse Q2H  . metoCLOPramide (REGLAN) injection  20 mg Intravenous Q8H  . piperacillin-tazobactam (ZOSYN)  IV  3.375 g Intravenous Q8H  . polyethylene glycol  17 g Oral BID  . sennosides  10 mL Oral BID  . sodium chloride flush  3 mL Intravenous Q12H  . sodium chloride flush  3 mL Intravenous Q12H  . tamsulosin  0.4 mg Oral Daily    Assessment: Pharmacy consulted to dose heparin in this 75 year old male with ACS/STEMI.    CrCl = 14.6 ml/min Pt  was on heparin 5000 units SQ Q8H previously, last dose on 9/8 @ 1345.    Goal of Therapy:  Heparin level 0.3-0.7 units/ml Monitor platelets by anticoagulation protocol: Yes   Plan:  Current orders for heparin 1000 units/hr.  Heparin level 0.22, subtherapeutic. Will bolus 900 units, increase rate to 1100 units/hr. Recheck HL in 8 hours.   9/9 22:00 heparin level 0.41. Continue current regimen. Recheck with AM labs to confirm.  9/10 AM heparin level 0.32. Continue current regimen. Recheck heparin level and CBC tomorrow AM.  Lovell Roe S 11/04/2015,12:16 AM

## 2015-11-04 NOTE — Progress Notes (Signed)
Ms. Patria Mane NP bedside rounding, discussed pt.'s increased abd. Distention after restarting TF @ 1745. Ordered to hold TF O/N, NP wants to change IVF- but ordered to check w/ nephrology first. Will continue to monitor pt. closely

## 2015-11-04 NOTE — Progress Notes (Signed)
SUBJECTIVE: Patient remains intubated but responds to questioning which was not happening yesterday.   Vitals:   11/04/15 1100 11/04/15 1115 11/04/15 1130 11/04/15 1145  BP: 93/62 92/60 (!) 84/63 (!) 84/68  Pulse: 63 66 (!) 59 63  Resp: 15 14 20 18   Temp:      TempSrc:      SpO2: 100% 100% 100% 100%  Weight:      Height:        Intake/Output Summary (Last 24 hours) at 11/04/15 1157 Last data filed at 11/04/15 1100  Gross per 24 hour  Intake          2477.36 ml  Output              751 ml  Net          1726.36 ml    LABS: Basic Metabolic Panel:  Recent Labs  11/03/15 1801  11/04/15 0520 11/04/15 1102  NA 138  < > 140 138  K 3.7  < > 4.0 4.6  CL 107  < > 107 106  CO2 24  < > 25 24  GLUCOSE 130*  < > 99 126*  BUN 67*  < > 40* 32*  CREATININE 3.36*  < > 1.91* 1.59*  CALCIUM 8.1*  < > 8.3* 8.3*  MG 2.0  --  1.7  --   PHOS 3.1  3.2  < > 2.1*  2.3* 3.4  < > = values in this interval not displayed. Liver Function Tests:  Recent Labs  11/02/15 0543 11/03/15 0147  11/04/15 0520 11/04/15 1102  AST 38 48*  --   --   --   ALT 18 20  --   --   --   ALKPHOS 81 72  --   --   --   BILITOT 0.7 1.0  --   --   --   PROT 5.3* 5.2*  --   --   --   ALBUMIN 3.0* 2.8*  < > 2.8* 2.9*  < > = values in this interval not displayed. No results for input(s): LIPASE, AMYLASE in the last 72 hours. CBC:  Recent Labs  11/03/15 0147 11/04/15 0520  WBC 4.4 6.7  HGB 7.9* 8.3*  HCT 23.2* 24.5*  MCV 92.6 93.1  PLT 80* 105*   Cardiac Enzymes:  Recent Labs  11/12/2015 1717 11/09/2015 2218 11/02/15 0543  TROPONINI 0.14* 0.13* 0.20*   BNP: Invalid input(s): POCBNP D-Dimer: No results for input(s): DDIMER in the last 72 hours. Hemoglobin A1C: No results for input(s): HGBA1C in the last 72 hours. Fasting Lipid Panel: No results for input(s): CHOL, HDL, LDLCALC, TRIG, CHOLHDL, LDLDIRECT in the last 72 hours. Thyroid Function Tests: No results for input(s): TSH, T4TOTAL, T3FREE,  THYROIDAB in the last 72 hours.  Invalid input(s): FREET3 Anemia Panel: No results for input(s): VITAMINB12, FOLATE, FERRITIN, TIBC, IRON, RETICCTPCT in the last 72 hours.   PHYSICAL EXAM General: Well developed, well nourished, in no acute distress HEENT:  Normocephalic and atramatic Neck:  No JVD.  Lungs: Clear bilaterally to auscultation and percussion. Heart: HRRR . Normal S1 and S2 without gallops or murmurs.  Abdomen: Bowel sounds are positive, abdomen soft and non-tender  Msk:  Back normal, normal gait. Normal strength and tone for age. Extremities: No clubbing, cyanosis or edema.   Neuro: Alert and oriented X 3. 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 Patient is responding better compared to yesterday after dialysis but still remains hypotensive requiring pressor support. His course with the family yesterday for an hour that the prognosis is poor.  Principal Problem:   UTI (lower urinary tract infection) Active Problems:   Cardiac arrest ([place])   Acute respiratory failure ([place])   Acute renal failure (HCC)    Kendrah Lovern A, MD, [facility] 11/04/2015 11:57 AM

## 2015-11-05 ENCOUNTER — Inpatient Hospital Stay: Payer: Medicare HMO

## 2015-11-05 DIAGNOSIS — I469 Cardiac arrest, cause unspecified: Secondary | ICD-10-CM

## 2015-11-05 DIAGNOSIS — A419 Sepsis, unspecified organism: Secondary | ICD-10-CM

## 2015-11-05 DIAGNOSIS — N170 Acute kidney failure with tubular necrosis: Secondary | ICD-10-CM

## 2015-11-05 LAB — CBC
HEMATOCRIT: 22.9 % — AB (ref 40.0–52.0)
Hemoglobin: 7.7 g/dL — ABNORMAL LOW (ref 13.0–18.0)
MCH: 31.5 pg (ref 26.0–34.0)
MCHC: 33.7 g/dL (ref 32.0–36.0)
MCV: 93.7 fL (ref 80.0–100.0)
Platelets: 83 10*3/uL — ABNORMAL LOW (ref 150–440)
RBC: 2.44 MIL/uL — AB (ref 4.40–5.90)
RDW: 15.2 % — AB (ref 11.5–14.5)
WBC: 6.6 10*3/uL (ref 3.8–10.6)

## 2015-11-05 LAB — RENAL FUNCTION PANEL
ALBUMIN: 2.6 g/dL — AB (ref 3.5–5.0)
ALBUMIN: 2.5 g/dL — AB (ref 3.5–5.0)
ALBUMIN: 2.7 g/dL — AB (ref 3.5–5.0)
ANION GAP: 7 (ref 5–15)
ANION GAP: 6 (ref 5–15)
Albumin: 2.5 g/dL — ABNORMAL LOW (ref 3.5–5.0)
Albumin: 2.7 g/dL — ABNORMAL LOW (ref 3.5–5.0)
Anion gap: 7 (ref 5–15)
Anion gap: 6 (ref 5–15)
Anion gap: 7 (ref 5–15)
BUN: 22 mg/dL — AB (ref 6–20)
BUN: 20 mg/dL (ref 6–20)
BUN: 17 mg/dL (ref 6–20)
BUN: 15 mg/dL (ref 6–20)
BUN: 13 mg/dL (ref 6–20)
CALCIUM: 8.1 mg/dL — AB (ref 8.9–10.3)
CALCIUM: 8.3 mg/dL — AB (ref 8.9–10.3)
CALCIUM: 8.4 mg/dL — AB (ref 8.9–10.3)
CHLORIDE: 105 mmol/L (ref 101–111)
CHLORIDE: 104 mmol/L (ref 101–111)
CO2: 25 mmol/L (ref 22–32)
CO2: 25 mmol/L (ref 22–32)
CO2: 27 mmol/L (ref 22–32)
CO2: 25 mmol/L (ref 22–32)
CO2: 24 mmol/L (ref 22–32)
CREATININE: 0.96 mg/dL (ref 0.61–1.24)
Calcium: 8.2 mg/dL — ABNORMAL LOW (ref 8.9–10.3)
Calcium: 8.1 mg/dL — ABNORMAL LOW (ref 8.9–10.3)
Chloride: 103 mmol/L (ref 101–111)
Chloride: 103 mmol/L (ref 101–111)
Chloride: 106 mmol/L (ref 101–111)
Creatinine, Ser: 1.09 mg/dL (ref 0.61–1.24)
Creatinine, Ser: 1.04 mg/dL (ref 0.61–1.24)
Creatinine, Ser: 1.02 mg/dL (ref 0.61–1.24)
Creatinine, Ser: 0.96 mg/dL (ref 0.61–1.24)
GFR calc Af Amer: >60 mL/min (ref >60)
GFR calc Af Amer: >60 mL/min (ref >60)
GFR calc Af Amer: >60 mL/min (ref >60)
GFR calc non Af Amer: >60 mL/min (ref >60)
GFR calc non Af Amer: >60 mL/min (ref >60)
GFR calc non Af Amer: >60 mL/min (ref >60)
GLUCOSE: 106 mg/dL — AB (ref 65–99)
GLUCOSE: 113 mg/dL — AB (ref 65–99)
Glucose, Bld: 114 mg/dL — ABNORMAL HIGH (ref 65–99)
Glucose, Bld: 129 mg/dL — ABNORMAL HIGH (ref 65–99)
Glucose, Bld: 122 mg/dL — ABNORMAL HIGH (ref 65–99)
PHOSPHORUS: 2.4 mg/dL — AB (ref 2.5–4.6)
PHOSPHORUS: 1.9 mg/dL — AB (ref 2.5–4.6)
POTASSIUM: 3.8 mmol/L (ref 3.5–5.1)
POTASSIUM: 3.3 mmol/L — AB (ref 3.5–5.1)
POTASSIUM: 3.7 mmol/L (ref 3.5–5.1)
POTASSIUM: 4.2 mmol/L (ref 3.5–5.1)
Phosphorus: 1.9 mg/dL — ABNORMAL LOW (ref 2.5–4.6)
Phosphorus: 2.5 mg/dL (ref 2.5–4.6)
Phosphorus: 2.2 mg/dL — ABNORMAL LOW (ref 2.5–4.6)
Potassium: 4.3 mmol/L (ref 3.5–5.1)
SODIUM: 134 mmol/L — AB (ref 135–145)
Sodium: 137 mmol/L (ref 135–145)
Sodium: 136 mmol/L (ref 135–145)
Sodium: 136 mmol/L (ref 135–145)
Sodium: 137 mmol/L (ref 135–145)

## 2015-11-05 LAB — GLUCOSE, CAPILLARY
GLUCOSE-CAPILLARY: 140 mg/dL — AB (ref 65–99)
GLUCOSE-CAPILLARY: 130 mg/dL — AB (ref 65–99)
Glucose-Capillary: 108 mg/dL — ABNORMAL HIGH (ref 65–99)
Glucose-Capillary: 117 mg/dL — ABNORMAL HIGH (ref 65–99)
Glucose-Capillary: 119 mg/dL — ABNORMAL HIGH (ref 65–99)
Glucose-Capillary: 154 mg/dL — ABNORMAL HIGH (ref 65–99)
Glucose-Capillary: 90 mg/dL (ref 65–99)

## 2015-11-05 LAB — BLOOD GAS, ARTERIAL
ACID-BASE EXCESS: 2.7 mmol/L — AB (ref 0.0–2.0)
BICARBONATE: 26.2 mmol/L (ref 20.0–28.0)
O2 Saturation: 98.2 %
PCO2 ART: 36 mmHg (ref 32.0–48.0)
PH ART: 7.47 — AB (ref 7.350–7.450)
PO2 ART: 101 mmHg (ref 83.0–108.0)
Patient temperature: 37

## 2015-11-05 LAB — MAGNESIUM: Magnesium: 1.7 mg/dL (ref 1.7–2.4)

## 2015-11-05 LAB — HEPARIN LEVEL (UNFRACTIONATED): Heparin Unfractionated: 0.19 IU/mL — ABNORMAL LOW (ref 0.30–0.70)

## 2015-11-05 MED ORDER — POTASSIUM PHOSPHATES 15 MMOLE/5ML IV SOLN
30.0000 mmol | Freq: Once | INTRAVENOUS | Status: DC
  Filled 2015-11-05: qty 10

## 2015-11-05 MED ORDER — DEXTROSE 5 % IV SOLN: 20.0000 mmol | Freq: Once | INTRAVENOUS | Status: DC

## 2015-11-05 MED ORDER — SODIUM CHLORIDE 0.9 % IV SOLN
250.0000 mg | Freq: Three times a day (TID) | INTRAVENOUS | Status: DC
  Administered 2015-11-05 – 2015-11-07 (×6): 250 mg via INTRAVENOUS
  Filled 2015-11-05 (×10): qty 5

## 2015-11-05 MED ORDER — METOCLOPRAMIDE HCL 5 MG/ML IJ SOLN
5.0000 mg | Freq: Four times a day (QID) | INTRAMUSCULAR | Status: DC
  Administered 2015-11-05: 5 mg via INTRAVENOUS
  Filled 2015-11-05: qty 2

## 2015-11-05 MED ORDER — DEXTROSE 10 % IV SOLN
INTRAVENOUS | Status: DC
  Administered 2015-11-05 – 2015-11-06 (×2): via INTRAVENOUS

## 2015-11-05 MED ORDER — HEPARIN BOLUS VIA INFUSION
2200.0000 [IU] | Freq: Once | INTRAVENOUS | Status: AC
  Administered 2015-11-05: 2200 [IU] via INTRAVENOUS
  Filled 2015-11-05: qty 2200

## 2015-11-05 MED ORDER — POTASSIUM PHOSPHATES 15 MMOLE/5ML IV SOLN: 30.0000 mmol | Freq: Once | INTRAVENOUS | Status: DC

## 2015-11-05 MED ORDER — POTASSIUM PHOSPHATES 15 MMOLE/5ML IV SOLN
15.0000 mmol | Freq: Once | INTRAVENOUS | Status: AC
  Administered 2015-11-05: 15 mmol via INTRAVENOUS
  Filled 2015-11-05: qty 5

## 2015-11-05 MED ORDER — HEPARIN SODIUM (PORCINE) 5000 UNIT/ML IJ SOLN
5000.0000 [IU] | Freq: Three times a day (TID) | INTRAMUSCULAR | Status: DC
  Administered 2015-11-05 – 2015-11-08 (×9): 5000 [IU] via SUBCUTANEOUS
  Filled 2015-11-05 (×9): qty 1

## 2015-11-05 MED ORDER — LACTULOSE 10 GM/15ML PO SOLN
30.0000 g | Freq: Once | ORAL | Status: AC
  Administered 2015-11-05: 30 g
  Filled 2015-11-05: qty 60

## 2015-11-05 MED ORDER — MAGNESIUM SULFATE 2 GM/50ML IV SOLN
2.0000 g | Freq: Once | INTRAVENOUS | Status: AC
  Administered 2015-11-05: 2 g via INTRAVENOUS
  Filled 2015-11-05: qty 50

## 2015-11-05 MED ORDER — POTASSIUM CHLORIDE 10 MEQ/100ML IV SOLN: 10.0000 meq | INTRAVENOUS | Status: DC

## 2015-11-05 NOTE — Progress Notes (Addendum)
ANTICOAGULATION CONSULT NOTE - Initial Consult  Pharmacy Consult for Heparin  Indication: chest pain/ACS  Allergies  Allergen Reactions  . No Known Allergies Other (See Comments)    Patient Measurements: Height: 5\' 8"  (172.7 cm) Weight: 165 lb 9.1 oz (75.1 kg) IBW/kg (Calculated) : 68.4 Heparin Dosing Weight: 75 kg   Vital Signs: Temp: 97.5 F (36.4 C) (09/11 0500) Temp Source: Core (Comment) (09/11 0400) BP: 102/62 (09/11 0545) Pulse Rate: 71 (09/11 0545)  Labs:  Recent Labs  11/03/15 0147  11/03/15 2151  11/04/15 0520  11/04/15 2109 11/05/15 0158 11/05/15 0530  HGB 7.9*  --   --   --  8.3*  --   --   --  7.7*  HCT 23.2*  --   --   --  24.5*  --   --   --  22.9*  PLT 80*  --   --   --  105*  --   --   --  83*  HEPARINUNFRC 0.18*  < > 0.41  --  0.32  --   --   --  0.19*  CREATININE 4.46*  < > 2.64*  < > 1.91*  < > 1.21 1.09 1.04  < > = values in this interval not displayed.  Estimated Creatinine Clearance: 59.4 mL/min (by C-G formula based on SCr of 1.04 mg/dL).   Medical History: Past Medical History:  Diagnosis Date  . Anginal pain ([place])    had angina 2-3 mths prior to visit  . Arthritis   . CHF (congestive heart failure) ([place])   . Chronic kidney disease    follwed by Nephrologist Dr Juanito Doom in [place], Stage 3  . Coronary artery disease   . DDD (degenerative disc disease), cervical   . Diabetes mellitus without complication (HCC)    DIET CONTROLLED   . Dysphagia   . Frequent urination   . GERD (gastroesophageal reflux disease)   . Gout   . Hyperlipidemia   . Hypertension   . MI (myocardial infarction) ([place])   . Orthopnea   . Pancytopenia ([place])   . Shortness of breath dyspnea    at rest, mainly when he's active  . Stroke [facility])    ?? small stroke om 2017  came and went    Medications:  Scheduled:  . aspirin  81 mg Oral Daily  . atorvastatin  10 mg Oral q1800  . chlorhexidine  15 mL Mouth Rinse BID  . famotidine (PEPCID) IV  20 mg  Intravenous Q24H  . feeding supplement (PRO-STAT SUGAR FREE 64)  30 mL Per Tube BID  . feeding supplement (VITAL HIGH PROTEIN)  1,000 mL Per Tube Q24H  . fentaNYL  50 mcg Intravenous Once  . heparin  2,200 Units Intravenous Once  . isosorbide mononitrate  240 mg Oral Daily  . mouth rinse  15 mL Mouth Rinse Q2H  . metoCLOPramide (REGLAN) injection  20 mg Intravenous Q8H  . piperacillin-tazobactam (ZOSYN)  IV  3.375 g Intravenous Q8H  . polyethylene glycol  17 g Oral BID  . sennosides  10 mL Oral BID  . sodium chloride flush  10-40 mL Intracatheter Q12H  . sodium chloride flush  3 mL Intravenous Q12H  . sodium chloride flush  3 mL Intravenous Q12H  . tamsulosin  0.4 mg Oral Daily    Assessment: Pharmacy consulted to dose heparin in this 75 year old male with ACS/STEMI.    CrCl = 14.6 ml/min Pt was on heparin 5000 units SQ  Q8H previously, last dose on 9/8 @ 1345.    Goal of Therapy:  Heparin level 0.3-0.7 units/ml Monitor platelets by anticoagulation protocol: Yes   Plan:  Current orders for heparin 1000 units/hr.  Heparin level 0.22, subtherapeutic. Will bolus 900 units, increase rate to 1100 units/hr. Recheck HL in 8 hours.   9/9 22:00 heparin level 0.41. Continue current regimen. Recheck with AM labs to confirm.  9/10 AM heparin level 0.32. Continue current regimen. Recheck heparin level and CBC tomorrow AM.  9/11 AM heparin level 0.19. 2200 unit bolus and increase to 1350 units/hr. Recheck in 8 hours.  Tasha Diaz S 11/05/2015,6:22 AM

## 2015-11-05 NOTE — Progress Notes (Signed)
* [facility]     ASSESSMENT / PLAN: 75 yo AAM with systolic CHF [id number], multiple medical issues admitted for sepsis from UTI with acidosis>> ICU due to to acute cardiac arrest now with ARF on CRRT.   PULMONARY A: Acute Respiratory Failure 2/2 cardiac arrest --Vent: PRVC/12/450/5/24% P: -Pt is stable on vent, however he continues on CRRT and pressor support. -Continue Bronchodilator Therapy -ABG and CXR DAILY PRN .   CARDIOVASCULAR A: Cardiac aarest Cardiogenic shock Bradycardiac with HR in the 50s-HR improved to NSR in the 60s CHF P: -Continue low dose levophed to maintain MAP>65 or SBP>90 -Hemodynamic per ICU -PRN atropine if HR LE 30  RENAL A: Pt had CKD before admission, not with ARF.  Hypokalemia P: -Nephrology following; continue CRRT per nephro. -follow chem 7 -follow Is/Os -continue Foley Catheter -CRRT fluids adjusted for low K+  GASTROINTESTINAL A: Increasing abd distension seen over past few days, review of imaging shows dilated bowel loops. This is likely c/w ileus from recent cardiac arrest.  P: Stop tube feeds, continue NGT to suction for bowel rest.  Famotidine IV bid Change IV fluids to D5/NS Reglan 5mg  iv q6h x 2 doses for intestinal motility Lactulose 30 grams per tube x 1  HEMATOLOGIC Anemia of chronic disease P: Follow CBC Transfuse if Hg<7  INFECTIOUS A: Urosepsis --CXR suggestive of right lung pneumonia vs. Atelectasis.  P: -Empric abx with zosyn -F/U cultures  ENDOCRINE A: Hypoglycemia P: -Monitor blood glucose  NEUROLOGIC AMS High risk for anoxic brain injury secondary to cardiac arrest P: -Keep  intubated and sedated -Minimal sedation to achieve a RASS goal: -1 -Awaiting neurology input  Disposition and family update: ICU status. Wife at bedside. Updated on current treatment plan.   Advanced care planning note 11/03/15: Patient's condition was discussed with the family including  the patient's wife and his sisters, patient's nurse, nephrology was also in attendance. We explained that the patient's prognosis is poor, given his multiorgan failure, which includes cardiac arrest, chronic systolic congestive heart failure with EF 35%, acute on chronic kidney disease, acute hypoxic respiratory failure, ventilator dependent, as well as hypotension. We explained that should the patient's heart stopped once again to be unlikely that we could get him back and he would have a good chance of meaningful recovery. Explained the risks and benefits of placing the patient on a dialysis machine, as well as placement of a dialysis catheter. This will discuss the likelihood that the patient will be on hemodialysis permanently. They appeared to understand the risks and benefits, they are not ready to transition to comfort measures only and would like to continue all measures including keeping the patient full code. We'll therefore proceed with the placement of hemodialysis catheter, initiate dialysis. Continue to monitor the patient as per patient's family's wishes. 30 min spent in advance care planning.  -Deep Ramachandran 11/04/2015   Date: 11/05/2015  MRN# [id number] Robert Parks Oct 27, 1940   Robert Parks is a 75 y.o. old male seen in follow up for chief complaint of  Chief Complaint  Patient presents with  . Extremity Weakness   SUBJECTIVE: Maintained on CRRT overnight with significant improvement in lab indices. Still requiring low dose pressors. No acute issues overnight. Electrolytes corrected. Remains critically ill  Allergies:  No known allergies  Review of Systems: No able to provide due to critical illness.   Physical Examination:   VS: BP (!) 94/59   Pulse 64   Temp 97.5 F (36.4 C) (Other (  Comment)) Comment (Src): Esophageal probe  Resp 16   Ht 5\' 8"  (1.727 m)   Wt 165 lb 9.1 oz (75.1 kg)   SpO2 100%   BMI 25.17 kg/m   General Appearance: Awake, nods in approval or  denial  Neuro:Following basic commands HEENT: PERRLA, EOM intact. Pulmonary: normal breath sounds, No wheezing.   Cardiovascular: Normal S1,S2.  No m/r/g.   Abdomen: Benign, Soft, non-tender. Renal:  No costovertebral tenderness  GU:  Not performed at this time. Endoc: No evident thyromegaly, no signs of acromegaly. Skin:   warm, no rash. Extremities: normal, no cyanosis, clubbing.   LABORATORY PANEL:   CBC  Recent Labs Lab 11/05/15 0530  WBC 6.6  HGB 7.7*  HCT 22.9*  PLT 83*   ------------------------------------------------------------------------------------------------------------------  Chemistries   Recent Labs Lab 11/03/15 0147  11/05/15 0530  NA 138  < > 136  K 4.1  < > 3.3*  CL 107  < > 104  CO2 19*  < > 25  GLUCOSE 81  < > 114*  BUN 91*  < > 20  CREATININE 4.46*  < > 1.04  CALCIUM 8.2*  < > 8.1*  MG 2.0  < > 1.7  AST 48*  --   --   ALT 20  --   --   ALKPHOS 72  --   --   BILITOT 1.0  --   --   < > = values in this interval not displayed. ------------------------------------------------------------------------------------------------------------------  Cardiac Enzymes  Recent Labs Lab 11/02/15 0543  TROPONINI 0.20*   Dg Chest Port 1 View  Result Date: 11/04/2015 CLINICAL DATA:  Respiratory failure EXAM: PORTABLE CHEST 1 VIEW COMPARISON:  11/03/2015 FINDINGS: Cardiomegaly is noted. Central mild vascular congestion without convincing pulmonary edema. Stable endotracheal and NG tube position. There is small right pleural effusion on with right basilar atelectasis or infiltrate. Trace left pleural effusion with left basilar atelectasis. Status post median sternotomy. IMPRESSION: Stable support apparatus. There is small right pleural effusion on with right basilar atelectasis or infiltrate. Trace left pleural effusion with left basilar atelectasis. Electronically Signed   By: Lahoma Crocker M.D.   On: 11/04/2015 10:11    Total CCM time=35  minutes  Magdalene S. [facility] ANP-BC Pulmonary and [place] Pager [phone] or [phone]  11/05/2015    STAFF NOTE: I, Dr. [name],  have personally reviewed patient's available data, including medical history, events of note, physical examination and test results as part of my evaluation. I have discussed with NP and other care providers such as pharmacist, RN and RRT.  In addition,  I personally evaluated patient and elicited key findings     A:remains intubated, cardiogenic shock  P:vent and vasopressor support       The Rest per NP whose note is outlined above and that I agree with  I have personally reviewed/obtained a history, examined the patient, evaluated Pertinent laboratory and RadioGraph[employer]/imaging results, and  formulated the assessment and plan   The Patient requires high complexity decision making for assessment and support, frequent evaluation and titration of therapies, application of advanced monitoring technologies and extensive interpretation of multiple databases. Critical Care Time devoted to patient care services described in this note is 45 minutes.  This Critical care time does not reflrect procedure time or supervisory time of NP but could involve care discussion time Overall, patient is critically ill, prognosis is guarded.  Patient with Multiorgan failure and at high risk for cardiac arrest  and death.    Recommend DNR status and comfort care measures. Will update family today  Desma Wilkowski Patricia Pesa, M.D.  [facility]  Medical Director [place] Director [facility] Cardio-Pulmonary Department

## 2015-11-05 NOTE — Progress Notes (Signed)
SUBJECTIVE: Patient is more alert today   Vitals:   11/05/15 0700 11/05/15 0730 11/05/15 0757 11/05/15 0800  BP: (!) 92/59 (!) 94/59  (!) 89/57  Pulse: 68 64  72  Resp: 18 16  16   Temp:  97.5 F (36.4 C)    TempSrc:  Other (Comment)    SpO2: 100% 100% 100% 100%  Weight:      Height:        Intake/Output Summary (Last 24 hours) at 11/05/15 0845 Last data filed at 11/05/15 0800  Gross per 24 hour  Intake          2408.67 ml  Output              194 ml  Net          2214.67 ml    LABS: Basic Metabolic Panel:  Recent Labs  11/04/15 0520  11/05/15 0158 11/05/15 0530  NA 140  < > 137 136  K 4.0  < > 3.8 3.3*  CL 107  < > 105 104  CO2 25  < > 25 25  GLUCOSE 99  < > 106* 114*  BUN 40*  < > 22* 20  CREATININE 1.91*  < > 1.09 1.04  CALCIUM 8.3*  < > 8.2* 8.1*  MG 1.7  --   --  1.7  PHOS 2.1*  2.3*  < > 2.4* 1.9*  < > = values in this interval not displayed. Liver Function Tests:  Recent Labs  11/03/15 0147  11/05/15 0158 11/05/15 0530  AST 48*  --   --   --   ALT 20  --   --   --   ALKPHOS 72  --   --   --   BILITOT 1.0  --   --   --   PROT 5.2*  --   --   --   ALBUMIN 2.8*  < > 2.6* 2.5*  < > = values in this interval not displayed. No results for input(s): LIPASE, AMYLASE in the last 72 hours. CBC:  Recent Labs  11/04/15 0520 11/05/15 0530  WBC 6.7 6.6  HGB 8.3* 7.7*  HCT 24.5* 22.9*  MCV 93.1 93.7  PLT 105* 83*   Cardiac Enzymes: No results for input(s): CKTOTAL, CKMB, CKMBINDEX, TROPONINI in the last 72 hours. BNP: Invalid input(s): POCBNP D-Dimer: No results for input(s): DDIMER in the last 72 hours. Hemoglobin A1C: No results for input(s): HGBA1C in the last 72 hours. Fasting Lipid Panel: No results for input(s): CHOL, HDL, LDLCALC, TRIG, CHOLHDL, LDLDIRECT in the last 72 hours. Thyroid Function Tests: No results for input(s): TSH, T4TOTAL, T3FREE, THYROIDAB in the last 72 hours.  Invalid input(s): FREET3 Anemia Panel: No results for  input(s): VITAMINB12, FOLATE, FERRITIN, TIBC, IRON, RETICCTPCT in the last 72 hours.   PHYSICAL EXAM General: Well developed, well nourished, in no acute distress HEENT:  Normocephalic and atramatic Neck:  No JVD.  Lungs: Clear bilaterally to auscultation and percussion. Heart: HRRR . Normal S1 and S2 without gallops or murmurs.  Abdomen: Bowel sounds are positive, abdomen soft and non-tender  Msk:  Back normal, normal gait. Normal strength and tone for age. Extremities: No clubbing, cyanosis or edema.   Neuro: Alert and oriented X 3. Psych:  Good affect, responds appropriately  TELEMETRY:Sinus rhythm 65-70 bpm  ASSESSMENT AND PLAN: Status post cardiopulmonary arrest renal failure and cardiomyopathy with left ventricular ejection fraction 35% and possible sepsis. After dialysis patient is improving significantly and  may be extubated today.  Principal Problem:   UTI (lower urinary tract infection) Active Problems:   Cardiac arrest ([place])   Acute respiratory failure ([place])   Acute renal failure (H[facility])    KHAN,SHAUKAT A, MD, [facility] 11/05/2015 8:45 AM

## 2015-11-05 NOTE — Progress Notes (Signed)
CRRT cartridge changed because it was getting ready to expire.  All new CRRT tubing changed also including Therapy fluid bags changed.

## 2015-11-05 NOTE — Progress Notes (Signed)
[place] consulted for constipation and electrolyte management in this 75 yo AAM admitted for sepsis from UTI and acute cardiac arrest with multiorgan failure. Tube feeds currently on hold due to concerns for gi motility. Pt currently on CRRT.  1. Constipation: Patient had small bowel movement  09/11 @ 0700. Patient currently receiving 10 mL sennosides 8.8mg /12mL syrup bid, polyethylene glycol 17 g bid and lactulose 30 g. Per rounds, patient's metoclopramide was discontinued and erythromycin 250 mg q 8 hours was initiated.   2. Electrolytes: Patient received magnesium 2 g x 1 and Kphos 15 mmol x 1. Renal function labs ordered q 4 hours. Magnesium and phos ordered with 09/12 am labs. Pharmacy will continue to monitor.   Allergies  Allergen Reactions  . No Known Allergies Other (See Comments)    Patient Measurements: Height: 5\' 8"  (172.7 cm) Weight: 165 lb 9.1 oz (75.1 kg) IBW/kg (Calculated) : 68.4 Usual Weight: 75.1  Vital Signs: Temp: 97.3 F (36.3 C) (09/11 1000) Temp Source: Other (Comment) (09/11 0730) BP: 85/56 (09/11 1115) Pulse Rate: 60 (09/11 1115)  Labs:  Recent Labs  11/03/15 0147 11/04/15 0520 11/05/15 0530  WBC 4.4 6.7 6.6  HGB 7.9* 8.3* 7.7*  HCT 23.2* 24.5* 22.9*  PLT 80* 105* 83*     Recent Labs  11/03/15 0147  11/03/15 1801  11/04/15 0520  11/04/15 2109 11/05/15 0158 11/05/15 0530  NA 138  < > 138  < > 140  < > 139 137 136  K 4.1  < > 3.7  < > 4.0  < > 4.7 3.8 3.3*  CL 107  < > 107  < > 107  < > 106 105 104  CO2 19*  < > 24  < > 25  < > 25 25 25   GLUCOSE 81  < > 130*  < > 99  < > 85 106* 114*  BUN 91*  < > 67*  < > 40*  < > 23* 22* 20  CREATININE 4.46*  < > 3.36*  < > 1.91*  < > 1.21 1.09 1.04  CALCIUM 8.2*  < > 8.1*  < > 8.3*  < > 8.3* 8.2* 8.1*  MG 2.0  --  2.0  --  1.7  --   --   --  1.7  PHOS 4.0  < > 3.1  3.2  < > 2.1*  2.3*  < > 3.4 2.4* 1.9*  PROT 5.2*  --   --   --   --   --   --   --   --   ALBUMIN 2.8*  < > 2.7*  < >  2.8*  < > 2.8* 2.6* 2.5*  AST 48*  --   --   --   --   --   --   --   --   ALT 20  --   --   --   --   --   --   --   --   ALKPHOS 72  --   --   --   --   --   --   --   --   BILITOT 1.0  --   --   --   --   --   --   --   --   < > = values in this interval not displayed. Estimated Creatinine Clearance: 59.4 mL/min (by C-G formula based on SCr of 1.04 mg/dL).    Recent  Labs  11/05/15 0405 11/05/15 0720 11/05/15 1116  GLUCAP 154* 108* 140*    Medical History: Past Medical History:  Diagnosis Date  . Anginal pain ([place])    had angina 2-3 mths prior to visit  . Arthritis   . CHF (congestive heart failure) ([place])   . Chronic kidney disease    follwed by Nephrologist Dr Juanito Doom in [place], Stage 3  . Coronary artery disease   . DDD (degenerative disc disease), cervical   . Diabetes mellitus without complication (H[facility])    DIET CONTROLLED   . Dysphagia   . Frequent urination   . GERD (gastroesophageal reflux disease)   . Gout   . Hyperlipidemia   . Hypertension   . MI (myocardial infarction) ([place])   . Orthopnea   . Pancytopenia ([place])   . Shortness of breath dyspnea    at rest, mainly when he's active  . Stroke [facility])    ?? small stroke om 2017  came and went    Medications:  Scheduled:  . aspirin  81 mg Oral Daily  . chlorhexidine  15 mL Mouth Rinse BID  . erythromycin  250 mg Intravenous Q8H  . famotidine (PEPCID) IV  20 mg Intravenous Q24H  . feeding supplement (PRO-STAT SUGAR FREE 64)  30 mL Per Tube BID  . feeding supplement (VITAL HIGH PROTEIN)  1,000 mL Per Tube Q24H  . fentaNYL  50 mcg Intravenous Once  . mouth rinse  15 mL Mouth Rinse Q2H  . piperacillin-tazobactam (ZOSYN)  IV  3.375 g Intravenous Q8H  . polyethylene glycol  17 g Oral BID  . potassium phosphate IVPB (mmol)  15 mmol Intravenous Once  . sennosides  10 mL Oral BID  . sodium chloride flush  10-40 mL Intracatheter Q12H  . sodium chloride flush  3 mL Intravenous Q12H  . sodium chloride flush   3 mL Intravenous Q12H   Infusions:  . dextrose 35 mL/hr at 11/05/15 1122  . fentaNYL infusion INTRAVENOUS Stopped (11/05/15 1035)  . heparin 1,350 Units/hr (11/05/15 0625)  . norepinephrine (LEVOPHED) Adult infusion 14 mcg/min (11/05/15 1045)  . pureflow 3 each (11/05/15 [id number])     Darrow Bussing, PharmD Pharmacy Resident 11/05/2015 11:27 AM

## 2015-11-05 NOTE — Progress Notes (Signed)
Patient ID: Robert Parks, male   DOB: May 29, 1940, 75 y.o.   MRN: [id number]    Thank you for consulting the Palliative Medicine Team at [facility] to meet your patient's and family's needs.   The reason that you asked [place] to see your patient is  For Establishing GOC  We have scheduled your patient for a meeting: Tomorrow 11-06-15 ay 0800  The Surrogate decision make [id number] [place] information:#[phone]  Wadie Lessen NP  Palliative Medicine Team Team Phone # [phone] Pager [phone]

## 2015-11-05 NOTE — Progress Notes (Signed)
Nutrition Follow-up  DOCUMENTATION CODES:   Not applicable  INTERVENTION:  -TF: nutritional poc discussed during ICU rounds, plan to continue to hold TF today per MD Kasa, continue decompression and bowel regimen and hopefully reinitiate tomorrow   NUTRITION DIAGNOSIS:   Inadequate oral intake related to acute illness, poor appetite as evidenced by NPO status, per patient/family report.  Continues  GOAL:   Patient will meet greater than or equal to 90% of their needs  MONITOR:   Vent status, Labs, Weight trends  REASON FOR ASSESSMENT:   Ventilator, Consult Poor PO  ASSESSMENT:    75 yo male with history of angina, arthritis, CHF with ejection fraction 35%, CKD, was brought to ER with generalized weakness, hypotension, lactic acid high with leukocytosis. Patient went unresponsive, Code Blue called with PEA and asystole for approx 10 means, cervical neck fxs, sacral decub ulcer, now intubated on full vent support  Pt remains on vent support, CRRT  TF remain on hold, abdomen remains distended, OG to suction, +small BM this AM and plan for bowel regimen today, erythromycin for motility                                                        Diet Order:  Diet NPO time specified  Skin:  Wound (see comment) (stage II buttock)  Last BM:  9/11   Labs: potassium 3.3 and phosphorus 1.9 (supplemented)  Meds: reglan  Height:   Ht Readings from Last 1 Encounters:  11/15/2015 5\' 8"  (1.727 m)    Weight:   Wt Readings from Last 1 Encounters:  11/05/15 165 lb 9.1 oz (75.1 kg)    Ideal Body Weight:  70 kg  BMI:  Body mass index is 25.17 kg/m.  Estimated Nutritional Needs:   Kcal:  1625 kcals  Protein:  105-140 g   Fluid:  >/= 1.6 L  EDUCATION NEEDS:   No education needs identified at this time  Meta, [place], [place] [phone] Pager  [phone] Weekend/On-Call Pager

## 2015-11-05 NOTE — Progress Notes (Signed)
Family Meeting  I met with Wife, Sister and Daughter of patient, I have updated them and answered all questions. Patient remains critically ill  And with multiorgan failure  Patient with very poor prognosis. The family have agreed and consented to to make patient DNR status Will continue vent support, CRRT as needed and vasopressors.  I will attempt SBT/SAT daily  trials and update family accordingly.      patient is critically ill, prognosis is guarded.  Patient with Multiorgan failure and at high risk for cardiac arrest and death.    [name], M.D.  [facility]  Medical Director [place] Director [facility] Cardio-Pulmonary Department

## 2015-11-05 NOTE — Progress Notes (Signed)
Patient uncomfortable, restless in bed kicking his leg off side of bed.  Patient following commands intermittently.  RN made Dr. Mortimer Fries aware of all mentioned above and that patient has been off sedation since 1025.  MD gave order to restart fentanyl drip for patient's comfort. Fentanyl restarted.  Continuing to monitor.

## 2015-11-05 NOTE — Progress Notes (Signed)
[facility]  ROUNDING NOTE   Subjective:   Family at bedside.  Norepinephrine 11mg Anuric  On CRRT 2.5Litre, BFR 350. No UF   PRVC FiO24%    Objective:  Vital signs in last 24 hours:  Temp:  [93.9 F (34.4 C)-97.7 F (36.5 C)] 97.5 F (36.4 C) (09/11 0730) Pulse Rate:  [58-72] 63 (09/11 0900) Resp:  [10-24] 16 (09/11 0900) BP: (73-102)/(49-79) 89/57 (09/11 0800) SpO2:  [91 %-100 %] 100 % (09/11 0900) FiO2 (%):  [24 %] 24 % (09/11 0757) Weight:  [75.1 kg (165 lb 9.1 oz)] 75.1 kg (165 lb 9.1 oz) (09/11 0456)  Weight change: 2.3 kg (5 lb 1.1 oz) Filed Weights   11/03/15 0500 11/04/15 0500 11/05/15 0456  Weight: 74.1 kg (163 lb 5.8 oz) 72.8 kg (160 lb 7.9 oz) 75.1 kg (165 lb 9.1 oz)    Intake/Output: I/O last 3 completed shifts: In: 3876.3 [I.V.:2867.6; NG/GT:596.7; IV Piggyback:412] Out: 6244[Urine:168; Emesis/NG output:475; Other:1]   Intake/Output this shift:  No intake/output data recorded.  Physical Exam: General: Critically ill   Head: +ETT, +OG   Eyes: Anicteric, eyes open  Neck: No JVD, No LAD  Lungs:  Intubated, PRVC FiO24%  Heart: S1S2, regular  Abdomen:  +distended  Extremities: No peripheral edema.  Neurologic: Able to follow commands  Skin: No lesions  Access: Right femoral temp HD catheter 9/9 Dr. RIsidore Moos   Basic Metabolic Panel:  Recent Labs Lab 11/02/15 1714 11/03/15 0147  11/03/15 1801  11/04/15 0520  11/04/15 1356 11/04/15 1643 11/04/15 2109 11/05/15 0158 11/05/15 0530  NA  --  138  < > 138  < > 140  < > 138 138 139 137 136  K  --  4.1  < > 3.7  < > 4.0  < > 4.9 5.1 4.7 3.8 3.3*  CL  --  107  < > 107  < > 107  < > 106 106 106 105 104  CO2  --  19*  < > 24  < > 25  < > 22 23 25 25 25   GLUCOSE  --  81  < > 130*  < > 99  < > 124* 106* 85 106* 114*  BUN  --  91*  < > 67*  < > 40*  < > 29* 26* 23* 22* 20  CREATININE  --  4.46*  < > 3.36*  < > 1.91*  < > 1.48* 1.32* 1.21 1.09 1.04  CALCIUM  --  8.2*  < > 8.1*  < >  8.3*  < > 8.3* 8.4* 8.3* 8.2* 8.1*  MG 2.0 2.0  --  2.0  --  1.7  --   --   --   --   --  1.7  PHOS 4.3 4.0  < > 3.1  3.2  < > 2.1*  2.3*  < > 3.9 3.5 3.4 2.4* 1.9*  < > = values in this interval not displayed.  Liver Function Tests:  Recent Labs Lab 11/02/15 0543 11/03/15 0147  11/04/15 1356 11/04/15 1643 11/04/15 2109 11/05/15 0158 11/05/15 0530  AST 38 48*  --   --   --   --   --   --   ALT 18 20  --   --   --   --   --   --   ALKPHOS 81 72  --   --   --   --   --   --  BILITOT 0.7 1.0  --   --   --   --   --   --   PROT 5.3* 5.2*  --   --   --   --   --   --   ALBUMIN 3.0* 2.8*  < > 2.7* 2.8* 2.8* 2.6* 2.5*  < > = values in this interval not displayed. No results for input(s): LIPASE, AMYLASE in the last 168 hours. No results for input(s): AMMONIA in the last 168 hours.  CBC:  Recent Labs Lab 10/31/2015 1011 11/02/15 0543 11/03/15 0147 11/04/15 0520 11/05/15 0530  WBC 3.7* 4.1 4.4 6.7 6.6  HGB 8.6* 8.0* 7.9* 8.3* 7.7*  HCT 25.5* 23.6* 23.2* 24.5* 22.9*  MCV 93.9 94.4 92.6 93.1 93.7  PLT 87* 74* 80* 105* 83*    Cardiac Enzymes:  Recent Labs Lab 11/09/2015 1011 11/21/2015 1717 11/22/2015 2218 11/02/15 0543  TROPONINI 0.16* 0.14* 0.13* 0.20*    BNP: Invalid input(s): POCBNP  CBG:  Recent Labs Lab 11/04/15 1601 11/04/15 2045 11/05/15 0009 11/05/15 0405 11/05/15 0720  GLUCAP 82 67 119* 154* 108*    Microbiology: Results for orders placed or performed during the hospital encounter of 11/08/2015  Urine culture     Status: Abnormal   Collection Time: 11/12/2015 10:11 AM  Result Value Ref Range Status   Specimen Description URINE, RANDOM  Final   Special Requests NONE  Final   Culture >=100,000 COLONIES/mL KLEBSIELLA PNEUMONIAE (A)  Final   Report Status 11/03/2015 FINAL  Final   Organism ID, Bacteria KLEBSIELLA PNEUMONIAE (A)  Final      Susceptibility   Klebsiella pneumoniae - MIC*    AMPICILLIN >=32 RESISTANT Resistant     CEFAZOLIN <=4 SENSITIVE  Sensitive     CEFTRIAXONE <=1 SENSITIVE Sensitive     CIPROFLOXACIN <=0.25 SENSITIVE Sensitive     GENTAMICIN <=1 SENSITIVE Sensitive     IMIPENEM <=0.25 SENSITIVE Sensitive     NITROFURANTOIN <=16 SENSITIVE Sensitive     TRIMETH/SULFA <=20 SENSITIVE Sensitive     AMPICILLIN/SULBACTAM 4 SENSITIVE Sensitive     PIP/TAZO <=4 SENSITIVE Sensitive     Extended ESBL NEGATIVE Sensitive     * >=100,000 COLONIES/mL KLEBSIELLA PNEUMONIAE  Culture, blood (routine x 2)     Status: None (Preliminary result)   Collection Time: 10/31/2015 11:13 AM  Result Value Ref Range Status   Specimen Description BLOOD RIGHT AC  Final   Special Requests   Final    BOTTLES DRAWN AEROBIC AND ANAEROBIC ANA 8ML AER 10ML   Culture NO GROWTH 4 DAYS  Final   Report Status PENDING  Incomplete  Culture, blood (routine x 2)     Status: None (Preliminary result)   Collection Time: 11/22/2015 11:13 AM  Result Value Ref Range Status   Specimen Description BLOOD LEFT ARM  Final   Special Requests BOTTLES DRAWN AEROBIC AND ANAEROBIC [place]  Final   Culture NO GROWTH 4 DAYS  Final   Report Status PENDING  Incomplete  Culture, blood (routine x 2)     Status: None (Preliminary result)   Collection Time: 11/02/15  2:17 AM  Result Value Ref Range Status   Specimen Description BLOOD RIGHT HAND  Final   Special Requests BOTTLES DRAWN AEROBIC AND ANAEROBIC 5CC  Final   Culture NO GROWTH 3 DAYS  Final   Report Status PENDING  Incomplete  Culture, blood (routine x 2)     Status: None (Preliminary result)   Collection Time: 11/02/15  2:28 AM  Result Value Ref Range Status   Specimen Description BLOOD LEFT HAND  Final   Special Requests BOTTLES DRAWN AEROBIC AND ANAEROBIC [place]  Final   Culture NO GROWTH 3 DAYS  Final   Report Status PENDING  Incomplete  MRSA PCR Screening     Status: None   Collection Time: 11/02/15 10:42 AM  Result Value Ref Range Status   MRSA by PCR NEGATIVE NEGATIVE Final    Comment:        The GeneXpert MRSA  Assay (FDA approved for NASAL specimens only), is one component of a comprehensive MRSA colonization surveillance program. It is not intended to diagnose MRSA infection nor to guide or monitor treatment for MRSA infections.     Coagulation Studies: No results for input(s): LABPROT, INR in the last 72 hours.  Urinalysis: No results for input(s): COLORURINE, LABSPEC, PHURINE, GLUCOSEU, HGBUR, BILIRUBINUR, KETONESUR, PROTEINUR, UROBILINOGEN, NITRITE, LEUKOCYTESUR in the last 72 hours.  Invalid input(s): APPERANCEUR    Imaging: Dg Chest 1 View  Result Date: 11/05/2015 CLINICAL DATA:  Abdominal distention and renal failure. EXAM: CHEST 1 VIEW COMPARISON:  11/04/2015 FINDINGS: The support apparatus is stable. The endotracheal tube is 3.3 cm above the carina. Persistent cardiac enlargement, pulmonary edema, pleural effusions and bibasilar atelectasis. IMPRESSION: Stable support apparatus. Persistent cardiac enlargement, pulmonary edema, pleural effusions and atelectasis. Electronically Signed   By: Marijo Sanes M.D.   On: 11/05/2015 08:11   Dg Abd 1 View  Result Date: 11/05/2015 CLINICAL DATA:  Generalized abdominal pain and distension EXAM: ABDOMEN - 1 VIEW COMPARISON:  11/04/2015 FINDINGS: There is a nasogastric tube with tip in the body of the stomach. Right groin central venous catheter tip is in the projection of the IVC. Mild gaseous distension of the large bowel loops is similar to previous exam. IMPRESSION: 1. Stable gaseous distension of the large bowel loops. Electronically Signed   By: Kerby Moors M.D.   On: 11/05/2015 07:53   Dg Chest Port 1 View  Result Date: 11/04/2015 CLINICAL DATA:  Respiratory failure EXAM: PORTABLE CHEST 1 VIEW COMPARISON:  11/03/2015 FINDINGS: Cardiomegaly is noted. Central mild vascular congestion without convincing pulmonary edema. Stable endotracheal and NG tube position. There is small right pleural effusion on with right basilar atelectasis or  infiltrate. Trace left pleural effusion with left basilar atelectasis. Status post median sternotomy. IMPRESSION: Stable support apparatus. There is small right pleural effusion on with right basilar atelectasis or infiltrate. Trace left pleural effusion with left basilar atelectasis. Electronically Signed   By: Lahoma Crocker M.D.   On: 11/04/2015 10:11   Dg Abd Portable 1v  Result Date: 11/04/2015 CLINICAL DATA:  Generalized abdominal pain and distention. EXAM: PORTABLE ABDOMEN - 1 VIEW COMPARISON:  Radiographs of November 03, 2015. FINDINGS: No small bowel dilatation is noted. Air-filled colon is noted. There appears to be a dilated loop of sigmoid colon which may represent ileus. Distal tip of nasogastric tube is seen in stomach. Postsurgical changes are seen in epigastric region. Stool is noted in the rectum. IMPRESSION: Dilated loop of sigmoid colon is noted which may simply represent ileus, but continued radiographic follow-up is recommended to rule out obstruction. No small bowel dilatation is noted. Electronically Signed   By: Marijo Conception, M.D.   On: 11/04/2015 08:23     Medications:   . dextrose 35 mL/hr at 11/05/15 0600  . fentaNYL infusion INTRAVENOUS 150 mcg/hr (11/05/15 0600)  . heparin 1,350 Units/hr (11/05/15 0625)  . norepinephrine (LEVOPHED)  Adult infusion 13 mcg/min (11/05/15 0700)  . pureflow 3 each (11/05/15 8250)   . aspirin  81 mg Oral Daily  . atorvastatin  10 mg Oral q1800  . chlorhexidine  15 mL Mouth Rinse BID  . famotidine (PEPCID) IV  20 mg Intravenous Q24H  . feeding supplement (PRO-STAT SUGAR FREE 64)  30 mL Per Tube BID  . feeding supplement (VITAL HIGH PROTEIN)  1,000 mL Per Tube Q24H  . fentaNYL  50 mcg Intravenous Once  . isosorbide mononitrate  240 mg Oral Daily  . lactulose  30 g Per Tube Once  . magnesium sulfate 1 - 4 g bolus IVPB  2 g Intravenous Once  . mouth rinse  15 mL Mouth Rinse Q2H  . metoCLOPramide (REGLAN) injection  20 mg Intravenous Q8H   . metoCLOPramide (REGLAN) injection  5 mg Intravenous Q6H  . piperacillin-tazobactam (ZOSYN)  IV  3.375 g Intravenous Q8H  . polyethylene glycol  17 g Oral BID  . sennosides  10 mL Oral BID  . sodium chloride flush  10-40 mL Intracatheter Q12H  . sodium chloride flush  3 mL Intravenous Q12H  . sodium chloride flush  3 mL Intravenous Q12H  . tamsulosin  0.4 mg Oral Daily   sodium chloride, sodium chloride, acetaminophen, fentaNYL, heparin, midazolam, ondansetron (ZOFRAN) IV, sodium chloride flush, sodium chloride flush  Assessment/ Plan:  Robert Parks is a 75 y.o. black male with coronary artery disease, systolic congestive heart failure, diabetes mellitus type II, gout, hypertension, hyperlipidemia, CVA. Admitted on 11/20/2015 for  Urinary tract infection, sepsis, acute respiratory failure and acute renal failure.   1. Acute renal failure on chronic kidney disease stage IV baseline creatinine 2.3 EGFR 29 followed by [facility] nephrology. Anuric requiring CRRT. Acute renal failure from sepsis, urinary tract infection, hypotension and shock. Most likely ATN with poor chance of recovery.  - Continue CRRT while hemodynamically unstable. 4K bath.  - Replace electrolytes  2. Acute respiratory failure: on mechanical ventilation - appreciate pulmonary input  3. Anemia of chronic kidney disease: hemoglobin 7.7 - no indication for epo  4. Sepsis with urinary tract infection: E. Coli in urine culture - pip/tazo  Overall prognosis poor.    LOS: [place], [place] 9/11/20179:14 AM

## 2015-11-06 DIAGNOSIS — A419 Sepsis, unspecified organism: Secondary | ICD-10-CM

## 2015-11-06 DIAGNOSIS — Z789 Other specified health status: Secondary | ICD-10-CM

## 2015-11-06 LAB — CBC
HCT: 23 % — ABNORMAL LOW (ref 40.0–52.0)
Hemoglobin: 7.8 g/dL — ABNORMAL LOW (ref 13.0–18.0)
MCH: 31.9 pg (ref 26.0–34.0)
MCHC: 33.8 g/dL (ref 32.0–36.0)
MCV: 94.4 fL (ref 80.0–100.0)
Platelets: 77 10*3/uL — ABNORMAL LOW (ref 150–440)
RBC: 2.44 MIL/uL — ABNORMAL LOW (ref 4.40–5.90)
RDW: 15.7 % — AB (ref 11.5–14.5)
WBC: 6.9 10*3/uL (ref 3.8–10.6)

## 2015-11-06 LAB — RENAL FUNCTION PANEL
ALBUMIN: 2.5 g/dL — AB (ref 3.5–5.0)
ALBUMIN: 2.6 g/dL — AB (ref 3.5–5.0)
ANION GAP: 4 — AB (ref 5–15)
ANION GAP: 5 (ref 5–15)
ANION GAP: 11 (ref 5–15)
Albumin: 2.6 g/dL — ABNORMAL LOW (ref 3.5–5.0)
Albumin: 2.7 g/dL — ABNORMAL LOW (ref 3.5–5.0)
Albumin: 2.6 g/dL — ABNORMAL LOW (ref 3.5–5.0)
Anion gap: 7 (ref 5–15)
Anion gap: 6 (ref 5–15)
BUN: 14 mg/dL (ref 6–20)
BUN: 12 mg/dL (ref 6–20)
BUN: 11 mg/dL (ref 6–20)
BUN: 11 mg/dL (ref 6–20)
BUN: 13 mg/dL (ref 6–20)
CALCIUM: 8.3 mg/dL — AB (ref 8.9–10.3)
CALCIUM: 8.3 mg/dL — AB (ref 8.9–10.3)
CALCIUM: 8.3 mg/dL — AB (ref 8.9–10.3)
CALCIUM: 8.4 mg/dL — AB (ref 8.9–10.3)
CHLORIDE: 106 mmol/L (ref 101–111)
CHLORIDE: 106 mmol/L (ref 101–111)
CHLORIDE: 106 mmol/L (ref 101–111)
CHLORIDE: 106 mmol/L (ref 101–111)
CO2: 25 mmol/L (ref 22–32)
CO2: 27 mmol/L (ref 22–32)
CO2: 27 mmol/L (ref 22–32)
CO2: 25 mmol/L (ref 22–32)
CO2: 21 mmol/L — AB (ref 22–32)
CREATININE: 0.95 mg/dL (ref 0.61–1.24)
CREATININE: 0.86 mg/dL (ref 0.61–1.24)
Calcium: 8.4 mg/dL — ABNORMAL LOW (ref 8.9–10.3)
Chloride: 106 mmol/L (ref 101–111)
Creatinine, Ser: 0.9 mg/dL (ref 0.61–1.24)
Creatinine, Ser: 0.84 mg/dL (ref 0.61–1.24)
Creatinine, Ser: 0.83 mg/dL (ref 0.61–1.24)
GFR calc Af Amer: >60 mL/min (ref >60)
GFR calc Af Amer: >60 mL/min (ref >60)
GFR calc Af Amer: >60 mL/min (ref >60)
GFR calc non Af Amer: >60 mL/min (ref >60)
GFR calc non Af Amer: >60 mL/min (ref >60)
GFR calc non Af Amer: >60 mL/min (ref >60)
GLUCOSE: 122 mg/dL — AB (ref 65–99)
GLUCOSE: 145 mg/dL — AB (ref 65–99)
GLUCOSE: 135 mg/dL — AB (ref 65–99)
Glucose, Bld: 120 mg/dL — ABNORMAL HIGH (ref 65–99)
Glucose, Bld: 151 mg/dL — ABNORMAL HIGH (ref 65–99)
PHOSPHORUS: 1.9 mg/dL — AB (ref 2.5–4.6)
POTASSIUM: 4.1 mmol/L (ref 3.5–5.1)
Phosphorus: 1.7 mg/dL — ABNORMAL LOW (ref 2.5–4.6)
Phosphorus: 1.8 mg/dL — ABNORMAL LOW (ref 2.5–4.6)
Phosphorus: 2 mg/dL — ABNORMAL LOW (ref 2.5–4.6)
Phosphorus: 2.7 mg/dL (ref 2.5–4.6)
Potassium: 4.1 mmol/L (ref 3.5–5.1)
Potassium: 3.9 mmol/L (ref 3.5–5.1)
Potassium: 4.4 mmol/L (ref 3.5–5.1)
Potassium: 4.3 mmol/L (ref 3.5–5.1)
SODIUM: 138 mmol/L (ref 135–145)
SODIUM: 138 mmol/L (ref 135–145)
SODIUM: 137 mmol/L (ref 135–145)
Sodium: 137 mmol/L (ref 135–145)
Sodium: 138 mmol/L (ref 135–145)

## 2015-11-06 LAB — COMPREHENSIVE METABOLIC PANEL
ALT: 37 U/L (ref 17–63)
ANION GAP: 7 (ref 5–15)
AST: 73 U/L — ABNORMAL HIGH (ref 15–41)
Albumin: 2.7 g/dL — ABNORMAL LOW (ref 3.5–5.0)
Alkaline Phosphatase: 78 U/L (ref 38–126)
BILIRUBIN TOTAL: 1.2 mg/dL (ref 0.3–1.2)
BUN: 12 mg/dL (ref 6–20)
CO2: 25 mmol/L (ref 22–32)
Calcium: 8.3 mg/dL — ABNORMAL LOW (ref 8.9–10.3)
Chloride: 106 mmol/L (ref 101–111)
Creatinine, Ser: 0.84 mg/dL (ref 0.61–1.24)
Glucose, Bld: 135 mg/dL — ABNORMAL HIGH (ref 65–99)
POTASSIUM: 3.9 mmol/L (ref 3.5–5.1)
Sodium: 138 mmol/L (ref 135–145)
TOTAL PROTEIN: 5.2 g/dL — AB (ref 6.5–8.1)

## 2015-11-06 LAB — CULTURE, BLOOD (ROUTINE X 2)
Culture: NO GROWTH
Culture: NO GROWTH

## 2015-11-06 LAB — MAGNESIUM: Magnesium: 1.8 mg/dL (ref 1.7–2.4)

## 2015-11-06 LAB — GLUCOSE, CAPILLARY
GLUCOSE-CAPILLARY: 120 mg/dL — AB (ref 65–99)
GLUCOSE-CAPILLARY: 134 mg/dL — AB (ref 65–99)
GLUCOSE-CAPILLARY: 136 mg/dL — AB (ref 65–99)
GLUCOSE-CAPILLARY: 136 mg/dL — AB (ref 65–99)
Glucose-Capillary: 117 mg/dL — ABNORMAL HIGH (ref 65–99)
Glucose-Capillary: 120 mg/dL — ABNORMAL HIGH (ref 65–99)
Glucose-Capillary: 130 mg/dL — ABNORMAL HIGH (ref 65–99)

## 2015-11-06 LAB — PHOSPHORUS
PHOSPHORUS: 2 mg/dL — AB (ref 2.5–4.6)
Phosphorus: 1.7 mg/dL — ABNORMAL LOW (ref 2.5–4.6)

## 2015-11-06 MED ORDER — DEXTROSE 5 % IV SOLN
15.0000 mmol | Freq: Once | INTRAVENOUS | Status: AC
  Administered 2015-11-06: 15 mmol via INTRAVENOUS
  Filled 2015-11-06: qty 5

## 2015-11-06 MED ORDER — DEXTROSE 5 % IV SOLN
10.0000 mmol | Freq: Once | INTRAVENOUS | Status: AC
  Administered 2015-11-06: 10 mmol via INTRAVENOUS
  Filled 2015-11-06: qty 3.33

## 2015-11-06 MED ORDER — HYDROCORTISONE NA SUCCINATE PF 100 MG IJ SOLR
50.0000 mg | Freq: Four times a day (QID) | INTRAMUSCULAR | Status: DC
  Administered 2015-11-06 – 2015-11-10 (×16): 50 mg via INTRAVENOUS
  Filled 2015-11-06 (×16): qty 2

## 2015-11-06 MED ORDER — VITAL HIGH PROTEIN PO LIQD
1000.0000 mL | ORAL | Status: DC
  Administered 2015-11-06: 1000 mL
  Administered 2015-11-07: 07:00:00

## 2015-11-06 MED ORDER — DOCUSATE SODIUM 50 MG/5ML PO LIQD
100.0000 mg | Freq: Two times a day (BID) | ORAL | Status: DC
  Administered 2015-11-06 – 2015-11-07 (×3): 100 mg via ORAL
  Filled 2015-11-06 (×3): qty 10

## 2015-11-06 MED ORDER — PRO-STAT SUGAR FREE PO LIQD
60.0000 mL | Freq: Three times a day (TID) | ORAL | Status: DC
  Administered 2015-11-06 – 2015-11-07 (×3): 60 mL

## 2015-11-06 MED ORDER — SENNOSIDES 8.8 MG/5ML PO SYRP
5.0000 mL | ORAL_SOLUTION | Freq: Two times a day (BID) | ORAL | Status: DC
  Administered 2015-11-06 – 2015-11-07 (×3): 5 mL via ORAL
  Filled 2015-11-06 (×3): qty 5

## 2015-11-06 MED FILL — Medication: Qty: 1 | Status: AC

## 2015-11-06 NOTE — Progress Notes (Signed)
[facility]  ROUNDING NOTE   Subjective:   Family has made patient DNR.  Sacral decubitus Norepinephrine 64mg Anuric  On CRRT 2.5Litre, BFR 350. No UF   PRVC FiO24%  Objective:  Vital signs in last 24 hours:  Temp:  [96.8 F (36 C)-97.5 F (36.4 C)] 97.4 F (36.3 C) (09/12 0800) Pulse Rate:  [56-100] 64 (09/12 0800) Resp:  [14-24] 14 (09/12 0800) BP: (76-144)/(49-124) 82/52 (09/12 0800) SpO2:  [98 %-100 %] 100 % (09/12 0832) FiO2 (%):  [24 %] 24 % (09/12 0832) Weight:  [76.2 kg (167 lb 15.9 oz)] 76.2 kg (167 lb 15.9 oz) (09/12 0500)  Weight change: 1.1 kg (2 lb 6.8 oz) Filed Weights   11/04/15 0500 11/05/15 0456 11/06/15 0500  Weight: 72.8 kg (160 lb 7.9 oz) 75.1 kg (165 lb 9.1 oz) 76.2 kg (167 lb 15.9 oz)    Intake/Output: I/O last 3 completed shifts: In: 3775.2 [I.V.:2326.8; NG/GT:390; IV Piggyback:1058.3] Out: 8166[Urine:45; Emesis/NG output:825; Other:1]   Intake/Output this shift:  Total I/O In: 65.6 [I.V.:65.6] Out: 0   Physical Exam: General: Critically ill   Head: +ETT, +OG   Eyes: Anicteric, eyes open  Neck: No JVD, No LAD  Lungs:  Intubated, PRVC FiO24%  Heart: S1S2, regular  Abdomen:  +distended  Extremities: No peripheral edema.  Neurologic: Able to follow commands  Skin: No lesions  Access: Right femoral temp HD catheter 9/9 Dr. RIsidore Moos   Basic Metabolic Panel:  Recent Labs Lab 11/03/15 0147  11/03/15 1801  11/04/15 0520  11/05/15 0530 11/05/15 1051 11/05/15 1746 11/05/15 2210 11/06/15 0203 11/06/15 0613  NA 138  < > 138  < > 140  < > 136 136 134* 137 138 137  K 4.1  < > 3.7  < > 4.0  < > 3.3* 3.7 4.3 4.2 4.1 3.9  CL 107  < > 107  < > 107  < > 104 103 103 106 106 106  CO2 19*  < > 24  < > 25  < > 25 27 25 24 25 27   GLUCOSE 81  < > 130*  < > 99  < > 114* 129* 122* 113* 122* 120*  BUN 91*  < > 67*  < > 40*  < > 20 17 15 13 14 12   CREATININE 4.46*  < > 3.36*  < > 1.91*  < > 1.04 1.02 0.96 0.96 0.95 0.90   CALCIUM 8.2*  < > 8.1*  < > 8.3*  < > 8.1* 8.1* 8.3* 8.4* 8.3* 8.3*  MG 2.0  --  2.0  --  1.7  --  1.7  --   --   --   --  1.8  PHOS 4.0  < > 3.1  3.2  < > 2.1*  2.3*  < > 1.9* 1.9* 2.5 2.2* 1.7* 1.7*  1.8*  < > = values in this interval not displayed.  Liver Function Tests:  Recent Labs Lab 11/02/15 0543 11/03/15 0147  11/05/15 1051 11/05/15 1746 11/05/15 2210 11/06/15 0203 11/06/15 0613  AST 38 48*  --   --   --   --   --   --   ALT 18 20  --   --   --   --   --   --   ALKPHOS 81 72  --   --   --   --   --   --   BILITOT 0.7 1.0  --   --   --   --   --   --  PROT 5.3* 5.2*  --   --   --   --   --   --   ALBUMIN 3.0* 2.8*  < > 2.5* 2.7* 2.7* 2.6* 2.7*  < > = values in this interval not displayed. No results for input(s): LIPASE, AMYLASE in the last 168 hours. No results for input(s): AMMONIA in the last 168 hours.  CBC:  Recent Labs Lab 11/02/15 0543 11/03/15 0147 11/04/15 0520 11/05/15 0530 11/06/15 0613  WBC 4.1 4.4 6.7 6.6 6.9  HGB 8.0* 7.9* 8.3* 7.7* 7.8*  HCT 23.6* 23.2* 24.5* 22.9* 23.0*  MCV 94.4 92.6 93.1 93.7 94.4  PLT 74* 80* 105* 83* 77*    Cardiac Enzymes:  Recent Labs Lab 11/16/2015 1011 11/03/2015 1717 11/12/2015 2218 11/02/15 0543  TROPONINI 0.16* 0.14* 0.13* 0.20*    BNP: Invalid input(s): POCBNP  CBG:  Recent Labs Lab 11/05/15 1604 11/05/15 2004 11/05/15 2335 11/06/15 0412 11/06/15 0806  GLUCAP 130* 117* 90 117* 120*    Microbiology: Results for orders placed or performed during the hospital encounter of 10/31/2015  Urine culture     Status: Abnormal   Collection Time: 11/22/2015 10:11 AM  Result Value Ref Range Status   Specimen Description URINE, RANDOM  Final   Special Requests NONE  Final   Culture >=100,000 COLONIES/mL KLEBSIELLA PNEUMONIAE (A)  Final   Report Status 11/03/2015 FINAL  Final   Organism ID, Bacteria KLEBSIELLA PNEUMONIAE (A)  Final      Susceptibility   Klebsiella pneumoniae - MIC*    AMPICILLIN >=32  RESISTANT Resistant     CEFAZOLIN <=4 SENSITIVE Sensitive     CEFTRIAXONE <=1 SENSITIVE Sensitive     CIPROFLOXACIN <=0.25 SENSITIVE Sensitive     GENTAMICIN <=1 SENSITIVE Sensitive     IMIPENEM <=0.25 SENSITIVE Sensitive     NITROFURANTOIN <=16 SENSITIVE Sensitive     TRIMETH/SULFA <=20 SENSITIVE Sensitive     AMPICILLIN/SULBACTAM 4 SENSITIVE Sensitive     PIP/TAZO <=4 SENSITIVE Sensitive     Extended ESBL NEGATIVE Sensitive     * >=100,000 COLONIES/mL KLEBSIELLA PNEUMONIAE  Culture, blood (routine x 2)     Status: None   Collection Time: 11/19/2015 11:13 AM  Result Value Ref Range Status   Specimen Description BLOOD RIGHT AC  Final   Special Requests   Final    BOTTLES DRAWN AEROBIC AND ANAEROBIC ANA 8ML AER 10ML   Culture NO GROWTH 5 DAYS  Final   Report Status 11/06/2015 FINAL  Final  Culture, blood (routine x 2)     Status: None   Collection Time: 11/23/2015 11:13 AM  Result Value Ref Range Status   Specimen Description BLOOD LEFT ARM  Final   Special Requests BOTTLES DRAWN AEROBIC AND ANAEROBIC [place]  Final   Culture NO GROWTH 5 DAYS  Final   Report Status 11/06/2015 FINAL  Final  Culture, blood (routine x 2)     Status: None (Preliminary result)   Collection Time: 11/02/15  2:17 AM  Result Value Ref Range Status   Specimen Description BLOOD RIGHT HAND  Final   Special Requests BOTTLES DRAWN AEROBIC AND ANAEROBIC 5CC  Final   Culture NO GROWTH 4 DAYS  Final   Report Status PENDING  Incomplete  Culture, blood (routine x 2)     Status: None (Preliminary result)   Collection Time: 11/02/15  2:28 AM  Result Value Ref Range Status   Specimen Description BLOOD LEFT HAND  Final   Special Requests BOTTLES DRAWN AEROBIC  AND ANAEROBIC [place]  Final   Culture NO GROWTH 4 DAYS  Final   Report Status PENDING  Incomplete  MRSA PCR Screening     Status: None   Collection Time: 11/02/15 10:42 AM  Result Value Ref Range Status   MRSA by PCR NEGATIVE NEGATIVE Final    Comment:        The  GeneXpert MRSA Assay (FDA approved for NASAL specimens only), is one component of a comprehensive MRSA colonization surveillance program. It is not intended to diagnose MRSA infection nor to guide or monitor treatment for MRSA infections.     Coagulation Studies: No results for input(s): LABPROT, INR in the last 72 hours.  Urinalysis: No results for input(s): COLORURINE, LABSPEC, PHURINE, GLUCOSEU, HGBUR, BILIRUBINUR, KETONESUR, PROTEINUR, UROBILINOGEN, NITRITE, LEUKOCYTESUR in the last 72 hours.  Invalid input(s): APPERANCEUR    Imaging: Dg Chest 1 View  Result Date: 11/05/2015 CLINICAL DATA:  Abdominal distention and renal failure. EXAM: CHEST 1 VIEW COMPARISON:  11/04/2015 FINDINGS: The support apparatus is stable. The endotracheal tube is 3.3 cm above the carina. Persistent cardiac enlargement, pulmonary edema, pleural effusions and bibasilar atelectasis. IMPRESSION: Stable support apparatus. Persistent cardiac enlargement, pulmonary edema, pleural effusions and atelectasis. Electronically Signed   By: Marijo Sanes M.D.   On: 11/05/2015 08:11   Dg Abd 1 View  Result Date: 11/05/2015 CLINICAL DATA:  Generalized abdominal pain and distension EXAM: ABDOMEN - 1 VIEW COMPARISON:  11/04/2015 FINDINGS: There is a nasogastric tube with tip in the body of the stomach. Right groin central venous catheter tip is in the projection of the IVC. Mild gaseous distension of the large bowel loops is similar to previous exam. IMPRESSION: 1. Stable gaseous distension of the large bowel loops. Electronically Signed   By: Kerby Moors M.D.   On: 11/05/2015 07:53     Medications:   . dextrose 35 mL/hr at 11/06/15 0800  . fentaNYL infusion INTRAVENOUS Stopped (11/06/15 1100)  . norepinephrine (LEVOPHED) Adult infusion 14 mcg/min (11/06/15 0842)  . pureflow 3 each (11/06/15 0520)   . aspirin  81 mg Oral Daily  . chlorhexidine  15 mL Mouth Rinse BID  . erythromycin  250 mg Intravenous Q8H  .  famotidine (PEPCID) IV  20 mg Intravenous Q24H  . feeding supplement (PRO-STAT SUGAR FREE 64)  30 mL Per Tube BID  . feeding supplement (VITAL HIGH PROTEIN)  1,000 mL Per Tube Q24H  . fentaNYL  50 mcg Intravenous Once  . heparin subcutaneous  5,000 Units Subcutaneous Q8H  . mouth rinse  15 mL Mouth Rinse Q2H  . piperacillin-tazobactam (ZOSYN)  IV  3.375 g Intravenous Q8H  . polyethylene glycol  17 g Oral BID  . sennosides  10 mL Oral BID  . sodium chloride flush  10-40 mL Intracatheter Q12H  . sodium chloride flush  3 mL Intravenous Q12H  . sodium chloride flush  3 mL Intravenous Q12H  . sodium phosphate  Dextrose 5% IVPB  10 mmol Intravenous Once   sodium chloride, sodium chloride, acetaminophen, fentaNYL, heparin, midazolam, ondansetron (ZOFRAN) IV, sodium chloride flush, sodium chloride flush  Assessment/ Plan:  Robert Parks is a 75 y.o. black male with coronary artery disease, systolic congestive heart failure, diabetes mellitus type II, gout, hypertension, hyperlipidemia, CVA. Admitted on 11/08/2015 for  Urinary tract infection, sepsis, acute respiratory failure and acute renal failure.   1. Acute renal failure on chronic kidney disease stage IV baseline creatinine 2.3 EGFR 29 followed by [facility] nephrology. Anuric requiring  CRRT. Acute renal failure from sepsis, urinary tract infection, hypotension and shock. Most likely ATN with poor chance of recovery.  - Continue CRRT while hemodynamically unstable. 4K bath. Currently with no UF.  - Replace electrolytes  2. Acute respiratory failure: on mechanical ventilation. PRVC FiO24% - appreciate pulmonary input  3. Anemia of chronic kidney disease: hemoglobin 7.8 - not currently on epo  4. Sepsis with urinary tract infection: E. Coli in urine culture - pip/tazo  Overall prognosis poor. Now DNR.    LOS: [place], Jamee Keach 9/12/20179:27 AM

## 2015-11-06 NOTE — Plan of Care (Addendum)
Problem: Education: Goal: Knowledge of [facility] Education information/materials will improve Outcome: Progressing BP (!) 82/52   Pulse 64   Temp 97.4 F (36.3 C) (Axillary)   Resp 14   Ht 5\' 8"  (1.727 m)   Wt 76.2 kg (167 lb 15.9 oz)   SpO2 100%   BMI 25.54 kg/m  POC reviewed with patient, will need to reinforce teaching unsure Pt level of understanding. Wife updated. cont on q2 turns, vital signs closely monitored and any concerns addressed at this time. Will continue to monitor.

## 2015-11-06 NOTE — Consult Note (Signed)
Consultation Note Date: 11/06/2015   Patient Name: Robert Parks  DOB: October 13, 1940  MRN: [id number]  Age / Sex: 75 y.o., male  PCP: [facility] Referring Physician: Laverle Hobby, MD  Reason for Consultation: Establishing goals of care and Psychosocial/spiritual support  HPI/Patient Profile: 74 y.o. male   admitted on 10/31/2015 withmalewith a known history of Angina, arthritis, CHF with ejection fraction 35%, chronic kidney disease baseline creatinine is around 2.5, coronary artery disease, degenerative disc disease, diabetes without complication, hyperlipidemia, hypertension- was recently admitted to hospital for CHF and also found to have BPH with urinary retention so he was sent home with the Foley catheter and advised to have urology clinic appointment.  Since discharged home with generalized weakness and poor po intake, family brought him back  to the hospital.  In ER he was noted to be running hypotension and his lactic acid was high with the many WBCs in his urine.  During admission subsequently went unresponsive, CODE BLUE CALLED patient with PEA and asystole for approx 10 mins, patient with cervical neck fractures, sacral decub ulcer.  Currently  intubated, on full vent support.  Unresponsive   Clinical Assessment and Goals of Care:  This NP Wadie Lessen reviewed medical records, received report from team, assessed the patient and then meet at the patient's bedside along with his wife and sister  to discuss diagnosis, prognosis, GOC, EOL wishes disposition and options.  A detailed discussion was had today regarding advanced directives.  Concepts specific to code status, artifical feeding and hydration, continued IV antibiotics and rehospitalization was had.   We discussed the concept of mortality, limitations of medical interventions and concept of overall failure to  thrive.   The difference between a aggressive medical intervention path  and a palliative comfort care path for this patient at this time was had.  Most importantly Values and goals of care important to patient  were attempted to be elicited.  Concept of Hospice and Palliative Care were discussed  Natural trajectory and expectations at EOL were discussed.  Questions and concerns addressed.    PMT will continue to support holistically.  Re-meet with the patient's three children and wife tomorrow at 3:00.    His wife verbalizes that she believes her husband would not to to continue "all this".        SUMMARY OF RECOMMENDATIONS    Code Status/Advance Care Planning:  DNR   Palliative Prophylaxis:   Aspiration, Delirium Protocol, Frequent Pain Assessment and Oral Care  Additional Recommendations (Limitations, Scope, Preferences):  At this time family remains hopeful for improvement, continue current treatment plan.  Psycho-social/Spiritual:   Desire for further Chaplaincy support:yes  Additional Recommendations: Education on Hospice  Prognosis:   Unable to determine to determine at this time, long term prognosis is grim.  Discharge Planning: to be determined     Primary Diagnoses: Present on Admission: . Cardiac arrest [facility])   I have reviewed the medical record, interviewed the patient and family, and examined the patient. The following aspects  are pertinent.  Past Medical History:  Diagnosis Date  . Anginal pain ([place])    had angina 2-3 mths prior to visit  . Arthritis   . CHF (congestive heart failure) ([place])   . Chronic kidney disease    follwed by Nephrologist Dr Juanito Doom in [place], Stage 3  . Coronary artery disease   . DDD (degenerative disc disease), cervical   . Diabetes mellitus without complication ([facility])    DIET CONTROLLED   . Dysphagia   . Frequent urination   . GERD (gastroesophageal reflux disease)   . Gout   . Hyperlipidemia   .  Hypertension   . MI (myocardial infarction) ([place])   . Orthopnea   . Pancytopenia ([place])   . Shortness of breath dyspnea    at rest, mainly when he's active  . Stroke [facility])    ?? small stroke om 2017  came and went   Social History   Social History  . Marital status: Married    Spouse name: N/A  . Number of children: N/A  . Years of education: N/A   Social History Main Topics  . Smoking status: Former Smoker    Years: 30.00    Types: Cigarettes  . Smokeless tobacco: Never Used     Comment: quit in his 34's  . Alcohol use No  . Drug use: No  . Sexual activity: Not Asked   Other Topics Concern  . None   Social History Narrative  . None   Family History  Problem Relation Age of Onset  . Heart attack Mother   . Cancer Father    Scheduled Meds: . aspirin  81 mg Oral Daily  . chlorhexidine  15 mL Mouth Rinse BID  . sennosides  5 mL Oral BID   And  . docusate  100 mg Oral BID  . erythromycin  250 mg Intravenous Q8H  . famotidine (PEPCID) IV  20 mg Intravenous Q24H  . feeding supplement (PRO-STAT SUGAR FREE 64)  60 mL Per Tube TID  . feeding supplement (VITAL HIGH PROTEIN)  1,000 mL Per Tube Q24H  . fentaNYL  50 mcg Intravenous Once  . heparin subcutaneous  5,000 Units Subcutaneous Q8H  . hydrocortisone sod succinate (SOLU-CORTEF) inj  50 mg Intravenous Q6H  . mouth rinse  15 mL Mouth Rinse Q2H  . piperacillin-tazobactam (ZOSYN)  IV  3.375 g Intravenous Q8H  . sodium chloride flush  10-40 mL Intracatheter Q12H  . sodium chloride flush  3 mL Intravenous Q12H  . sodium chloride flush  3 mL Intravenous Q12H  . sodium phosphate  Dextrose 5% IVPB  15 mmol Intravenous Once   Continuous Infusions: . dextrose 35 mL/hr at 11/06/15 0953  . fentaNYL infusion INTRAVENOUS Stopped (11/06/15 1100)  . norepinephrine (LEVOPHED) Adult infusion 11 mcg/min (11/06/15 1608)  . pureflow 3 each (11/06/15 1135)   PRN Meds:.sodium chloride, sodium chloride, acetaminophen, fentaNYL,  heparin, midazolam, ondansetron (ZOFRAN) IV, sodium chloride flush, sodium chloride flush Medications Prior to Admission:  Prior to Admission medications   Medication Sig Start Date End Date Taking? Authorizing Provider  acetaminophen (TYLENOL) 650 MG CR tablet Take 650 mg by mouth every 8 (eight) hours as needed.    Yes Historical Provider, MD  aspirin EC 81 MG tablet Take 81 mg by mouth. 08/13/12  Yes Historical Provider, MD  atorvastatin (LIPITOR) 10 MG tablet Take 10 mg by mouth daily.  10/01/15  Yes Historical Provider, MD  carvedilol (COREG) 3.125 MG tablet Take 1  tablet (3.125 mg total) by mouth 2 (two) times daily with a meal. 10/16/15  Yes Sital Mody, MD  colchicine 0.6 MG tablet Take 0.3-0.6 mg by mouth See admin instructions. Take 0.3mg  once daily to prevent gout, take 1.2mg  at first sign of gout flare, may follow in 1 hour with 0.6mg , max 3 tabs 11/11/11  Yes Historical Provider, MD  furosemide (LASIX) 80 MG tablet Take 1 tablet (80 mg total) by mouth 2 (two) times daily. 10/16/15  Yes Bettey Costa, MD  hydrALAZINE (APRESOLINE) 100 MG tablet Take 100 mg by mouth 2 (two) times daily.   Yes Historical Provider, MD  isosorbide mononitrate (IMDUR) 120 MG 24 hr tablet Take 240 mg by mouth daily.  05/30/15  Yes Historical Provider, MD  nitroGLYCERIN (NITROSTAT) 0.4 MG SL tablet Place 0.4 mg under the tongue every 5 (five) minutes as needed.  11/27/14  Yes Historical Provider, MD  potassium chloride SA (K-DUR,KLOR-CON) 20 MEQ tablet Take 20 mEq by mouth daily.   Yes Historical Provider, MD  ranolazine (RANEXA) 500 MG 12 hr tablet Take 1 tablet (500 mg total) by mouth 2 (two) times daily. 10/16/15  Yes Bettey Costa, MD  tamsulosin (FLOMAX) 0.4 MG CAPS capsule Take 0.4 mg by mouth daily.  11/27/14  Yes Historical Provider, MD   Allergies  Allergen Reactions  . No Known Allergies Other (See Comments)   Review of Systems  Unable to perform ROS   Physical Exam  Constitutional: He appears ill. He is  intubated.  Cardiovascular: Normal rate, regular rhythm and normal heart sounds.   Pulmonary/Chest: He is intubated.  Neurological: He is unresponsive.  Skin: Skin is warm and dry.    Vital Signs: BP (!) 84/58   Pulse 64   Temp 98.4 F (36.9 C) (Axillary)   Resp 17   Ht 5\' 8"  (1.727 m)   Wt 76.2 kg (167 lb 15.9 oz)   SpO2 98%   BMI 25.54 kg/m  Pain Assessment: CPOT   Pain Score: Asleep   SpO2: SpO2: 98 % O2 Device:SpO2: 98 % O2 Flow Rate: .   IO: Intake/output summary:  Intake/Output Summary (Last 24 hours) at 11/06/15 1700 Last data filed at 11/06/15 1500  Gross per 24 hour  Intake          2198.91 ml  Output              841 ml  Net          1357.91 ml    LBM: Last BM Date: 11/06/15 Baseline Weight: Weight: 64 kg (141 lb) Most recent weight: Weight: 76.2 kg (167 lb 15.9 oz)     Palliative Assessment/Data:   Flowsheet Rows   Flowsheet Row Most Recent Value  Intake Tab  Referral Department  Hospitalist  Unit at Time of Referral  ICU  Palliative Care Primary Diagnosis  Cardiac  Date Notified  11/02/15  Palliative Care Type  New Palliative care  Reason for referral  Clarify Goals of Care  Date of Admission  11/16/2015  Date first seen by Palliative Care  11/06/15  # of days Palliative referral response time  4 Day(s)  # of days IP prior to Palliative referral  1  Clinical Assessment  Psychosocial & Spiritual Assessment  Palliative Care Outcomes      Time In: 0730 Time Out: 0845 Time Total: 75 min Greater than 50%  of this time was spent counseling and coordinating care related to the above assessment and plan.  Signed  by: Wadie Lessen, NP   Please contact Palliative Medicine Team phone at [phone] for questions and concerns.  For individual provider: See Shea Evans

## 2015-11-06 NOTE — Progress Notes (Signed)
Pt's phosphorus 1.7; [place], NP informed.  New orders received.  Will continue to monitor.

## 2015-11-06 NOTE — Progress Notes (Addendum)
Nutrition Follow-up  DOCUMENTATION CODES:   Not applicable  INTERVENTION:  -Discussed nutritional poc during ICU rounds with MD Kasa; if unable to extubate today, plan to start trophic feedings (Vital High Protein at 20 ml/hr) with addition of Prostat 60 mL (2 packets) TID to meet protein needs.  If tolerating, goal is Vital High Protein at 65 ml/hr with Prostat daily providing 1660 kcals, 152 g of protein. Continue to assess   NUTRITION DIAGNOSIS:   Inadequate oral intake related to acute illness, poor appetite as evidenced by NPO status, per patient/family report.  Being addressed via TF if unable to extubate  GOAL:   Patient will meet greater than or equal to 90% of their needs  MONITOR:   Vent status, Labs, Weight trends  REASON FOR ASSESSMENT:   Ventilator, Consult Poor PO  ASSESSMENT:    75 yo male with history of angina, arthritis, CHF with ejection fraction 35%, CKD, was brought to ER with generalized weakness, hypotension, lactic acid high with leukocytosis. Patient went unresponsive, Code Blue called with PEA and asystole for approx 10 means, cervical neck fxs, sacral decub ulcer, now intubated on full vent support  Pt remains on vent, on CRRT with no UF at present, remains anuric,  levophed at 15 mcg/min  Diet Order:  Diet NPO time specified  Skin:  Wound (see comment) (stage II on sacrum and buttock)  Last BM:  9/12  Labs: phosphorus 2.0 (supplementing)  Meds: fentanyl, levophed  Height:   Ht Readings from Last 1 Encounters:  11/21/2015 5\' 8"  (1.727 m)    Weight:   Wt Readings from Last 1 Encounters:  11/06/15 167 lb 15.9 oz (76.2 kg)   Filed Weights   11/04/15 0500 11/05/15 0456 11/06/15 0500  Weight: 160 lb 7.9 oz (72.8 kg) 165 lb 9.1 oz (75.1 kg) 167 lb 15.9 oz (76.2 kg)    Ideal Body Weight:  70 kg  BMI:  Body mass index is 25.54 kg/m.  Estimated Nutritional Needs:   Kcal:  1625 kcals  Protein:  >/= 150 g  Fluid:  >/= 1.6  L  EDUCATION NEEDS:   No education needs identified at this time  [place], [place], [place] [phone] Pager  [phone] Weekend/On-Call Pager

## 2015-11-06 NOTE — Progress Notes (Signed)
For CRRT: venous pressure increased by over 100 mmHg between 0800 and 0900. No kinks in tubing, persisted even with reposition of patient After such steady pressures around 120 over night, RN flushed dialysis catheter, no resistance to flush and very easy to draw back blood. Discussed with nephrologist when he rounded in person at 0900. Will rinse back patient's blood and use new cartridge per discussion with nephrologist.

## 2015-11-06 NOTE — Progress Notes (Addendum)
[facility] consulted for piperacillin/tazobactam dosing, constipation and electrolyten management in this 75 yo AAM admitted for sepsis from UTI and acute cardiac arrest with multiorgan failure.  Pt currently on CRRT.  1. Piperacillin/tazobactam: Indication is for UTI and possible aspiration PNA. Patient currently on day 5 of piperacillin/tazobactam 3.375g IV q 8. Per rounds, will d/c after day 7 (9/14).  2. Constipation: Patient had small bowel movement 09/12 @ 0600. Continued medications: Erythromycin 250 mg q 8 hours Medication changes: sennosides 10 mL syrup bid change to sennosides 5 mL + docusate 100 mg BID Stopped medications: Polyethylene glycol 17 g bid and lactulose 30 g   3. Electrolytes: 9/12 am phos = 1.7. Patient received sodium phos 10 mmol x 1. Renal function labs ordered q 4 hours. Magnesium ordered with 09/13 am labs. Pharmacy will continue to monitor. 9/12 pm phos update: level came back at 1.9. Pt being dosed another 15 mmol tonight(9/12). Will check again with am with labs.   Allergies  Allergen Reactions  . No Known Allergies Other (See Comments)    Patient Measurements: Height: 5\' 8"  (172.7 cm) Weight: 167 lb 15.9 oz (76.2 kg) IBW/kg (Calculated) : 68.4   Vital Signs: Temp: 98.4 F (36.9 C) (09/12 1100) Temp Source: Axillary (09/12 1100) BP: 83/57 (09/12 1100) Pulse Rate: 63 (09/12 1100) Intake/Output from previous day: 09/11 0701 - 09/12 0700 In: 2763.1 [I.V.:1394.7; NG/GT:310; IV Piggyback:1058.3] Out: 722 [Urine:22; Emesis/NG output:700] Intake/Output from this shift: Total I/O In: 551.4 [I.V.:311.4; Other:20; NG/GT:170; IV Piggyback:50] Out: 5 [Urine:5]  Labs:  Recent Labs  11/04/15 0520 11/05/15 0530 11/06/15 0613  WBC 6.7 6.6 6.9  HGB 8.3* 7.7* 7.8*  HCT 24.5* 22.9* 23.0*  PLT 105* 83* 77*     Recent Labs  11/04/15 0520  11/05/15 0530  11/06/15 0203 11/06/15 0613 11/06/15 1024  NA 140  < > 136  < > 138 137 138   K 4.0  < > 3.3*  < > 4.1 3.9 3.9  CL 107  < > 104  < > 106 106 106  CO2 25  < > 25  < > 25 27 25   GLUCOSE 99  < > 114*  < > 122* 120* 135*  BUN 40*  < > 20  < > 14 12 12   CREATININE 1.91*  < > 1.04  < > 0.95 0.90 0.84  CALCIUM 8.3*  < > 8.1*  < > 8.3* 8.3* 8.3*  MG 1.7  --  1.7  --   --  1.8  --   PHOS 2.1*  2.3*  < > 1.9*  < > 1.7* 1.7*  1.8* 2.0*  PROT  --   --   --   --   --   --  5.2*  ALBUMIN 2.8*  < > 2.5*  < > 2.6* 2.7* 2.7*  AST  --   --   --   --   --   --  73*  ALT  --   --   --   --   --   --  37  ALKPHOS  --   --   --   --   --   --  78  BILITOT  --   --   --   --   --   --  1.2  < > = values in this interval not displayed. Estimated Creatinine Clearance: 73.5 mL/min (by C-G formula based on SCr of 0.84 mg/dL).  Recent Labs  11/05/15 2335 11/06/15 0412 11/06/15 0806  GLUCAP 90 117* 120*    Medical History: Past Medical History:  Diagnosis Date  . Anginal pain ([place])    had angina 2-3 mths prior to visit  . Arthritis   . CHF (congestive heart failure) ([place])   . Chronic kidney disease    follwed by Nephrologist Dr Juanito Doom in [place], Stage 3  . Coronary artery disease   . DDD (degenerative disc disease), cervical   . Diabetes mellitus without complication (HCC)    DIET CONTROLLED   . Dysphagia   . Frequent urination   . GERD (gastroesophageal reflux disease)   . Gout   . Hyperlipidemia   . Hypertension   . MI (myocardial infarction) ([place])   . Orthopnea   . Pancytopenia ([place])   . Shortness of breath dyspnea    at rest, mainly when he's active  . Stroke [facility])    ?? small stroke om 2017  came and went     Darrow Bussing, PharmD Pharmacy Resident 11/06/2015 11:44 AM

## 2015-11-06 NOTE — Progress Notes (Signed)
* [facility]  Patient Profile: 75 yo AAM with systolic CHF [id number], multiple medical issues admitted for sepsis from UTI with acidosis>> ICU due to to acute cardiac arrest now with ARF on CRRT.   Subjective: Patient continues to remain on vent, on CRRT, was hypothermic. No acute issues overnight.  PULMONARY A: Acute Respiratory Failure s/p PEA  arrest --Vent: PRVC/12/450/5/24% P: -Pt is stable on vent, however he continues on CRRT and pressor support. -Continue Bronchodilator Therapy -ABG and CXR DAILY PRN .   CARDIOVASCULAR A: Cardiac aarest Cardiogenic shock Bradycardiac with HR in the 50s-HR improved to NSR in the 60s CHF P: -Continue low dose levophed to maintain MAP>65 or SBP>90 -Hemodynamic per ICU -PRN atropine if HR LE 30  RENAL A: Pt had CKD before admission, not with ARF.  Hypokalemia Hypophosphatemia P: -Nephrology following; continue CRRT per nephro. -follow chem 7 -follow Is/Os -continue Foley Catheter -CRRT fluids adjusted for low K+ -Replace electrolytes per ICU protocol  GASTROINTESTINAL A: Increasing abd distension seen over past few days, review of imaging shows dilated bowel loops. This is likely c/w ileus from recent cardiac arrest.  P: Stop tube feeds, continue NGT to suction for bowel rest.  Famotidine for GIP D10 while tube feeds are on hold. BM ON 11/06/15  HEMATOLOGIC Anemia of chronic disease P: Follow CBC Transfuse if Hg<7  INFECTIOUS A: Urosepsis --CXR suggestive of right lung pneumonia vs. Atelectasis.  P: -Empric abx with zosyn -F/U cultures  ENDOCRINE A: Hypoglycemia P: -Monitor blood glucose  NEUROLOGIC AMS High risk for anoxic brain injury secondary to cardiac arrest P: -Keep  intubated and sedated -Minimal sedation to achieve a RASS goal: -1 -Awaiting neurology input  Allergies:  No known allergies  Physical Examination:   VS: BP 93/61   Pulse 67   Temp 97 F (36.1 C)    Resp (!) 21   Ht 5\' 8"  (1.727 m)   Wt 75.1 kg (165 lb 9.1 oz)   SpO2 100%   BMI 25.17 kg/m   General Appearance: Awake, nods in approval or denial  Neuro:Following basic commands HEENT: PERRLA, EOM intact. Pulmonary: normal breath sounds, No wheezing.   Cardiovascular: Normal S1,S2.  No m/r/g.   Abdomen: Benign, Soft, non-tender. Renal:  No costovertebral tenderness  GU:  Not performed at this time. Endoc: No evident thyromegaly, no signs of acromegaly. Skin:   warm, no rash. Extremities: normal, no cyanosis, clubbing.   LABORATORY PANEL:   CBC  Recent Labs Lab 11/05/15 0530  WBC 6.6  HGB 7.7*  HCT 22.9*  PLT 83*   ------------------------------------------------------------------------------------------------------------------  Chemistries   Recent Labs Lab 11/03/15 0147  11/05/15 0530  11/06/15 0203  NA 138  < > 136  < > 138  K 4.1  < > 3.3*  < > 4.1  CL 107  < > 104  < > 106  CO2 19*  < > 25  < > 25  GLUCOSE 81  < > 114*  < > 122*  BUN 91*  < > 20  < > 14  CREATININE 4.46*  < > 1.04  < > 0.95  CALCIUM 8.2*  < > 8.1*  < > 8.3*  MG 2.0  < > 1.7  --   --   AST 48*  --   --   --   --   ALT 20  --   --   --   --   ALKPHOS 72  --   --   --   --  BILITOT 1.0  --   --   --   --   < > = values in this interval not displayed. ------------------------------------------------------------------------------------------------------------------  Cardiac Enzymes  Recent Labs Lab 11/02/15 0543  TROPONINI 0.20*   Dg Chest 1 View  Result Date: 11/05/2015 CLINICAL DATA:  Abdominal distention and renal failure. EXAM: CHEST 1 VIEW COMPARISON:  11/04/2015 FINDINGS: The support apparatus is stable. The endotracheal tube is 3.3 cm above the carina. Persistent cardiac enlargement, pulmonary edema, pleural effusions and bibasilar atelectasis. IMPRESSION: Stable support apparatus. Persistent cardiac enlargement, pulmonary edema, pleural effusions and atelectasis.  Electronically Signed   By: Marijo Sanes M.D.   On: 11/05/2015 08:11   Dg Abd 1 View  Result Date: 11/05/2015 CLINICAL DATA:  Generalized abdominal pain and distension EXAM: ABDOMEN - 1 VIEW COMPARISON:  11/04/2015 FINDINGS: There is a nasogastric tube with tip in the body of the stomach. Right groin central venous catheter tip is in the projection of the IVC. Mild gaseous distension of the large bowel loops is similar to previous exam. IMPRESSION: 1. Stable gaseous distension of the large bowel loops. Electronically Signed   By: Kerby Moors M.D.   On: 11/05/2015 07:53       Bincy Varughese,AG-ACNP Pulmonary & Critical Care

## 2015-11-07 ENCOUNTER — Ambulatory Visit: Payer: Self-pay | Admitting: Urology

## 2015-11-07 DIAGNOSIS — Z515 Encounter for palliative care: Secondary | ICD-10-CM

## 2015-11-07 DIAGNOSIS — N189 Chronic kidney disease, unspecified: Secondary | ICD-10-CM

## 2015-11-07 DIAGNOSIS — N179 Acute kidney failure, unspecified: Secondary | ICD-10-CM

## 2015-11-07 LAB — GLUCOSE, CAPILLARY
GLUCOSE-CAPILLARY: 157 mg/dL — AB (ref 65–99)
GLUCOSE-CAPILLARY: 136 mg/dL — AB (ref 65–99)
Glucose-Capillary: 77 mg/dL (ref 65–99)
Glucose-Capillary: 146 mg/dL — ABNORMAL HIGH (ref 65–99)
Glucose-Capillary: 141 mg/dL — ABNORMAL HIGH (ref 65–99)

## 2015-11-07 LAB — BLOOD GAS, ARTERIAL
Acid-base deficit: 5.7 mmol/L — ABNORMAL HIGH (ref 0.0–2.0)
BICARBONATE: 20.7 mmol/L (ref 20.0–28.0)
FIO2: 0.24
O2 SAT: 89.4 %
PATIENT TEMPERATURE: 37
PEEP: 5 cmH2O
PH ART: 7.28 — AB (ref 7.350–7.450)
Pressure support: 8 cmH2O
pCO2 arterial: 44 mmHg (ref 32.0–48.0)
pO2, Arterial: 65 mmHg — ABNORMAL LOW (ref 83.0–108.0)

## 2015-11-07 LAB — CULTURE, BLOOD (ROUTINE X 2)
Culture: NO GROWTH
Culture: NO GROWTH

## 2015-11-07 LAB — RENAL FUNCTION PANEL
ANION GAP: 8 (ref 5–15)
Albumin: 2.6 g/dL — ABNORMAL LOW (ref 3.5–5.0)
Albumin: 2.6 g/dL — ABNORMAL LOW (ref 3.5–5.0)
Albumin: 2.6 g/dL — ABNORMAL LOW (ref 3.5–5.0)
Albumin: 2.6 g/dL — ABNORMAL LOW (ref 3.5–5.0)
Anion gap: 8 (ref 5–15)
Anion gap: 9 (ref 5–15)
Anion gap: 8 (ref 5–15)
BUN: 14 mg/dL (ref 6–20)
BUN: 13 mg/dL (ref 6–20)
BUN: 14 mg/dL (ref 6–20)
BUN: 15 mg/dL (ref 6–20)
CALCIUM: 8.4 mg/dL — AB (ref 8.9–10.3)
CALCIUM: 8.5 mg/dL — AB (ref 8.9–10.3)
CHLORIDE: 107 mmol/L (ref 101–111)
CHLORIDE: 105 mmol/L (ref 101–111)
CHLORIDE: 106 mmol/L (ref 101–111)
CHLORIDE: 106 mmol/L (ref 101–111)
CO2: 23 mmol/L (ref 22–32)
CO2: 23 mmol/L (ref 22–32)
CO2: 25 mmol/L (ref 22–32)
CO2: 24 mmol/L (ref 22–32)
CREATININE: 0.85 mg/dL (ref 0.61–1.24)
CREATININE: 0.83 mg/dL (ref 0.61–1.24)
CREATININE: 0.84 mg/dL (ref 0.61–1.24)
Calcium: 8.5 mg/dL — ABNORMAL LOW (ref 8.9–10.3)
Calcium: 8.3 mg/dL — ABNORMAL LOW (ref 8.9–10.3)
Creatinine, Ser: 0.84 mg/dL (ref 0.61–1.24)
GFR calc Af Amer: >60 mL/min (ref >60)
GFR calc Af Amer: >60 mL/min (ref >60)
GFR calc non Af Amer: >60 mL/min (ref >60)
GFR calc non Af Amer: >60 mL/min (ref >60)
GLUCOSE: 144 mg/dL — AB (ref 65–99)
GLUCOSE: 158 mg/dL — AB (ref 65–99)
Glucose, Bld: 140 mg/dL — ABNORMAL HIGH (ref 65–99)
Glucose, Bld: 163 mg/dL — ABNORMAL HIGH (ref 65–99)
POTASSIUM: 3.9 mmol/L (ref 3.5–5.1)
Phosphorus: 2.3 mg/dL — ABNORMAL LOW (ref 2.5–4.6)
Phosphorus: 2.1 mg/dL — ABNORMAL LOW (ref 2.5–4.6)
Phosphorus: 2 mg/dL — ABNORMAL LOW (ref 2.5–4.6)
Phosphorus: 2.4 mg/dL — ABNORMAL LOW (ref 2.5–4.6)
Potassium: 4.3 mmol/L (ref 3.5–5.1)
Potassium: 4.1 mmol/L (ref 3.5–5.1)
Potassium: 3.9 mmol/L (ref 3.5–5.1)
SODIUM: 137 mmol/L (ref 135–145)
SODIUM: 139 mmol/L (ref 135–145)
Sodium: 138 mmol/L (ref 135–145)
Sodium: 138 mmol/L (ref 135–145)

## 2015-11-07 LAB — MAGNESIUM: MAGNESIUM: 1.7 mg/dL (ref 1.7–2.4)

## 2015-11-07 LAB — TRIGLYCERIDES: TRIGLYCERIDES: 91 mg/dL (ref <150)

## 2015-11-07 MED ORDER — MAGNESIUM SULFATE IN D5W 1-5 GM/100ML-% IV SOLN
1.0000 g | Freq: Once | INTRAVENOUS | Status: AC
  Administered 2015-11-07: 1 g via INTRAVENOUS
  Filled 2015-11-07: qty 100

## 2015-11-07 MED ORDER — FENTANYL CITRATE (PF) 100 MCG/2ML IJ SOLN
25.0000 ug | INTRAMUSCULAR | Status: DC | PRN
  Administered 2015-11-08: 25 ug via INTRAVENOUS
  Filled 2015-11-07: qty 2

## 2015-11-07 MED ORDER — PRO-STAT SUGAR FREE PO LIQD
30.0000 mL | Freq: Every day | ORAL | Status: DC
Start: 2015-11-08 — End: 2015-11-09

## 2015-11-07 MED ORDER — VITAL HIGH PROTEIN PO LIQD
1000.0000 mL | ORAL | Status: DC
  Administered 2015-11-07: 1000 mL

## 2015-11-07 MED ORDER — PROPOFOL 1000 MG/100ML IV EMUL
5.0000 ug/kg/min | INTRAVENOUS | Status: DC
  Administered 2015-11-07: 5 ug/kg/min via INTRAVENOUS
  Administered 2015-11-08: 15 ug/kg/min via INTRAVENOUS
  Filled 2015-11-07 (×2): qty 100

## 2015-11-07 MED ORDER — SODIUM PHOSPHATES 45 MMOLE/15ML IV SOLN
15.0000 mmol | Freq: Once | INTRAVENOUS | Status: AC
  Administered 2015-11-07: 15 mmol via INTRAVENOUS
  Filled 2015-11-07: qty 5

## 2015-11-07 NOTE — Progress Notes (Signed)
Initial Nutrition Assessment  DOCUMENTATION CODES:   Not applicable  INTERVENTION:  -TF: discussed nutritional poc during ICU rounds with MD Kasa; MD agreeable to begin titrating TF as tolerated to goal rate. Recommend increasing to rate of 30 ml/hr, titrate by 10 mL q 4 hours until goal of 65 ml/hr met. Change Prostat to daily as previously recommended   NUTRITION DIAGNOSIS:   Inadequate oral intake related to acute illness, poor appetite as evidenced by NPO status, per patient/family report.  Being addressed via TF  GOAL:   Patient will meet greater than or equal to 90% of their needs  MONITOR:   Vent status, Labs, Weight trends  REASON FOR ASSESSMENT:   Ventilator, Consult Poor PO  ASSESSMENT:    75 yo male with history of angina, arthritis, CHF with ejection fraction 35%, CKD, was brought to ER with generalized weakness, hypotension, lactic acid high with leukocytosis. Patient went unresponsive, Code Blue called with PEA and asystole for approx 10 means, cervical neck fxs, sacral decub ulcer, now intubated on full vent support   Pt remains on vent, off sedation, CRRT continues, levophed at 5 mcg/min  Tolerating Vital High Protein at rate of 20 ml/hr, +large BM q shift per Inocencio Homes.   Diet Order:  Diet NPO time specified  Skin:  Wound (see comment) (stage II on sacrum and buttock)  Last BM:  9/12   Labs: phosphorus 2.1 (supplementing)  Glucose Profile:  Recent Labs  11/06/15 2348 11/07/15 0402 11/07/15 0732  GLUCAP 136* 77 146*    Meds: reviewed, D10 discontinued  Height:   Ht Readings from Last 1 Encounters:  10/30/2015 _0  (1.727 m)    Weight:   Wt Readings from Last 1 Encounters:  11/07/15 171 lb 15.3 oz (78 kg)    Ideal Body Weight:  70 kg  BMI:  Body mass index is 26.15 kg/m.  Estimated Nutritional Needs:   Kcal:  1625 kcals  Protein:  >/= 150 g  Fluid:  >/= 1.6 L  EDUCATION NEEDS:   No education needs identified at this  time  [place], [place], [place] [phone] Pager  [phone] Weekend/On-Call Pager

## 2015-11-07 NOTE — Progress Notes (Signed)
Daily Progress Note   Patient Name: Robert Parks       Date: 11/07/2015 DOB: 19-Feb-1941  Age: 75 y.o. MRN#: [id number] Attending Physician: Laverle Hobby, MD Primary Care Physician: [place] Date: 10/28/2015  Reason for Consultation/Follow-up: Establishing GOC and emotional support  Subjective:  This NP Wadie Lessen reviewed medical records, received report from team, assessed the patient and then re-meet at the patient's bedside along with his family to include wife and three daughters  to discuss diagnosis, prognosis, GOC, EOL wishes disposition and options.  A discussion was had today regarding advanced directives.  Concepts specific to code status, artifical feeding and hydration,dialysis,  continued IV antibiotics and rehospitalization was had.  The difference between a aggressive medical intervention path  and a palliative comfort care path for this patient at this time was had.     Values and goals of care important to patient and family were attempted to be elicited.    We discussed the importance of keeping the patient's wishes at the center of all decisions for this patient.    Concerns expressed the the long term poor prognosis and likely dependance on dialysis.    We discussed the concept of mortality and the limitations of medical intervetnions   Questions and concerns addressed.   Family encouraged to call with questions or concerns.  PMT will continue to support holistically.   Length of Stay: 6  Current Medications: Scheduled Meds:  . aspirin  81 mg Oral Daily  . chlorhexidine  15 mL Mouth Rinse BID  . sennosides  5 mL Oral BID   And  . docusate  100 mg Oral BID  . famotidine (PEPCID) IV  20 mg Intravenous Q24H  . [START ON  11/08/2015] feeding supplement (PRO-STAT SUGAR FREE 64)  30 mL Per Tube Daily  . fentaNYL  50 mcg Intravenous Once  . heparin subcutaneous  5,000 Units Subcutaneous Q8H  . hydrocortisone sod succinate (SOLU-CORTEF) inj  50 mg Intravenous Q6H  . mouth rinse  15 mL Mouth Rinse Q2H  . piperacillin-tazobactam (ZOSYN)  IV  3.375 g Intravenous Q8H  . sodium chloride flush  10-40 mL Intracatheter Q12H  . sodium chloride flush  3 mL Intravenous Q12H  . sodium phosphate  Dextrose 5% IVPB  15 mmol Intravenous Once  Continuous Infusions: . feeding supplement (VITAL HIGH PROTEIN) 1,000 mL (11/07/15 1030)  . fentaNYL infusion INTRAVENOUS Stopped (11/07/15 0733)  . norepinephrine (LEVOPHED) Adult infusion Stopped (11/07/15 1300)  . pureflow 3 each (11/07/15 1100)    PRN Meds: sodium chloride, sodium chloride, acetaminophen, fentaNYL, heparin, midazolam, ondansetron (ZOFRAN) IV, sodium chloride flush, sodium chloride flush  Physical Exam  Constitutional: He appears well-developed. He appears ill. He is intubated.  Cardiovascular: Normal rate, regular rhythm and normal heart sounds.   Pulmonary/Chest: He is intubated.  Skin: Skin is warm and dry.            Vital Signs: BP (!) 99/57   Pulse 64   Temp (!) 96.3 F (35.7 C) (Axillary)   Resp (!) 21   Ht 5\' 8"  (1.727 m)   Wt 78 kg (171 lb 15.3 oz)   SpO2 100%   BMI 26.15 kg/m  SpO2: SpO2: 100 % O2 Device: O2 Device: Ventilator O2 Flow Rate:    Intake/output summary:  Intake/Output Summary (Last 24 hours) at 11/07/15 1348 Last data filed at 11/07/15 1305  Gross per 24 hour  Intake          3121.21 ml  Output               92 ml  Net          3029.21 ml   LBM: Last BM Date: 11/06/15 Baseline Weight: Weight: 64 kg (141 lb) Most recent weight: Weight: 78 kg (171 lb 15.3 oz)       Palliative Assessment/Data:  20%    Flowsheet Rows   Flowsheet Row Most Recent Value  Intake Tab  Referral Department  Hospitalist  Unit at Time of  Referral  ICU  Palliative Care Primary Diagnosis  Cardiac  Date Notified  11/02/15  Palliative Care Type  New Palliative care  Reason for referral  Clarify Goals of Care  Date of Admission  10/26/2015  Date first seen by Palliative Care  11/06/15  # of days Palliative referral response time  4 Day(s)  # of days IP prior to Palliative referral  1  Clinical Assessment  Psychosocial & Spiritual Assessment  Palliative Care Outcomes      Patient Active Problem List   Diagnosis Date Noted  . Palliative care by specialist 11/07/2015  . Acute on chronic renal failure ([place])   . Sepsis ([place]) 11/05/2015  . Acute renal failure ([place])   . Cardiac arrest ([place]) 11/02/2015  . Acute respiratory failure ([place]) 11/02/2015  . UTI (lower urinary tract infection) 11/14/2015  . Chronic systolic heart failure ([place]) 11/24/2015  . Hypotension 10/31/2015  . Dysphagia 11/13/2015  . Pressure ulcer 10/13/2015  . ITP (idiopathic thrombocytopenic purpura) 08/06/2015  . Spinal stenosis of cervical region 07/18/2015  . Left-sided weakness 05/16/2015  . Neck pain 05/16/2015  . CKD (chronic kidney disease) stage 3, GFR 30-59 ml/min 08/25/2013  . Benign localized prostatic hyperplasia with lower urinary tract symptoms (LUTS) 11/05/2011  . Chronic prostatitis 11/05/2011  . Elevated prostate specific antigen (PSA) 11/05/2011  . ED (erectile dysfunction) of organic origin 11/05/2011  . Intermediate coronary syndrome ([place]) 05/21/2011  . Coronary artery disease 04/20/2011  . Gout 04/20/2011  . Hypertension, benign 04/20/2011  . Type II diabetes mellitus ([place]) 04/20/2011    Palliative Care Assessment & Plan     Recommendations/Plan:  Continue current treatment plan, family is hopeful for continued improvement.  They verbalize understanding, having discussed with Dr Mortimer Fries, that patient is ready  for extubation in the morning.   He will not be re- intubated in event of decline.  If he decompensates family will  have more specific decisions to make.  Continue support and education to help family navigate this complicated situation  PMT will continue to support holistically     Code Status:    Code Status Orders        Start     Ordered   11/05/15 1108  Do not attempt resuscitation (DNR)  Continuous    Question Answer Comment  In the event of cardiac or respiratory ARREST Do not call a "code blue"   In the event of cardiac or respiratory ARREST Do not perform Intubation, CPR, defibrillation or ACLS   In the event of cardiac or respiratory ARREST Use medication by any route, position, wound care, and other measures to relive pain and suffering. May use oxygen, suction and manual treatment of airway obstruction as needed for comfort.      11/05/15 1107    Code Status History    Date Active Date Inactive Code Status Order ID Comments User Context   11/02/2015  1:11 AM 11/05/2015 11:07 AM Full Code [id number]  Flora Lipps, MD Inpatient   11/09/2015  2:53 PM 11/02/2015  1:11 AM Full Code [id number]  Vaughan Basta, MD Inpatient   10/11/2015  5:31 PM 10/12/2015  6:04 PM Full Code [id number]  Demetrios Loll, MD ED       Prognosis:  Overall long term poor prognosis  Discharge Planning:  To be determined  Care plan was discussed with nursing  Thank you for allowing the Palliative Medicine Team to assist in the care of this patient.   Time In: 1500 Time Out: 1600 Total Time 60 min Prolonged Time Billed  no      Greater than 50%  of this time was spent counseling and coordinating care related to the above assessment and plan.  Wadie Lessen, NP  Please contact Palliative Medicine Team phone at [phone] for questions and concerns.

## 2015-11-07 NOTE — Plan of Care (Signed)
Problem: Physical Regulation: Goal: Ability to maintain clinical measurements within normal limits will improve Outcome: Progressing Patient continued CRRT with UF added per nephrologist. Able to titrate off levophed during shift but had to restart due to low pressure after a few hours. Tube feeding titrated per orders. Patient's abdomen still distended, bowel sounds hypoactive, did have one large liquid bowel movement during shift. Patient too lethargic and drowsy for extubation when assessed by RN and MD in afternoon. MD changed sedation orders and plan to try again in morning. Patient would follow some simple commands, but not consistently.

## 2015-11-07 NOTE — H&P (Signed)
PULMONARY / CRITICAL CARE MEDICINE   Name: Robert Parks MRN: [id number] DOB: 1940-03-11    ADMISSION DATE:  11/23/2015   CHIEF COMPLAINT:  Acute cardiac arrest  SIGNIFICANT EVENTS: S/p cardiac arrest Asystole/PEA   HISTORY OF PRESENT ILLNESS:  Remains intubated,sedated On full vent support   9/8 unresponsive CODE BLUE CALLED patient with PEA and asystole for approx 10 mins, sacral decub ulcer,  9/10 started CRRT 9/11 failed SAT/SBT 9/12 failed SAT/SBT     Prior to Admission medications   Medication Sig Start Date End Date Taking? Authorizing Provider  acetaminophen (TYLENOL) 650 MG CR tablet Take 650 mg by mouth every 8 (eight) hours as needed.    Yes Historical Provider, MD  aspirin EC 81 MG tablet Take 81 mg by mouth. 08/13/12  Yes Historical Provider, MD  atorvastatin (LIPITOR) 10 MG tablet Take 10 mg by mouth daily.  10/01/15  Yes Historical Provider, MD  carvedilol (COREG) 3.125 MG tablet Take 1 tablet (3.125 mg total) by mouth 2 (two) times daily with a meal. 10/16/15  Yes Sital Mody, MD  colchicine 0.6 MG tablet Take 0.3-0.6 mg by mouth See admin instructions. Take 0.3mg  once daily to prevent gout, take 1.2mg  at first sign of gout flare, may follow in 1 hour with 0.6mg , max 3 tabs 11/11/11  Yes Historical Provider, MD  furosemide (LASIX) 80 MG tablet Take 1 tablet (80 mg total) by mouth 2 (two) times daily. 10/16/15  Yes Bettey Costa, MD  hydrALAZINE (APRESOLINE) 100 MG tablet Take 100 mg by mouth 2 (two) times daily.   Yes Historical Provider, MD  isosorbide mononitrate (IMDUR) 120 MG 24 hr tablet Take 240 mg by mouth daily.  05/30/15  Yes Historical Provider, MD  nitroGLYCERIN (NITROSTAT) 0.4 MG SL tablet Place 0.4 mg under the tongue every 5 (five) minutes as needed.  11/27/14  Yes Historical Provider, MD  potassium chloride SA (K-DUR,KLOR-CON) 20 MEQ tablet Take 20 mEq by mouth daily.   Yes Historical Provider, MD  ranolazine (RANEXA) 500 MG 12 hr tablet Take 1 tablet (500 mg  total) by mouth 2 (two) times daily. 10/16/15  Yes Bettey Costa, MD  tamsulosin (FLOMAX) 0.4 MG CAPS capsule Take 0.4 mg by mouth daily.  11/27/14  Yes Historical Provider, MD   Allergies  Allergen Reactions  . No Known Allergies Other (See Comments)   REVIEW OF SYSTEMS:   UNOBTAINABLE DUE TO CRITICAL ILLNESS   VITAL SIGNS: Temp:  [96.1 F (35.6 C)-98.4 F (36.9 C)] 96.1 F (35.6 C) (09/13 0733) Pulse Rate:  [62-128] 62 (09/13 0826) Resp:  [12-23] 22 (09/13 0826) BP: (75-162)/(49-142) 91/70 (09/13 0826) SpO2:  [86 %-100 %] 100 % (09/13 0826) FiO2 (%):  [24 %] 24 % (09/13 0826) Weight:  [171 lb 15.3 oz (78 kg)] 171 lb 15.3 oz (78 kg) (09/13 0500) HEMODYNAMICS:     Intake/Output Summary (Last 24 hours) at 11/07/15 0828 Last data filed at 11/07/15 0800  Gross per 24 hour  Intake          2868.16 ml  Output              137 ml  Net          2731.16 ml    PHYSICAL EXAMINATION: Physical Examination:   GENERAL:critically ill appearing, +resp distress HEAD: Normocephalic, atraumatic.  EYES: Pupils equal, round, reactive to light. Extraocular muscles intact. No scleral icterus.  MOUTH: Moist mucosal membrane. Dentition intact. No abscess noted.  EAR, NOSE, THROAT: Clear without  exudates. No external lesions.  NECK: Supple. No thyromegaly. No nodules. No JVD. c collar in place PULMONARY: Diffuse coarse rhonchi right sided +wheezes +rhonchi CARDIOVASCULAR: S1 and S2. Regular rate and rhythm. No murmurs, rubs, or gallops. No edema. GASTROINTESTINAL: Soft, nontender, +distended. No masses. Positive bowel sounds. No hepatosplenomegaly.  MUSCULOSKELETAL: No swelling, clubbing, or edema. Range of motion full in all extremities.  NEUROLOGIC: GCS<8T SKIN:+sacral ulceration,       LABS:  CBC  Recent Labs Lab 11/04/15 0520 11/05/15 0530 11/06/15 0613  WBC 6.7 6.6 6.9  HGB 8.3* 7.7* 7.8*  HCT 24.5* 22.9* 23.0*  PLT 105* 83* 77*   Coag's No results for input(s): APTT, INR in  the last 168 hours. BMET  Recent Labs Lab 11/06/15 2225 11/07/15 0212 11/07/15 0558  NA 138 138 137  K 4.3 4.3 4.1  CL 106 107 105  CO2 21* 23 23  BUN 13 14 13   CREATININE 0.83 0.85 0.83  GLUCOSE 135* 144* 140*   Electrolytes  Recent Labs Lab 11/05/15 0530  11/06/15 0613  11/06/15 2225 11/07/15 0212 11/07/15 0558  CALCIUM 8.1*  < > 8.3*  < > 8.4* 8.5* 8.4*  MG 1.7  --  1.8  --   --   --  1.7  PHOS 1.9*  < > 1.7*  1.8*  < > 2.7 2.3* 2.1*  < > = values in this interval not displayed. Sepsis Markers  Recent Labs Lab 11/07/2015 1415 11/02/15 0228 11/02/15 0543  LATICACIDVEN 1.7 4.5* 2.7*   ABG  Recent Labs Lab 11/02/15 0500 11/03/15 0500 11/05/15 0531  PHART 7.45 7.54* 7.47*  PCO2ART 32 23* 36  PO2ART 39* 166* 101   Liver Enzymes  Recent Labs Lab 11/02/15 0543 11/03/15 0147  11/06/15 1024  11/06/15 2225 11/07/15 0212 11/07/15 0558  AST 38 48*  --  73*  --   --   --   --   ALT 18 20  --  37  --   --   --   --   ALKPHOS 81 72  --  78  --   --   --   --   BILITOT 0.7 1.0  --  1.2  --   --   --   --   ALBUMIN 3.0* 2.8*  < > 2.7*  < > 2.6* 2.6* 2.6*  < > = values in this interval not displayed. Cardiac Enzymes  Recent Labs Lab 11/23/2015 1717 11/11/2015 2218 11/02/15 0543  TROPONINI 0.14* 0.13* 0.20*   Glucose  Recent Labs Lab 11/06/15 1229 11/06/15 1601 11/06/15 1956 11/06/15 2348 11/07/15 0402 11/07/15 0732  GLUCAP 134* 4* 105* 63* 19 146*   76 yo AAM with systolic CHF [id number], multiple medical issues admitted for sepsis from UTI with acidosis>> ICU due to to acute cardiac arrest now with ARF on CRRT.   Subjective: Patient continues to remain on vent, on CRRT, was hypothermic. No acute issues overnight.  PULMONARY-failed multiple SAT/SBT's A: Acute Respiratory Failure s/p PEA  arrest --Vent: PRVC/12/450/5/24% P: -Pt is stable on vent, however he continues on CRRT and pressor support. -Continue Bronchodilator Therapy -ABG and  CXR DAILY PRN .   CARDIOVASCULAR A: Cardiac aarest Cardiogenic shock Bradycardiac with HR in the 50s-HR improved to NSR in the 60s CHF P: -Continue low dose levophed to maintain MAP>65 or SBP>90 -Hemodynamic per ICU -PRN atropine if HR LE 30  RENAL A: Pt had CKD before admission, not with ARF.  Hypokalemia Hypophosphatemia P: -  Nephrology following; continue CRRT per nephro. -follow chem 7 -follow Is/Os -continue Foley Catheter -CRRT fluids adjusted for low K+ -Replace electrolytes per ICU protocol  GASTROINTESTINAL A: Increasing abd distension seen over past few days, review of imaging shows dilated bowel loops. This is likely c/w ileus from recent cardiac arrest.  P: Restarted trickle feeds last night Famotidine for GIP D10 while tube feeds are on hold. BM ON 11/06/15  HEMATOLOGIC Anemia of chronic disease P: Follow CBC Transfuse if Hg<7  INFECTIOUS A: Urosepsis --CXR suggestive of right lung pneumonia vs. Atelectasis.  P: -Empric abx with zosyn-almost completed -F/U cultures  ENDOCRINE A: Hypoglycemia P: -Monitor blood glucose  NEUROLOGIC Wean sedation and assess neuro status   I have personally obtained a history, examined the patient, evaluated Pertinent laboratory and RadioGraphic/imaging results, and  formulated the assessment and plan   The Patient requires high complexity decision making for assessment and support, frequent evaluation and titration of therapies, application of advanced monitoring technologies and extensive interpretation of multiple databases. Critical Care Time devoted to patient care services described in this note is 40 minutes.   Overall, patient is critically ill, prognosis is guarded.  Patient with Multiorgan failure and at high risk for cardiac arrest and death.   NOW DNR status,awaiting for family to arrive, recommend wean to extubate never to re-intubate, palliative care consulted   [name], M.D.   [facility] Pulmonary & Critical Care Medicine  Medical Director [place] Director [facility] Cardio-Pulmonary Department

## 2015-11-07 NOTE — Progress Notes (Signed)
Tatamy consultedfor piperacillin/tazobactam dosing, constipation and electrolyten management in this 75 yo AAM admitted for sepsis from UTI andacute cardiac arrest with multiorgan failure.  Pt currently on CRRT. Patient is to begin increased tube feeding today.  1. Piperacillin/tazobactam: Indication is for UTI and possible aspiration PNA. Patient currently on day 6 of piperacillin/tazobactam 3.375g IV q 8. Per rounds, will d/c after day 7 (9/14).  2. Constipation: Patient has been having large bowel movements. Increasing tube feeds today, will d/c erythromycin and see how pt does with tube feeds. Continued medications: sennosides 5 mL + docusate 100 mg BID Stopped medications: Erythromycin 250 mg q 8 hours  3. Electrolytes:  9/12 pm phos = 1.9 9/13 am phos = 2.1. Patient receiving sodium phos 15 mmol x 1. Renal function labs ordered q 6 hours. Magnesium ordered daily. Pharmacy will continue to monitor.   Allergies  Allergen Reactions  . No Known Allergies Other (See Comments)    Patient Measurements: Height: 5\' 8"  (172.7 cm) Weight: 171 lb 15.3 oz (78 kg) IBW/kg (Calculated) : 68.4   Vital Signs: Temp: 97.5 F (36.4 C) (09/13 1400) Temp Source: Axillary (09/13 1400) BP: 92/61 (09/13 1400) Pulse Rate: 65 (09/13 1400) Intake/Output from previous day: 09/12 0701 - 09/13 0700 In: 2868 [I.V.:1255; NG/GT:838; IV Piggyback:755] Out: 132 [Urine:32; Emesis/NG output:100] Intake/Output from this shift: Total I/O In: 917.9 [I.V.:167.9; NG/GT:295; IV Piggyback:455] Out: 113 [Urine:5; Other:108]  Labs:  Recent Labs  11/05/15 0530 11/06/15 0613  WBC 6.6 6.9  HGB 7.7* 7.8*  HCT 22.9* 23.0*  PLT 83* 77*     Recent Labs  11/05/15 0530  11/06/15 0613 11/06/15 1024  11/07/15 0212 11/07/15 0558 11/07/15 1248  NA 136  < > 137 138  < > 138 137 139  K 3.3*  < > 3.9 3.9  < > 4.3 4.1 3.9  CL 104  < > 106 106  < > 107 105 106  CO2 25  < > 27 25  < > 23 23  25   GLUCOSE 114*  < > 120* 135*  < > 144* 140* 163*  BUN 20  < > 12 12  < > 14 13 14   CREATININE 1.04  < > 0.90 0.84  < > 0.85 0.83 0.84  CALCIUM 8.1*  < > 8.3* 8.3*  < > 8.5* 8.4* 8.5*  MG 1.7  --  1.8  --   --   --  1.7  --   PHOS 1.9*  < > 1.7*  1.8* 2.0*  < > 2.3* 2.1* 2.0*  PROT  --   --   --  5.2*  --   --   --   --   ALBUMIN 2.5*  < > 2.7* 2.7*  < > 2.6* 2.6* 2.6*  AST  --   --   --  73*  --   --   --   --   ALT  --   --   --  37  --   --   --   --   ALKPHOS  --   --   --  78  --   --   --   --   BILITOT  --   --   --  1.2  --   --   --   --   < > = values in this interval not displayed. Estimated Creatinine Clearance: 73.5 mL/min (by C-G formula based on SCr of 0.84 mg/dL).  Recent Labs  11/07/15 0402 11/07/15 0732 11/07/15 1222  GLUCAP 77 146* 141*    Medical History: Past Medical History:  Diagnosis Date  . Anginal pain ([place])    had angina 2-3 mths prior to visit  . Arthritis   . CHF (congestive heart failure) ([place])   . Chronic kidney disease    follwed by Nephrologist Dr Juanito Doom in [place], Stage 3  . Coronary artery disease   . DDD (degenerative disc disease), cervical   . Diabetes mellitus without complication (HCC)    DIET CONTROLLED   . Dysphagia   . Frequent urination   . GERD (gastroesophageal reflux disease)   . Gout   . Hyperlipidemia   . Hypertension   . MI (myocardial infarction) ([place])   . Orthopnea   . Pancytopenia ([place])   . Shortness of breath dyspnea    at rest, mainly when he's active  . Stroke [facility])    ?? small stroke om 2017  came and went    Darrow Bussing, PharmD Pharmacy Resident 11/07/2015 2:59 PM

## 2015-11-07 NOTE — Progress Notes (Signed)
SUBJECTIVE:  Pt is still intubated, awake and makes brief eye contact but no meaningful communication. Nurse states that he has followed some commands to squeeze her hand.   Vitals:   11/07/15 0900 11/07/15 1000 11/07/15 1045 11/07/15 1100  BP: 124/81 (!) 90/55 (!) 92/58 (!) 87/61  Pulse: 64 65 62 63  Resp: 17 (!) 21 (!) 25 (!) 24  Temp:  97.3 F (36.3 C)  97.3 F (36.3 C)  TempSrc:  Oral  Axillary  SpO2: 100% 100% 100% 100%  Weight:      Height:        Intake/Output Summary (Last 24 hours) at 11/07/15 1209 Last data filed at 11/07/15 1100  Gross per 24 hour  Intake          3088.28 ml  Output              123 ml  Net          2965.28 ml    LABS: Basic Metabolic Panel:  Recent Labs  11/06/15 0613  11/07/15 0212 11/07/15 0558  NA 137  < > 138 137  K 3.9  < > 4.3 4.1  CL 106  < > 107 105  CO2 27  < > 23 23  GLUCOSE 120*  < > 144* 140*  BUN 12  < > 14 13  CREATININE 0.90  < > 0.85 0.83  CALCIUM 8.3*  < > 8.5* 8.4*  MG 1.8  --   --  1.7  PHOS 1.7*  1.8*  < > 2.3* 2.1*  < > = values in this interval not displayed. Liver Function Tests:  Recent Labs  11/06/15 1024  11/07/15 0212 11/07/15 0558  AST 73*  --   --   --   ALT 37  --   --   --   ALKPHOS 78  --   --   --   BILITOT 1.2  --   --   --   PROT 5.2*  --   --   --   ALBUMIN 2.7*  < > 2.6* 2.6*  < > = values in this interval not displayed. No results for input(s): LIPASE, AMYLASE in the last 72 hours. CBC:  Recent Labs  11/05/15 0530 11/06/15 0613  WBC 6.6 6.9  HGB 7.7* 7.8*  HCT 22.9* 23.0*  MCV 93.7 94.4  PLT 83* 77*   Cardiac Enzymes: No results for input(s): CKTOTAL, CKMB, CKMBINDEX, TROPONINI in the last 72 hours. BNP: Invalid input(s): POCBNP D-Dimer: No results for input(s): DDIMER in the last 72 hours. Hemoglobin A1C: No results for input(s): HGBA1C in the last 72 hours. Fasting Lipid Panel: No results for input(s): CHOL, HDL, LDLCALC, TRIG, CHOLHDL, LDLDIRECT in the last 72  hours. Thyroid Function Tests: No results for input(s): TSH, T4TOTAL, T3FREE, THYROIDAB in the last 72 hours.  Invalid input(s): FREET3 Anemia Panel: No results for input(s): VITAMINB12, FOLATE, FERRITIN, TIBC, IRON, RETICCTPCT in the last 72 hours.   PHYSICAL EXAM General: Ill appearing HEENT:  Normocephalic and atramatic Neck:  No JVD.  Lungs: Clear bilaterally to auscultation anteriorly, intubated Heart: HRRR . Normal S1 and S2 without gallops or murmurs.  Abdomen: Bowel sounds are positive, abdomen soft and non-tender, feeding tube in place Msk:  Very limited  Extremities: No clubbing, cyanosis or edema.   Neuro: partially sedated   TELEMETRY: Sinus rhythm in 60's-70's  ASSESSMENT AND PLAN: Status post cardiopulmonary arrest and now with multi system organ failure. He ahs cardiomyopathy  with EF 35%. Is intubated, on pressors to maintain adequate blood pressure, on CRRT, starting tube feeding. The patient's prognosis is guarded. He is a DNR and palliative care consult has been requested. Will continue to monitor and provide assistance as needed.  Principal Problem:   UTI (lower urinary tract infection) Active Problems:   Cardiac arrest ([place])   Acute respiratory failure (HCC)   Acute renal failure ([place])   Sepsis ([place])   Palliative care by specialist   Acute on chronic renal failure ([place])    Daune Perch, NP 11/07/2015 12:09 PM

## 2015-11-07 NOTE — Progress Notes (Signed)
[facility]  ROUNDING NOTE   Subjective:   Norepinephrine 8  Tolerating low flow tube foods  On CRRT 2.5 Litre, BFR 350. No UF   Spontaneous vent FiO24%  Objective:  Vital signs in last 24 hours:  Temp:  [96.1 F (35.6 C)-98.4 F (36.9 C)] 96.1 F (35.6 C) (09/13 0733) Pulse Rate:  [62-128] 64 (09/13 0900) Resp:  [12-23] 17 (09/13 0900) BP: (75-162)/(41-142) 89/41 (09/13 0845) SpO2:  [86 %-100 %] 100 % (09/13 0900) FiO2 (%):  [24 %] 24 % (09/13 0900) Weight:  [78 kg (171 lb 15.3 oz)] 78 kg (171 lb 15.3 oz) (09/13 0500)  Weight change: 1.8 kg (3 lb 15.5 oz) Filed Weights   11/05/15 0456 11/06/15 0500 11/07/15 0500  Weight: 75.1 kg (165 lb 9.1 oz) 76.2 kg (167 lb 15.9 oz) 78 kg (171 lb 15.3 oz)    Intake/Output: I/O last 3 completed shifts: In: 4310.9 [I.V.:2034.6; Other:20; NG/GT:948; IV Piggyback:1308.3] Out: 244 [Urine:44; Emesis/NG output:200]   Intake/Output this shift:  Total I/O In: 192.5 [I.V.:52.5; NG/GT:40; IV Piggyback:100] Out: 5 [Urine:5]  Physical Exam: General: Critically ill   Head: +ETT, +OG   Eyes: Anicteric, eyes open  Neck: No JVD, No LAD  Lungs:  Intubated, PS FiO24%  Heart: S1S2, regular  Abdomen:  +distended  Extremities: + edema.  Neurologic: Able to follow commands  Skin: No lesions  Access: Right femoral temp HD catheter 9/9 Dr. Isidore Moos    Basic Metabolic Panel:  Recent Labs Lab 11/03/15 1801  11/04/15 0520  11/05/15 0530  11/06/15 6606  11/06/15 1406 11/06/15 1803 11/06/15 2225 11/07/15 0212 11/07/15 0558  NA 138  < > 140  < > 136  < > 137  < > 138 137 138 138 137  K 3.7  < > 4.0  < > 3.3*  < > 3.9  < > 4.1 4.4 4.3 4.3 4.1  CL 107  < > 107  < > 104  < > 106  < > 106 106 106 107 105  CO2 24  < > 25  < > 25  < > 27  < > 27 25 21* 23 23  GLUCOSE 130*  < > 99  < > 114*  < > 120*  < > 145* 151* 135* 144* 140*  BUN 67*  < > 40*  < > 20  < > 12  < > 11 11 13 14 13   CREATININE 3.36*  < > 1.91*  < > 1.04  < >  0.90  < > 0.84 0.86 0.83 0.85 0.83  CALCIUM 8.1*  < > 8.3*  < > 8.1*  < > 8.3*  < > 8.3* 8.4* 8.4* 8.5* 8.4*  MG 2.0  --  1.7  --  1.7  --  1.8  --   --   --   --   --  1.7  PHOS 3.1  3.2  < > 2.1*  2.3*  < > 1.9*  < > 1.7*  1.8*  < > 1.9* 2.0* 2.7 2.3* 2.1*  < > = values in this interval not displayed.  Liver Function Tests:  Recent Labs Lab 11/02/15 0543 11/03/15 0147  11/06/15 1024 11/06/15 1406 11/06/15 1803 11/06/15 2225 11/07/15 0212 11/07/15 0558  AST 38 48*  --  73*  --   --   --   --   --   ALT 18 20  --  37  --   --   --   --   --  ALKPHOS 81 72  --  78  --   --   --   --   --   BILITOT 0.7 1.0  --  1.2  --   --   --   --   --   PROT 5.3* 5.2*  --  5.2*  --   --   --   --   --   ALBUMIN 3.0* 2.8*  < > 2.7* 2.5* 2.6* 2.6* 2.6* 2.6*  < > = values in this interval not displayed. No results for input(s): LIPASE, AMYLASE in the last 168 hours. No results for input(s): AMMONIA in the last 168 hours.  CBC:  Recent Labs Lab 11/02/15 0543 11/03/15 0147 11/04/15 0520 11/05/15 0530 11/06/15 0613  WBC 4.1 4.4 6.7 6.6 6.9  HGB 8.0* 7.9* 8.3* 7.7* 7.8*  HCT 23.6* 23.2* 24.5* 22.9* 23.0*  MCV 94.4 92.6 93.1 93.7 94.4  PLT 74* 80* 105* 83* 77*    Cardiac Enzymes:  Recent Labs Lab 11/09/2015 1011 11/13/2015 1717 11/07/2015 2218 11/02/15 0543  TROPONINI 0.16* 0.14* 0.13* 0.20*    BNP: Invalid input(s): POCBNP  CBG:  Recent Labs Lab 11/06/15 1601 11/06/15 1956 11/06/15 2348 11/07/15 0402 11/07/15 0732  GLUCAP 130* 136* 136* 2 146*    Microbiology: Results for orders placed or performed during the hospital encounter of 11/05/2015  Urine culture     Status: Abnormal   Collection Time: 11/22/2015 10:11 AM  Result Value Ref Range Status   Specimen Description URINE, RANDOM  Final   Special Requests NONE  Final   Culture >=100,000 COLONIES/mL KLEBSIELLA PNEUMONIAE (A)  Final   Report Status 11/03/2015 FINAL  Final   Organism ID, Bacteria KLEBSIELLA  PNEUMONIAE (A)  Final      Susceptibility   Klebsiella pneumoniae - MIC*    AMPICILLIN >=32 RESISTANT Resistant     CEFAZOLIN <=4 SENSITIVE Sensitive     CEFTRIAXONE <=1 SENSITIVE Sensitive     CIPROFLOXACIN <=0.25 SENSITIVE Sensitive     GENTAMICIN <=1 SENSITIVE Sensitive     IMIPENEM <=0.25 SENSITIVE Sensitive     NITROFURANTOIN <=16 SENSITIVE Sensitive     TRIMETH/SULFA <=20 SENSITIVE Sensitive     AMPICILLIN/SULBACTAM 4 SENSITIVE Sensitive     PIP/TAZO <=4 SENSITIVE Sensitive     Extended ESBL NEGATIVE Sensitive     * >=100,000 COLONIES/mL KLEBSIELLA PNEUMONIAE  Culture, blood (routine x 2)     Status: None   Collection Time: 11/16/2015 11:13 AM  Result Value Ref Range Status   Specimen Description BLOOD RIGHT AC  Final   Special Requests   Final    BOTTLES DRAWN AEROBIC AND ANAEROBIC ANA 8ML AER 10ML   Culture NO GROWTH 5 DAYS  Final   Report Status 11/06/2015 FINAL  Final  Culture, blood (routine x 2)     Status: None   Collection Time: 11/11/2015 11:13 AM  Result Value Ref Range Status   Specimen Description BLOOD LEFT ARM  Final   Special Requests BOTTLES DRAWN AEROBIC AND ANAEROBIC [place]  Final   Culture NO GROWTH 5 DAYS  Final   Report Status 11/06/2015 FINAL  Final  Culture, blood (routine x 2)     Status: None   Collection Time: 11/02/15  2:17 AM  Result Value Ref Range Status   Specimen Description BLOOD RIGHT HAND  Final   Special Requests BOTTLES DRAWN AEROBIC AND ANAEROBIC 5CC  Final   Culture NO GROWTH 5 DAYS  Final   Report Status 11/07/2015  FINAL  Final  Culture, blood (routine x 2)     Status: None   Collection Time: 11/02/15  2:28 AM  Result Value Ref Range Status   Specimen Description BLOOD LEFT HAND  Final   Special Requests BOTTLES DRAWN AEROBIC AND ANAEROBIC [place]  Final   Culture NO GROWTH 5 DAYS  Final   Report Status 11/07/2015 FINAL  Final  MRSA PCR Screening     Status: None   Collection Time: 11/02/15 10:42 AM  Result Value Ref Range Status    MRSA by PCR NEGATIVE NEGATIVE Final    Comment:        The GeneXpert MRSA Assay (FDA approved for NASAL specimens only), is one component of a comprehensive MRSA colonization surveillance program. It is not intended to diagnose MRSA infection nor to guide or monitor treatment for MRSA infections.     Coagulation Studies: No results for input(s): LABPROT, INR in the last 72 hours.  Urinalysis: No results for input(s): COLORURINE, LABSPEC, PHURINE, GLUCOSEU, HGBUR, BILIRUBINUR, KETONESUR, PROTEINUR, UROBILINOGEN, NITRITE, LEUKOCYTESUR in the last 72 hours.  Invalid input(s): APPERANCEUR    Imaging: No results found.   Medications:   . dextrose 35 mL/hr at 11/07/15 0700  . fentaNYL infusion INTRAVENOUS Stopped (11/07/15 0733)  . norepinephrine (LEVOPHED) Adult infusion 6 mcg/min (11/07/15 0900)  . pureflow 3 each (11/06/15 2300)   . aspirin  81 mg Oral Daily  . chlorhexidine  15 mL Mouth Rinse BID  . sennosides  5 mL Oral BID   And  . docusate  100 mg Oral BID  . erythromycin  250 mg Intravenous Q8H  . famotidine (PEPCID) IV  20 mg Intravenous Q24H  . feeding supplement (PRO-STAT SUGAR FREE 64)  60 mL Per Tube TID  . feeding supplement (VITAL HIGH PROTEIN)  1,000 mL Per Tube Q24H  . fentaNYL  50 mcg Intravenous Once  . heparin subcutaneous  5,000 Units Subcutaneous Q8H  . hydrocortisone sod succinate (SOLU-CORTEF) inj  50 mg Intravenous Q6H  . magnesium sulfate 1 - 4 g bolus IVPB  1 g Intravenous Once  . mouth rinse  15 mL Mouth Rinse Q2H  . piperacillin-tazobactam (ZOSYN)  IV  3.375 g Intravenous Q8H  . sodium chloride flush  10-40 mL Intracatheter Q12H  . sodium chloride flush  3 mL Intravenous Q12H  . sodium phosphate  Dextrose 5% IVPB  15 mmol Intravenous Once   sodium chloride, sodium chloride, acetaminophen, fentaNYL, heparin, midazolam, ondansetron (ZOFRAN) IV, sodium chloride flush, sodium chloride flush  Assessment/ Plan:  Robert Parks is a 75 y.o. black  male with coronary artery disease, systolic congestive heart failure, diabetes mellitus type II, gout, hypertension, hyperlipidemia, CVA. Admitted on 11/17/2015 for  Urinary tract infection, sepsis, acute respiratory failure and acute renal failure.   1. Acute renal failure on chronic kidney disease stage IV baseline creatinine 2.3 EGFR 29 followed by [facility] nephrology. Anuric requiring CRRT. Acute renal failure from sepsis, urinary tract infection, hypotension and shock. Most likely ATN with poor chance of recovery.  - Continue CRRT while hemodynamically unstable. 4K bath. Start UF 25/hr - Replace electrolytes  2. Acute respiratory failure: on mechanical ventilation. Pressure support FiO24% - appreciate pulmonary input  3. Anemia of chronic kidney disease: hemoglobin 7.8 - not currently on epo - daily CBC  4. Sepsis with urinary tract infection: E. Coli in urine culture - pip/tazo  Overall prognosis poor. Now DNR. Family meeting at 3pm   LOS: [place], [place] 9/13/20179:45 AM

## 2015-11-08 ENCOUNTER — Inpatient Hospital Stay: Payer: Medicare HMO | Attending: Oncology

## 2015-11-08 DIAGNOSIS — N189 Chronic kidney disease, unspecified: Secondary | ICD-10-CM

## 2015-11-08 DIAGNOSIS — Z978 Presence of other specified devices: Secondary | ICD-10-CM

## 2015-11-08 LAB — RENAL FUNCTION PANEL
ALBUMIN: 2.5 g/dL — AB (ref 3.5–5.0)
ALBUMIN: 2.5 g/dL — AB (ref 3.5–5.0)
Anion gap: 5 (ref 5–15)
Anion gap: 6 (ref 5–15)
BUN: 15 mg/dL (ref 6–20)
BUN: 16 mg/dL (ref 6–20)
CO2: 26 mmol/L (ref 22–32)
CO2: 27 mmol/L (ref 22–32)
CREATININE: 0.77 mg/dL (ref 0.61–1.24)
Calcium: 8.4 mg/dL — ABNORMAL LOW (ref 8.9–10.3)
Calcium: 8.4 mg/dL — ABNORMAL LOW (ref 8.9–10.3)
Chloride: 106 mmol/L (ref 101–111)
Chloride: 106 mmol/L (ref 101–111)
Creatinine, Ser: 0.8 mg/dL (ref 0.61–1.24)
GFR calc Af Amer: >60 mL/min (ref >60)
GFR calc Af Amer: >60 mL/min (ref >60)
GFR calc non Af Amer: >60 mL/min (ref >60)
GLUCOSE: 166 mg/dL — AB (ref 65–99)
GLUCOSE: 180 mg/dL — AB (ref 65–99)
PHOSPHORUS: 1.3 mg/dL — AB (ref 2.5–4.6)
PHOSPHORUS: 1.4 mg/dL — AB (ref 2.5–4.6)
POTASSIUM: 3.4 mmol/L — AB (ref 3.5–5.1)
Potassium: 3.6 mmol/L (ref 3.5–5.1)
SODIUM: 138 mmol/L (ref 135–145)
Sodium: 138 mmol/L (ref 135–145)

## 2015-11-08 LAB — GLUCOSE, CAPILLARY
GLUCOSE-CAPILLARY: 174 mg/dL — AB (ref 65–99)
GLUCOSE-CAPILLARY: 172 mg/dL — AB (ref 65–99)
Glucose-Capillary: 144 mg/dL — ABNORMAL HIGH (ref 65–99)
Glucose-Capillary: 148 mg/dL — ABNORMAL HIGH (ref 65–99)
Glucose-Capillary: 178 mg/dL — ABNORMAL HIGH (ref 65–99)
Glucose-Capillary: 187 mg/dL — ABNORMAL HIGH (ref 65–99)

## 2015-11-08 LAB — CBC
HEMATOCRIT: 22.3 % — AB (ref 40.0–52.0)
HEMOGLOBIN: 7.5 g/dL — AB (ref 13.0–18.0)
MCH: 31.9 pg (ref 26.0–34.0)
MCHC: 33.7 g/dL (ref 32.0–36.0)
MCV: 94.8 fL (ref 80.0–100.0)
Platelets: 57 10*3/uL — ABNORMAL LOW (ref 150–440)
RBC: 2.36 MIL/uL — ABNORMAL LOW (ref 4.40–5.90)
RDW: 15.5 % — AB (ref 11.5–14.5)
WBC: 6.4 10*3/uL (ref 3.8–10.6)

## 2015-11-08 LAB — PHOSPHORUS: Phosphorus: 1.5 mg/dL — ABNORMAL LOW (ref 2.5–4.6)

## 2015-11-08 LAB — MAGNESIUM: MAGNESIUM: 1.8 mg/dL (ref 1.7–2.4)

## 2015-11-08 MED ORDER — ORAL CARE MOUTH RINSE: 15.0000 mL | Freq: Two times a day (BID) | OROMUCOSAL | Status: DC

## 2015-11-08 MED ORDER — CHLORHEXIDINE GLUCONATE 0.12 % MT SOLN
15.0000 mL | Freq: Two times a day (BID) | OROMUCOSAL | Status: DC
  Administered 2015-11-08 – 2015-11-09 (×3): 15 mL via OROMUCOSAL
  Filled 2015-11-08 (×2): qty 15

## 2015-11-08 MED ORDER — SODIUM PHOSPHATES 45 MMOLE/15ML IV SOLN
15.0000 mmol | Freq: Once | INTRAVENOUS | Status: AC
  Administered 2015-11-08: 15 mmol via INTRAVENOUS
  Filled 2015-11-08: qty 5

## 2015-11-08 MED ORDER — HEPARIN SODIUM (PORCINE) 1000 UNIT/ML DIALYSIS
1000.0000 [IU] | INTRAMUSCULAR | Status: DC | PRN
  Administered 2015-11-08: 3600 [IU] via INTRAVENOUS_CENTRAL
  Filled 2015-11-08: qty 10
  Filled 2015-11-08: qty 6

## 2015-11-08 MED ORDER — DEXTROSE 5 % IV SOLN
30.0000 mmol | Freq: Once | INTRAVENOUS | Status: AC
  Administered 2015-11-08: 30 mmol via INTRAVENOUS
  Filled 2015-11-08: qty 10

## 2015-11-08 NOTE — Progress Notes (Signed)
Signout received from pulmonary critical care Dr. Juanell Fairly  Patient admitted with cardiac arrest which is thought to be likely due to cardiomyopathy. Patient is extubated and presently DO NOT RESUSCITATE. He is getting CRRT and may end up on permanent hemodialysis. Family considering options at this time. Continue inpatient care. Also has associated UTI and anemia of chronic disease

## 2015-11-08 NOTE — Progress Notes (Addendum)
Patient extubated at 1550, bilateral lung sounds rhonchus. Patient on 5L currently with saturations of 99%. Will continue to assess, family updated. Wilnette Kales

## 2015-11-08 NOTE — Progress Notes (Signed)
Per verbal order from Dr. Juleen China d/c CRRT at this time. Patient disconnected and both lumens of dialysis catheter flushed with saline and filled with heparin. Family updated on plan of care, no questions at this time. Patient will open eyes and follow simple commands, but still drowsy. No sedation currently infusing. Patient has been off tube feeds for possible extubation later in day, tolerating spontaneous on vent well so far. Will continue to assess. Robert Parks

## 2015-11-08 NOTE — Progress Notes (Addendum)
Venous pressure and Effluent pressure suddenly started rising around midnight; both lines flushed with blood return noted.  Despite NS flushes, VP and EP continue to be in the 300s.  Rinseback performed and CRRT stopped temporarily at 0027.  Cartridge changed and CRRT restarted at 0133.  VP and EP continue to remain elevated in low 300s.  Both lines flushed again with blood return noted. CRRT running with no issues at this time.  Will continue to monitor closely.  [place], NP notified of pt's phosphorus level 1.3 at midnight. New orders received.

## 2015-11-08 NOTE — Progress Notes (Signed)
Patient currently stable, tolerating 5L of oxygen, saturation 100%. Dr. Ashby Dawes notified and gave orders to transfer patient to med surg. VSS. Patient no longer requires levophed to maintain BP. Robert Parks

## 2015-11-08 NOTE — Progress Notes (Signed)
Pt extubated and doing well on [place], awake, alert and somewhat responsive. Follows commands, but is not yet verbal.   Will transfer to [facility], discussed with hospitalist service, palliative care. Pt now DNR, there are ongoing discussions regarding goals of care.   Marda Stalker, M.D. 11/08/2015

## 2015-11-08 NOTE — Progress Notes (Signed)
[place] consultedfor piperacillin/tazobactam dosing, constipation and electrolyten management in this 75 yo AAM admitted for sepsis from UTI andacute cardiac arrest with multiorgan failure.  Pt currently on CRRT. Patient is to begin increased tube feeding today.  1. Piperacillin/tazobactam: Indication is for UTI and possible aspiration PNA. Patient currently on day 7 of piperacillin/tazobactam 3.375g IV q 8. Per rounds, will d/c after day 7 (9/14).  2. Constipation: Patient with multiple loose bowel movements. Will d/c senna/docusate.   3. Electrolytes:  Sodium phosphate ordered this AM. Will recheck phos as level very low and patient was on CRRT. As CRRT stopped and patient is on tube feeds, will not replace potassium but will f/u AM labs.    Allergies  Allergen Reactions  . No Known Allergies Other (See Comments)    Patient Measurements: Height: 5\' 8"  (172.7 cm) Weight: 177 lb 4 oz (80.4 kg) IBW/kg (Calculated) : 68.4   Vital Signs: Temp: 97.2 F (36.2 C) (09/14 1115) Temp Source: Oral (09/14 1115) BP: 105/70 (09/14 1200) Pulse Rate: 80 (09/14 1200) Intake/Output from previous day: 09/13 0701 - 09/14 0700 In: 2343.8 [I.V.:346.1; NG/GT:1187.7; IV Piggyback:810] Out: 492 [Urine:7] Intake/Output from this shift: Total I/O In: 251.7 [I.V.:53.3; NG/GT:148.4; IV Piggyback:50] Out: 121 [Urine:25; Other:96]  Labs:  Recent Labs  11/06/15 0613 11/08/15 0559  WBC 6.9 6.4  HGB 7.8* 7.5*  HCT 23.0* 22.3*  PLT 77* 57*     Recent Labs  11/06/15 0613 11/06/15 1024  11/07/15 0558 11/07/15 1248 11/07/15 1709 11/08/15 0009 11/08/15 0559  NA 137 138  < > 137 139 138 138 138  K 3.9 3.9  < > 4.1 3.9 3.9 3.6 3.4*  CL 106 106  < > 105 106 106 106 106  CO2 27 25  < > 23 25 24 26 27   GLUCOSE 120* 135*  < > 140* 163* 158* 166* 180*  BUN 12 12  < > 13 14 15 15 16   CREATININE 0.90 0.84  < > 0.83 0.84 0.84 0.77 0.80  CALCIUM 8.3* 8.3*  < > 8.4* 8.5* 8.3* 8.4*  8.4*  MG 1.8  --   --  1.7  --   --   --  1.8  PHOS 1.7*  1.8* 2.0*  < > 2.1* 2.0* 2.4* 1.3* 1.4*  PROT  --  5.2*  --   --   --   --   --   --   ALBUMIN 2.7* 2.7*  < > 2.6* 2.6* 2.6* 2.5* 2.5*  AST  --  73*  --   --   --   --   --   --   ALT  --  37  --   --   --   --   --   --   ALKPHOS  --  78  --   --   --   --   --   --   BILITOT  --  1.2  --   --   --   --   --   --   TRIG  --   --   --   --  91  --   --   --   < > = values in this interval not displayed. Estimated Creatinine Clearance: 77.2 mL/min (by C-G formula based on SCr of 0.8 mg/dL).    Recent Labs  11/08/15 0411 11/08/15 0737 11/08/15 1151  GLUCAP 148* 174* 187*    Medical History: Past Medical  History:  Diagnosis Date  . Anginal pain ([place])    had angina 2-3 mths prior to visit  . Arthritis   . CHF (congestive heart failure) ([place])   . Chronic kidney disease    follwed by Nephrologist Dr Juanito Doom in [place], Stage 3  . Coronary artery disease   . DDD (degenerative disc disease), cervical   . Diabetes mellitus without complication (HCC)    DIET CONTROLLED   . Dysphagia   . Frequent urination   . GERD (gastroesophageal reflux disease)   . Gout   . Hyperlipidemia   . Hypertension   . MI (myocardial infarction) ([place])   . Orthopnea   . Pancytopenia ([place])   . Shortness of breath dyspnea    at rest, mainly when he's active  . Stroke [facility])    ?? small stroke om 2017  came and went    Ulice Dash, PharmD Clinical Pharmacist  11/08/2015 12:36 PM

## 2015-11-08 NOTE — Progress Notes (Signed)
SUBJECTIVE: Patient is responding appropriately   Vitals:   11/08/15 0645 11/08/15 0700 11/08/15 0800 11/08/15 0900  BP: 107/68 104/67 104/71 122/84  Pulse: 69 84 64 73  Resp: 18 19 17 20   Temp:   97.3 F (36.3 C)   TempSrc:   Axillary   SpO2: 100% 100% 99% 100%  Weight:      Height:        Intake/Output Summary (Last 24 hours) at 11/08/15 0926 Last data filed at 11/08/15 0800  Gross per 24 hour  Intake          2151.33 ml  Output              511 ml  Net          1640.33 ml    LABS: Basic Metabolic Panel:  Recent Labs  11/07/15 0558  11/07/15 1709 11/08/15 0009 11/08/15 0559  NA 137  < > 138 138  --   K 4.1  < > 3.9 3.6  --   CL 105  < > 106 106  --   CO2 23  < > 24 26  --   GLUCOSE 140*  < > 158* 166*  --   BUN 13  < > 15 15  --   CREATININE 0.83  < > 0.84 0.77  --   CALCIUM 8.4*  < > 8.3* 8.4*  --   MG 1.7  --   --   --  1.8  PHOS 2.1*  < > 2.4* 1.3*  --   < > = values in this interval not displayed. Liver Function Tests:  Recent Labs  11/06/15 1024  11/07/15 1709 11/08/15 0009  AST 73*  --   --   --   ALT 37  --   --   --   ALKPHOS 78  --   --   --   BILITOT 1.2  --   --   --   PROT 5.2*  --   --   --   ALBUMIN 2.7*  < > 2.6* 2.5*  < > = values in this interval not displayed. No results for input(s): LIPASE, AMYLASE in the last 72 hours. CBC:  Recent Labs  11/06/15 0613 11/08/15 0559  WBC 6.9 6.4  HGB 7.8* 7.5*  HCT 23.0* 22.3*  MCV 94.4 94.8  PLT 77* 57*   Cardiac Enzymes: No results for input(s): CKTOTAL, CKMB, CKMBINDEX, TROPONINI in the last 72 hours. BNP: Invalid input(s): POCBNP D-Dimer: No results for input(s): DDIMER in the last 72 hours. Hemoglobin A1C: No results for input(s): HGBA1C in the last 72 hours. Fasting Lipid Panel:  Recent Labs  11/07/15 1248  TRIG 91   Thyroid Function Tests: No results for input(s): TSH, T4TOTAL, T3FREE, THYROIDAB in the last 72 hours.  Invalid input(s): FREET3 Anemia Panel: No  results for input(s): VITAMINB12, FOLATE, FERRITIN, TIBC, IRON, RETICCTPCT in the last 72 hours.   PHYSICAL EXAM General: Well developed, well nourished, in no acute distress HEENT:  Normocephalic and atramatic Neck:  No JVD.  Lungs: Clear bilaterally to auscultation and percussion. Heart: HRRR . Normal S1 and S2 without gallops or murmurs.  Abdomen: Bowel sounds are positive, abdomen soft and non-tender  Msk:  Back normal, normal gait. Normal strength and tone for age. Extremities: No clubbing, cyanosis or edema.   Neuro: Alert and oriented X 3. 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 Patient is going to be extubated today. The family does not want reintubation if he needs it after extubation.  Principal Problem:   UTI (lower urinary tract infection) Active Problems:   Cardiac arrest (HCC)   Acute respiratory failure with hypoxia (HCC)   Acute renal failure (HCC)   Sepsis ([place])   Palliative care by specialist   Acute on chronic renal failure [facility])   Endotracheally intubated    Dionisio David, MD, [facility] 11/08/2015 9:26 AM

## 2015-11-08 NOTE — Progress Notes (Signed)
PULMONARY / CRITICAL CARE MEDICINE   Name: Robert Parks MRN:   [id number] DOB:   [birth year]           ADMISSION DATE:  11/11/2015   CHIEF COMPLAINT:  Acute cardiac arrest  SIGNIFICANT EVENTS: S/p cardiac arrest Asystole/PEA   HISTORY OF PRESENT ILLNESS:  Remains intubated,sedated On full vent support   9/8 unresponsive CODE BLUE CALLED patient with PEA and asystole for approx 10 mins, sacral decub ulcer,  9/10 started CRRT 9/11 failed SAT/SBT 9/12 failed SAT/SBT  9/14-will plan for extubation with plan NOT to intubate  Remains intubated,sedated, awaiting for family to arrive      REVIEW OF SYSTEMS:   UNOBTAINABLE DUE TO CRITICAL ILLNESS   PHYSICAL EXAMINATION: Physical Examination:   GENERAL:critically ill appearing, +resp distress HEAD: Normocephalic, atraumatic.  EYES: Pupils equal, round, reactive to light. Extraocular muscles intact. No scleral icterus.  MOUTH: Moist mucosal membrane. Dentition intact. No abscess noted.  EAR, NOSE, THROAT: Clear without exudates. No external lesions.  NECK: Supple. No thyromegaly. No nodules. No JVD. c collar in place PULMONARY: Diffuse coarse rhonchi right sided +wheezes +rhonchi CARDIOVASCULAR: S1 and S2. Regular rate and rhythm. No murmurs, rubs, or gallops. No edema. GASTROINTESTINAL: Soft, nontender, +distended. No masses. Positive bowel sounds. No hepatosplenomegaly.  MUSCULOSKELETAL: No swelling, clubbing, or edema. Range of motion full in all extremities.  NEUROLOGIC: GCS<8T SKIN:+sacral ulceration,    BP 104/71   Pulse 64   Temp 97.3 F (36.3 C) (Axillary)   Resp 17   Ht 5\' 8"  (1.727 m)   Wt 177 lb 4 oz (80.4 kg)   SpO2 99%   BMI 26.95 kg/m     75 yo AAM with systolic CHF [id number], multiple medical issues admitted for sepsis from UTI with acidosis>>ICU due to to acute cardiac arrest now with ARF on CRRT.    PULMONARY-failed multiple SAT/SBT's A: Acute Respiratory Failure s/p  PEAarrest --Vent: PRVC/12/450/5/24% P: -Pt is stable on vent, however he continues on CRRT and pressor support. -Continue Bronchodilator Therapy -ABG and CXR DAILY PRN .   Plan for extubation today when family arrives, wean sedation  CARDIOVASCULAR A: Cardiac aarest Cardiogenic shock Bradycardiac with HR in the 50s-HR improved to NSR in the 60s CHF P: -Continue low dose levophed to maintain MAP>65 or SBP>90 -Hemodynamic per ICU -PRN atropine if HR LE 30  RENAL A: Pt had CKD before admission, not with ARF.  Hypokalemia Hypophosphatemia P: -Nephrology following; continue CRRT per nephro. -follow chem 7 -follow Is/Os -continue Foley Catheter -CRRT fluids adjusted for low K+ -Replace electrolytes per ICU protocol  GASTROINTESTINAL A: Increasing abd distension seen over past few days, review of imaging shows dilated bowel loops. This is likely c/w ileus from recent cardiac arrest.  P: Restarted trickle feeds last night Famotidine for GIP D10 while tube feeds are on hold. BM ON 11/06/15  HEMATOLOGIC Anemia of chronic disease P: Follow CBC Transfuse if Hg<7  INFECTIOUS A: Urosepsis --CXR suggestive of right lung pneumonia vs. Atelectasis.  P: -Empric abx with zosyn-almost completed -F/U cultures  ENDOCRINE A: Hypoglycemia P: -Monitor blood glucose  NEUROLOGIC Wean sedation and assess neuro status   I have personally obtained a history, examined the patient, evaluated Pertinent laboratory and [employer]/imaging results, and  formulated the assessment and plan   The Patient requires high complexity decision making for assessment and support, frequent evaluation and titration of therapies, application of advanced monitoring technologies and extensive interpretation of multiple databases. Critical Care Time devoted to patient  care services described in this note is 40 minutes.   Overall, patient is critically ill, prognosis is guarded.   Patient with Multiorgan failure and at high risk for cardiac arrest and death.   NOW DNR status,awaiting for family to arrive, recommend wean to extubate never to re-intubate and family agrees with this plan   [name], M.D.  [facility]  Medical Director [place] Director [facility] Cardio-Pulmonary Department

## 2015-11-08 NOTE — Progress Notes (Signed)
Extubated without complications to 6lnc

## 2015-11-08 NOTE — Progress Notes (Signed)
[facility]  ROUNDING NOTE   Subjective:   Norepinephrine 6  Family at bedside.   CRRT with no issues  Objective:  Vital signs in last 24 hours:  Temp:  [96.3 F (35.7 C)-98.1 F (36.7 C)] 96.7 F (35.9 C) (09/14 1015) Pulse Rate:  [50-84] 84 (09/14 1015) Resp:  [12-28] 21 (09/14 1015) BP: (69-142)/(43-115) 102/73 (09/14 1015) SpO2:  [96 %-100 %] 100 % (09/14 1015) FiO2 (%):  [24 %] 24 % (09/14 0809) Weight:  [80.4 kg (177 lb 4 oz)] 80.4 kg (177 lb 4 oz) (09/14 0500)  Weight change: 2.4 kg (5 lb 4.7 oz) Filed Weights   11/06/15 0500 11/07/15 0500 11/08/15 0500  Weight: 76.2 kg (167 lb 15.9 oz) 78 kg (171 lb 15.3 oz) 80.4 kg (177 lb 4 oz)    Intake/Output: I/O last 3 completed shifts: In: 3673.2 [I.V.:935.6; NG/GT:1627.7; IV Piggyback:1110] Out: 492 [Urine:7; Other:485]   Intake/Output this shift:  Total I/O In: 191.6 [I.V.:43.2; NG/GT:148.4] Out: 48 [Other:48]  Physical Exam: General: Critically ill   Head: +ETT, +OG   Eyes: Anicteric, eyes open  Neck: No JVD, No LAD  Lungs:  Intubated, PS FiO24%  Heart: S1S2, regular  Abdomen:  +distended  Extremities: + edema.  Neurologic: Able to follow commands  Skin: No lesions  Access: Right femoral temp HD catheter 9/9 Dr. Isidore Moos    Basic Metabolic Panel:  Recent Labs Lab 11/04/15 0520  11/05/15 0530  11/06/15 0881  11/07/15 1031 11/07/15 0558 11/07/15 1248 11/07/15 1709 11/08/15 0009 11/08/15 0559  NA 140  < > 136  < > 137  < > 138 137 139 138 138  --   K 4.0  < > 3.3*  < > 3.9  < > 4.3 4.1 3.9 3.9 3.6  --   CL 107  < > 104  < > 106  < > 107 105 106 106 106  --   CO2 25  < > 25  < > 27  < > _0 --   GLUCOSE 99  < > 114*  < > 120*  < > 144* 140* 163* 158* 166*  --   BUN 40*  < > 20  < > 12  < > _1 --   CREATININE 1.91*  < > 1.04  < > 0.90  < > 0.85 0.83 0.84 0.84 0.77  --   CALCIUM 8.3*  < > 8.1*  < > 8.3*  < > 8.5* 8.4* 8.5* 8.3* 8.4*  --   MG 1.7  --  1.7   --  1.8  --   --  1.7  --   --   --  1.8  PHOS 2.1*  2.3*  < > 1.9*  < > 1.7*  1.8*  < > 2.3* 2.1* 2.0* 2.4* 1.3*  --   < > = values in this interval not displayed.  Liver Function Tests:  Recent Labs Lab 11/02/15 0543 11/03/15 0147  11/06/15 1024  11/07/15 0212 11/07/15 0558 11/07/15 1248 11/07/15 1709 11/08/15 0009  AST 38 48*  --  73*  --   --   --   --   --   --   ALT 18 20  --  37  --   --   --   --   --   --   ALKPHOS 81 72  --  78  --   --   --   --   --   --  BILITOT 0.7 1.0  --  1.2  --   --   --   --   --   --   PROT 5.3* 5.2*  --  5.2*  --   --   --   --   --   --   ALBUMIN 3.0* 2.8*  < > 2.7*  < > 2.6* 2.6* 2.6* 2.6* 2.5*  < > = values in this interval not displayed. No results for input(s): LIPASE, AMYLASE in the last 168 hours. No results for input(s): AMMONIA in the last 168 hours.  CBC:  Recent Labs Lab 11/03/15 0147 11/04/15 0520 11/05/15 0530 11/06/15 0613 11/08/15 0559  WBC 4.4 6.7 6.6 6.9 6.4  HGB 7.9* 8.3* 7.7* 7.8* 7.5*  HCT 23.2* 24.5* 22.9* 23.0* 22.3*  MCV 92.6 93.1 93.7 94.4 94.8  PLT 80* 105* 83* 77* 57*    Cardiac Enzymes:  Recent Labs Lab 11/05/2015 1717 11/07/2015 2218 11/02/15 0543  TROPONINI 0.14* 0.13* 0.20*    BNP: Invalid input(s): POCBNP  CBG:  Recent Labs Lab 11/07/15 1554 11/07/15 2014 11/08/15 0013 11/08/15 0411 11/08/15 0737  GLUCAP 157* 136* 144* 148* 174*    Microbiology: Results for orders placed or performed during the hospital encounter of 11/04/2015  Urine culture     Status: Abnormal   Collection Time: 11/18/2015 10:11 AM  Result Value Ref Range Status   Specimen Description URINE, RANDOM  Final   Special Requests NONE  Final   Culture >=100,000 COLONIES/mL KLEBSIELLA PNEUMONIAE (A)  Final   Report Status 11/03/2015 FINAL  Final   Organism ID, Bacteria KLEBSIELLA PNEUMONIAE (A)  Final      Susceptibility   Klebsiella pneumoniae - MIC*    AMPICILLIN >=32 RESISTANT Resistant     CEFAZOLIN <=4  SENSITIVE Sensitive     CEFTRIAXONE <=1 SENSITIVE Sensitive     CIPROFLOXACIN <=0.25 SENSITIVE Sensitive     GENTAMICIN <=1 SENSITIVE Sensitive     IMIPENEM <=0.25 SENSITIVE Sensitive     NITROFURANTOIN <=16 SENSITIVE Sensitive     TRIMETH/SULFA <=20 SENSITIVE Sensitive     AMPICILLIN/SULBACTAM 4 SENSITIVE Sensitive     PIP/TAZO <=4 SENSITIVE Sensitive     Extended ESBL NEGATIVE Sensitive     * >=100,000 COLONIES/mL KLEBSIELLA PNEUMONIAE  Culture, blood (routine x 2)     Status: None   Collection Time: 11/22/2015 11:13 AM  Result Value Ref Range Status   Specimen Description BLOOD RIGHT AC  Final   Special Requests   Final    BOTTLES DRAWN AEROBIC AND ANAEROBIC ANA 8ML AER 10ML   Culture NO GROWTH 5 DAYS  Final   Report Status 11/06/2015 FINAL  Final  Culture, blood (routine x 2)     Status: None   Collection Time: 11/11/2015 11:13 AM  Result Value Ref Range Status   Specimen Description BLOOD LEFT ARM  Final   Special Requests BOTTLES DRAWN AEROBIC AND ANAEROBIC [place]  Final   Culture NO GROWTH 5 DAYS  Final   Report Status 11/06/2015 FINAL  Final  Culture, blood (routine x 2)     Status: None   Collection Time: 11/02/15  2:17 AM  Result Value Ref Range Status   Specimen Description BLOOD RIGHT HAND  Final   Special Requests BOTTLES DRAWN AEROBIC AND ANAEROBIC 5CC  Final   Culture NO GROWTH 5 DAYS  Final   Report Status 11/07/2015 FINAL  Final  Culture, blood (routine x 2)     Status: None  Collection Time: 11/02/15  2:28 AM  Result Value Ref Range Status   Specimen Description BLOOD LEFT HAND  Final   Special Requests BOTTLES DRAWN AEROBIC AND ANAEROBIC [place]  Final   Culture NO GROWTH 5 DAYS  Final   Report Status 11/07/2015 FINAL  Final  MRSA PCR Screening     Status: None   Collection Time: 11/02/15 10:42 AM  Result Value Ref Range Status   MRSA by PCR NEGATIVE NEGATIVE Final    Comment:        The GeneXpert MRSA Assay (FDA approved for NASAL specimens only), is one  component of a comprehensive MRSA colonization surveillance program. It is not intended to diagnose MRSA infection nor to guide or monitor treatment for MRSA infections.     Coagulation Studies: No results for input(s): LABPROT, INR in the last 72 hours.  Urinalysis: No results for input(s): COLORURINE, LABSPEC, PHURINE, GLUCOSEU, HGBUR, BILIRUBINUR, KETONESUR, PROTEINUR, UROBILINOGEN, NITRITE, LEUKOCYTESUR in the last 72 hours.  Invalid input(s): APPERANCEUR    Imaging: No results found.   Medications:   . feeding supplement (VITAL HIGH PROTEIN) Stopped (11/08/15 0917)  . norepinephrine (LEVOPHED) Adult infusion 6 mcg/min (11/08/15 1005)  . propofol (DIPRIVAN) infusion Stopped (11/08/15 0917)  . pureflow 3 each (11/08/15 0545)   . aspirin  81 mg Oral Daily  . chlorhexidine  15 mL Mouth Rinse BID  . sennosides  5 mL Oral BID   And  . docusate  100 mg Oral BID  . famotidine (PEPCID) IV  20 mg Intravenous Q24H  . feeding supplement (PRO-STAT SUGAR FREE 64)  30 mL Per Tube Daily  . fentaNYL  50 mcg Intravenous Once  . heparin subcutaneous  5,000 Units Subcutaneous Q8H  . hydrocortisone sod succinate (SOLU-CORTEF) inj  50 mg Intravenous Q6H  . mouth rinse  15 mL Mouth Rinse Q2H  . piperacillin-tazobactam (ZOSYN)  IV  3.375 g Intravenous Q8H  . sodium chloride flush  10-40 mL Intracatheter Q12H  . sodium chloride flush  3 mL Intravenous Q12H   sodium chloride, sodium chloride, acetaminophen, fentaNYL (SUBLIMAZE) injection, heparin, ondansetron (ZOFRAN) IV, sodium chloride flush, sodium chloride flush  Assessment/ Plan:  Robert Parks is a 75 y.o. black male with coronary artery disease, systolic congestive heart failure, diabetes mellitus type II, gout, hypertension, hyperlipidemia, CVA. Admitted on 11/17/2015 for  Urinary tract infection, sepsis, acute respiratory failure and acute renal failure.   1. Acute renal failure on chronic kidney disease stage IV baseline  creatinine 2.3 EGFR 29 followed by [facility] nephrology. Anuric requiring CRRT. Acute renal failure from sepsis, urinary tract infection, hypotension and shock. Most likely ATN with poor chance of recovery.  - Hold CRRT today. Monitor for dialysis.  - Discussed case and prognosis with family.   2. Acute respiratory failure: on mechanical ventilation. Pressure support FiO24% - appreciate pulmonary input. Plan for extubation today.   3. Anemia of chronic kidney disease: hemoglobin 7.5 - not currently on epo - daily CBC. Low threshold for transfusion  4. Sepsis with urinary tract infection: E. Coli in urine culture - pip/tazo from 9/9    LOS: [place], [place] 9/14/201710:23 AM

## 2015-11-08 NOTE — Progress Notes (Signed)
[place] consultedfor piperacillin/tazobactam dosing, constipation and electrolyten management in this 75 yo AAM admitted for sepsis from UTI andacute cardiac arrest with multiorgan failure.  Pt currently on CRRT. Patient is to begin increased tube feeding today.  1. Piperacillin/tazobactam: Indication is for UTI and possible aspiration PNA. Patient currently on day 7 of piperacillin/tazobactam 3.375g IV q 8. Per rounds, will d/c after day 7 (9/14).  2. Constipation: Patient with multiple loose bowel movements. Will d/c senna/docusate.   3. Electrolytes: Will replace phos again with 30 mmol sodium phosphate. As CRRT stopped and patient is on tube feeds, will not replace potassium but will f/u AM labs.    Allergies  Allergen Reactions  . No Known Allergies Other (See Comments)    Patient Measurements: Height: 5\' 8"  (172.7 cm) Weight: 177 lb 4 oz (80.4 kg) IBW/kg (Calculated) : 68.4   Vital Signs: Temp: 97.7 F (36.5 C) (09/14 1300) Temp Source: Oral (09/14 1300) BP: 111/70 (09/14 1400) Pulse Rate: 91 (09/14 1400) Intake/Output from previous day: 09/13 0701 - 09/14 0700 In: 2343.8 [I.V.:346.1; NG/GT:1187.7; IV Piggyback:810] Out: 492 [Urine:7] Intake/Output from this shift: Total I/O In: 254.1 [I.V.:55.7; NG/GT:148.4; IV Piggyback:50] Out: 121 [Urine:25; Other:96]  Labs:  Recent Labs  11/06/15 0613 11/08/15 0559  WBC 6.9 6.4  HGB 7.8* 7.5*  HCT 23.0* 22.3*  PLT 77* 57*     Recent Labs  11/06/15 0613 11/06/15 1024  11/07/15 0558 11/07/15 1248 11/07/15 1709 11/08/15 0009 11/08/15 0559 11/08/15 1359  NA 137 138  < > 137 139 138 138 138  --   K 3.9 3.9  < > 4.1 3.9 3.9 3.6 3.4*  --   CL 106 106  < > 105 106 106 106 106  --   CO2 27 25  < > 23 25 24 26 27   --   GLUCOSE 120* 135*  < > 140* 163* 158* 166* 180*  --   BUN 12 12  < > 13 14 15 15 16   --   CREATININE 0.90 0.84  < > 0.83 0.84 0.84 0.77 0.80  --   CALCIUM 8.3* 8.3*  < > 8.4* 8.5*  8.3* 8.4* 8.4*  --   MG 1.8  --   --  1.7  --   --   --  1.8  --   PHOS 1.7*  1.8* 2.0*  < > 2.1* 2.0* 2.4* 1.3* 1.4* 1.5*  PROT  --  5.2*  --   --   --   --   --   --   --   ALBUMIN 2.7* 2.7*  < > 2.6* 2.6* 2.6* 2.5* 2.5*  --   AST  --  73*  --   --   --   --   --   --   --   ALT  --  37  --   --   --   --   --   --   --   ALKPHOS  --  78  --   --   --   --   --   --   --   BILITOT  --  1.2  --   --   --   --   --   --   --   TRIG  --   --   --   --  91  --   --   --   --   < > = values in  this interval not displayed. Estimated Creatinine Clearance: 77.2 mL/min (by C-G formula based on SCr of 0.8 mg/dL).    Recent Labs  11/08/15 0737 11/08/15 1151 11/08/15 1555  GLUCAP 174* 187* 172*    Medical History: Past Medical History:  Diagnosis Date  . Anginal pain ([place])    had angina 2-3 mths prior to visit  . Arthritis   . CHF (congestive heart failure) ([place])   . Chronic kidney disease    follwed by Nephrologist Dr Juanito Doom in [place], Stage 3  . Coronary artery disease   . DDD (degenerative disc disease), cervical   . Diabetes mellitus without complication (HCC)    DIET CONTROLLED   . Dysphagia   . Frequent urination   . GERD (gastroesophageal reflux disease)   . Gout   . Hyperlipidemia   . Hypertension   . MI (myocardial infarction) ([place])   . Orthopnea   . Pancytopenia ([place])   . Shortness of breath dyspnea    at rest, mainly when he's active  . Stroke [facility])    ?? small stroke om 2017  came and went    Ulice Dash, PharmD Clinical Pharmacist  11/08/2015 3:59 PM

## 2015-11-09 DIAGNOSIS — N4889 Other specified disorders of penis: Secondary | ICD-10-CM

## 2015-11-09 DIAGNOSIS — R338 Other retention of urine: Secondary | ICD-10-CM

## 2015-11-09 LAB — BASIC METABOLIC PANEL
Anion gap: 8 (ref 5–15)
BUN: 30 mg/dL — AB (ref 6–20)
CHLORIDE: 106 mmol/L (ref 101–111)
CO2: 26 mmol/L (ref 22–32)
CREATININE: 1.51 mg/dL — AB (ref 0.61–1.24)
Calcium: 8.3 mg/dL — ABNORMAL LOW (ref 8.9–10.3)
GFR calc Af Amer: 50 mL/min — ABNORMAL LOW (ref >60)
GFR calc non Af Amer: 43 mL/min — ABNORMAL LOW (ref >60)
GLUCOSE: 183 mg/dL — AB (ref 65–99)
POTASSIUM: 3.4 mmol/L — AB (ref 3.5–5.1)
SODIUM: 140 mmol/L (ref 135–145)

## 2015-11-09 LAB — GLUCOSE, CAPILLARY
GLUCOSE-CAPILLARY: 154 mg/dL — AB (ref 65–99)
GLUCOSE-CAPILLARY: 156 mg/dL — AB (ref 65–99)
GLUCOSE-CAPILLARY: 163 mg/dL — AB (ref 65–99)
GLUCOSE-CAPILLARY: 171 mg/dL — AB (ref 65–99)
Glucose-Capillary: 164 mg/dL — ABNORMAL HIGH (ref 65–99)
Glucose-Capillary: 166 mg/dL — ABNORMAL HIGH (ref 65–99)
Glucose-Capillary: 177 mg/dL — ABNORMAL HIGH (ref 65–99)

## 2015-11-09 LAB — CBC
HCT: 23.1 % — ABNORMAL LOW (ref 40.0–52.0)
HEMOGLOBIN: 7.6 g/dL — AB (ref 13.0–18.0)
MCH: 32 pg (ref 26.0–34.0)
MCHC: 33.1 g/dL (ref 32.0–36.0)
MCV: 96.6 fL (ref 80.0–100.0)
PLATELETS: 54 10*3/uL — AB (ref 150–440)
RBC: 2.39 MIL/uL — ABNORMAL LOW (ref 4.40–5.90)
RDW: 16.1 % — ABNORMAL HIGH (ref 11.5–14.5)
WBC: 5.6 10*3/uL (ref 3.8–10.6)

## 2015-11-09 LAB — MAGNESIUM: Magnesium: 1.9 mg/dL (ref 1.7–2.4)

## 2015-11-09 LAB — PHOSPHORUS: Phosphorus: 3.7 mg/dL (ref 2.5–4.6)

## 2015-11-09 MED ORDER — MAGNESIUM SULFATE IN D5W 1-5 GM/100ML-% IV SOLN
1.0000 g | Freq: Once | INTRAVENOUS | Status: AC
  Administered 2015-11-09: 1 g via INTRAVENOUS
  Filled 2015-11-09: qty 100

## 2015-11-09 MED ORDER — POTASSIUM CHLORIDE 10 MEQ/100ML IV SOLN
10.0000 meq | INTRAVENOUS | Status: AC
  Administered 2015-11-09 (×2): 10 meq via INTRAVENOUS
  Filled 2015-11-09 (×2): qty 100

## 2015-11-09 NOTE — Evaluation (Signed)
Clinical/Bedside Swallow Evaluation Patient Details  Name: Robert Parks MRN: [id number] Date of Birth: 08/17/1940  Today's Date: 11/09/2015 Time: SLP Start Time (ACUTE ONLY): 1015 SLP Stop Time (ACUTE ONLY): 1110 SLP Time Calculation (min) (ACUTE ONLY): 55 min  Past Medical History:  Past Medical History:  Diagnosis Date  . Anginal pain ([place])    had angina 2-3 mths prior to visit  . Arthritis   . CHF (congestive heart failure) ([place])   . Chronic kidney disease    follwed by Nephrologist Dr Juanito Doom in [place], Stage 3  . Coronary artery disease   . DDD (degenerative disc disease), cervical   . Diabetes mellitus without complication (HCC)    DIET CONTROLLED   . Dysphagia   . Frequent urination   . GERD (gastroesophageal reflux disease)   . Gout   . Hyperlipidemia   . Hypertension   . MI (myocardial infarction) ([place])   . Orthopnea   . Pancytopenia ([place])   . Shortness of breath dyspnea    at rest, mainly when he's active  . Stroke [facility])    ?? small stroke om 2017  came and went   Past Surgical History:  Past Surgical History:  Procedure Laterality Date  . APPENDECTOMY    . BACK SURGERY  1980'S  . CARDIAC CATHETERIZATION     back in late 1990's [facility]  . COLONOSCOPY WITH PROPOFOL N/A 08/21/2014   Procedure: COLONOSCOPY WITH PROPOFOL;  Surgeon: Hulen Luster, MD;  Location: [facility] ENDOSCOPY;  Service: Gastroenterology;  Laterality: N/A;  . CORONARY ARTERY BYPASS GRAFT  1994  . EYE SURGERY Left    Cataract   HPI:  Pt is a 75 y.o. 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 He is scheduled  to urology clinic on 13th September. Since he went home he is feeling generalized weakness and not eating very well also. He is able to walk with a cane. Today morning he could not get up or walk felt generalized weakness so family decided to bring him to the hospital. The next day on 11/02/15, he suffered a bradysystolic arrest and was orally intubated in CCU. He was extubated on 11/08/15. He is getting CRRT and may end up on permanent hemodialysis. Palliative Care has consulted w/ family re: poc. Pt is a DNR currently. Pt presents w/ dried, cracked lips d/t recent intubation. He is extremely weak and can only give 1-2 slight phonations. Noted a severely congested, weak cough.    Assessment / Plan / Recommendation Clinical Impression  Pt presented w/ moderate-severe oropharyngeal phase dysphagia during this eval today w/ increased risk for aspiration suspected. Pt was given single ice chips trials w/ oral phase deficits apparent c/b reduced lingual and labial movements for bolus manipulation and transfer for swallowing. Noted delayed and immediate coughing w/ 3/5 ice chip trials. Pt presents w/ a congested, weak cough at baseline which appears effortful for him and suspect the cough may nt be fully protective against aspiration. Due to pt's presentation w/ ice chip trials, MD was consulted re: poc including alternative means of feeding(PEG placement). MD will f/u w/ pt's family this afternoon. MD agreed w/ initiation of ice  chip trials in SMALL amounts w/ strict aspiration precautions w/ NSG supervision - stop if increased coughing or decline in respiratory status noted. ST services will f/u w/ pt's status next 1-3 days for any further trials to upgrade diet as appropriate.     Aspiration Risk  Severe aspiration risk;Risk for inadequate nutrition/hydration    Diet Recommendation  NPO w/ single ice chips(of 3-5 max) for oral stimulation and swallowing post oral care w/ NSG staff; strict aspiration precautions  and STOP ice chips if any decline in respiratory status or increased overt s/s of aspiration during trials.   Medication Administration: Via alternative means    Other  Recommendations Recommended Consults:  (Palliative care following) Oral Care Recommendations: Oral care QID;Staff/trained caregiver to provide oral care Other Recommendations:  (TBD)   Follow up Recommendations  Visit Diagnosis    (TBD)   UTI (lower urinary tract infection)  Hypotension, unspecified  Sepsis, due to unspecified organism ([place])  Acute on chronic renal failure ([place])  Encounter for orogastric (OG) tube placement  Endotracheally intubated  Acute respiratory failure ([place]) - Plan: DG Chest Port 1 View, DG Chest Port 1 View  Cardiac arrest [facility]) - Plan: CT HEAD WO CONTRAST, CT HEAD WO CONTRAST  Dyspnea  Swelling - Plan: US Venous Img Lower Bilateral, US Venous Img Lower Bilateral  Neck pain - Plan: CT CERVICAL SPINE WO CONTRAST, CT CERVICAL SPINE WO CONTRAST  Abdominal distension - Plan: DG Abd 1 View, DG Abd 1 View, CANCELED: DG Abd 1 View, CANCELED: DG Abd 1 View  Abdominal pain - Plan: DG Abd Portable 1V, DG Abd Portable 1V  Pleural effusion - Plan: DG Chest Port 1 [place], DG Chest Port 1 View    Frequency and Duration min 3x week  2 weeks       Prognosis Prognosis for Safe Diet Advancement: Guarded Barriers to Reach Goals: Severity of deficits      Swallow Study   General Date of Onset: 10/29/2015 HPI: Pt is a 75 y.o. 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 He is scheduled to urology clinic on 13th  September. Since he went home he is feeling generalized weakness and not eating very well also. He is able to walk with a cane. Today morning he could not get up or walk felt generalized weakness so family decided to bring him to the hospital. The next day on 11/02/15, he suffered a bradysystolic arrest and was orally intubated in CCU. He was extubated on 11/08/15. He is getting CRRT and may end up on permanent hemodialysis. Palliative Care has consulted w/ family re: poc. Pt is a DNR currently. Pt presents w/ dried, cracked lips d/t recent intubation. He is extremely weak and can only give 1-2 slight phonations. Noted a severely congested, weak cough.  Type of Study: Bedside Swallow Evaluation Previous Swallow Assessment: during previous admission on 10/12/15 - pt c/o discomfort when swallowing hard dry textures or large pills/ capsules. pt stated that the food would not "go down" when dry. pt requests a softer diet with moistened foods for ease of intake. pt and family member educated and agreed to diet change to a Dysphagia 3  w/ thin liquids. Family agreed w/ that poc at that time.  Diet Prior to this Study: Dysphagia 3 (soft);Thin liquids Temperature Spikes Noted:  (wbc not elevated) Respiratory Status: Nasal cannula (5 liters) History of Recent Intubation: Yes Length of Intubations (days): 7 days Date extubated: 11/08/15 Behavior/Cognition: Alert;Cooperative;Requires cueing (reduced overall energy for engagement in tasks) Oral Cavity Assessment: Erythema;Lesions;Dry;Dried secretions Oral Care Completed by SLP: Recent completion by staff Oral Cavity - Dentition: Adequate natural dentition Vision:  (n/a) Self-Feeding Abilities: Total assist Patient Positioning: Upright in bed (w/ head supported d/t neck brace) Baseline Vocal Quality: Low vocal intensity (almost aphonic - fatigue) Volitional Cough: Weak;Congested Volitional Swallow: Able to elicit    Oral/Motor/Sensory Function Overall Oral  Motor/Sensory Function:  (unable to assess d/t reduced ROM of lips/tongue)   Ice Chips Ice chips: Impaired Presentation: Spoon (fed; 5 trials) Oral Phase Impairments: Reduced labial seal;Reduced lingual movement/coordination;Impaired mastication Oral Phase Functional Implications: Prolonged oral transit;Oral residue Pharyngeal Phase Impairments: Cough - Immediate;Cough - Delayed;Suspected delayed Swallow (x3/5 trials) Other Comments: initial trials were more successful; fatigue factor as well   Thin Liquid Thin Liquid: Not tested    Nectar Thick Nectar Thick Liquid: Not tested   Honey Thick Honey Thick Liquid: Not tested   Puree Puree: Not tested   Solid   GO   Solid: Not tested         Orinda Kenner, MS, CCC-SLP  Robert Parks,Robert Parks 11/09/2015,1:42 PM

## 2015-11-09 NOTE — Progress Notes (Addendum)
[place] consultedfor constipation and electrolyten management in this 75 yo AAM admitted for sepsis from UTI andacute cardiac arrest with multiorgan failure.  Pt previously on CRRT. Patient is to begin increased tube feeding today.  1. Constipation: Patient with multiple loose bowel movements. No current orders for bowel regimens. Will sign off for now, please reconsult pharmacy if needed.   2. Electrolytes: 9/15: K 3.2; PO 3.7; Mg 1.9 Will replace potassium with KCL 5mEq IV and magnesium with magnesium 1g IV x1. CRRT stopped and patient is on tube feeds. Will follow up with AM labs.    Allergies  Allergen Reactions  . No Known Allergies Other (See Comments)    Patient Measurements: Height: 5\' 8"  (172.7 cm) Weight: 172 lb 4.8 oz (78.2 kg) IBW/kg (Calculated) : 68.4   Vital Signs: Temp: 97.9 F (36.6 C) (09/15 0253) Temp Source: Oral (09/15 0253) BP: 107/61 (09/15 0253) Pulse Rate: 87 (09/15 0253) Intake/Output from previous day: 09/14 0701 - 09/15 0700 In: 566.2 [I.V.:57.7; NG/GT:148.4; IV Piggyback:360] Out: 121 [Urine:25] Intake/Output from this shift: No intake/output data recorded.  Labs:  Recent Labs  11/08/15 0559 11/09/15 0712  WBC 6.4 5.6  HGB 7.5* 7.6*  HCT 22.3* 23.1*  PLT 57* 54*     Recent Labs  11/06/15 1024  11/07/15 0558 11/07/15 1248 11/07/15 1709 11/08/15 0009 11/08/15 0559 11/08/15 1359 11/09/15 0712  NA 138  < > 137 139 138 138 138  --  140  K 3.9  < > 4.1 3.9 3.9 3.6 3.4*  --  3.4*  CL 106  < > 105 106 106 106 106  --  106  CO2 25  < > 23 25 24 26 27   --  26  GLUCOSE 135*  < > 140* 163* 158* 166* 180*  --  183*  BUN 12  < > 13 14 15 15 16   --  30*  CREATININE 0.84  < > 0.83 0.84 0.84 0.77 0.80  --  1.51*  CALCIUM 8.3*  < > 8.4* 8.5* 8.3* 8.4* 8.4*  --  8.3*  MG  --   --  1.7  --   --   --  1.8  --  1.9  PHOS 2.0*  < > 2.1* 2.0* 2.4* 1.3* 1.4* 1.5*  --   PROT 5.2*  --   --   --   --   --   --   --   --    ALBUMIN 2.7*  < > 2.6* 2.6* 2.6* 2.5* 2.5*  --   --   AST 73*  --   --   --   --   --   --   --   --   ALT 37  --   --   --   --   --   --   --   --   ALKPHOS 78  --   --   --   --   --   --   --   --   BILITOT 1.2  --   --   --   --   --   --   --   --   TRIG  --   --   --  91  --   --   --   --   --   < > = values in this interval not displayed. Estimated Creatinine Clearance: 40.9 mL/min (by C-G formula based on SCr of 1.51  mg/dL (H)).    Recent Labs  11/09/15 0017 11/09/15 0434 11/09/15 0738  GLUCAP 171* 177* 163*    Medical History: Past Medical History:  Diagnosis Date  . Anginal pain ([place])    had angina 2-3 mths prior to visit  . Arthritis   . CHF (congestive heart failure) ([place])   . Chronic kidney disease    follwed by Nephrologist Dr Juanito Doom in [place], Stage 3  . Coronary artery disease   . DDD (degenerative disc disease), cervical   . Diabetes mellitus without complication (HCC)    DIET CONTROLLED   . Dysphagia   . Frequent urination   . GERD (gastroesophageal reflux disease)   . Gout   . Hyperlipidemia   . Hypertension   . MI (myocardial infarction) ([place])   . Orthopnea   . Pancytopenia ([place])   . Shortness of breath dyspnea    at rest, mainly when he's active  . Stroke [facility])    ?? small stroke om 2017  came and went    Carmin Richmond, PharmD 11/09/2015 9:10 AM

## 2015-11-09 NOTE — Progress Notes (Signed)
[name] [facility] [place] NAME: Robert Parks    MR#:  [id number]  DATE OF BIRTH:  [birth year]  SUBJECTIVE:  CHIEF COMPLAINT:   Chief Complaint  Patient presents with  . Extremity Weakness   The patient is 75 year old African-American male with past history significant for history of angina, arthritis, CHF, cardiomyopathy with ejection fraction of 35%, CK D with baseline creatinine of 2.5, coronary artery disease, who presents to the hospital with hypotension, elevated lactic acid level, pyuria, generalized weakness. Patient was admitted to the hospital for further evaluation and therapy for urinary tract infection. The patient developed cardiac arrest while in the hospital, PA and asystole lasting for approximately 10 minutes, transferred to CCU, intubated . He was noted to have worsening kidney failure, initiated on CRRT. He was seen by cardiologist. While in the hospital, who followed him along. Palliative care was consulted. Patient was extubated and transferred to the floor. He remains nothing by mouth, nonverbal. Intermittently able to follow commands  Review of Systems  Unable to perform ROS: Critical illness    VITAL SIGNS: Blood pressure (!) 93/55, pulse 78, temperature 97.8 F (36.6 C), temperature source Oral, resp. rate 17, height 5\' 8"  (1.727 m), weight 78.2 kg (172 lb 4.8 oz), SpO2 98 %.  PHYSICAL EXAMINATION:   GENERAL:  75 y.o.-year-old patient lying in the bedand mild to moderate distress due to intermittent cough. Somnolent and poorly arousable  EYES: Pupils equal, round, reactive to light and accommodation. No scleral icterus. Extraocular muscles intact.  HEENT: Head atraumatic, normocephalic. Oropharynx and nasopharynx clear.  NECK:  Supple, no jugular venous distention. No thyroid enlargement, no tenderness.  LUNGS:Diminishedh sounds bilaterally at bases, no wheezing, rales,rhonchi, intermittent palpitations . No use of  accessory muscles of respiration, Unless with exertion.  nonverbal  CARDIOVASCULAR: S1, S, distant . No murmurs, rubs, or gallops.  ABDOMEN: Soft, nontender, nondistended. Bowel sounds present. No organomegaly or mass.  EXTREMITIES:  1+ lower extremity and pedal edema,, no  cyanosis, or clubbing.  NEUROLOGIC: Cranial nerves II through XII are grossly  intact. Muscle strength 3/5 in all extremities. Sensation grossly  intact. Gait not checked.  PSYCHIATRIC: The patient isomnolent, unable to assess his orientation, intermittently follows commands.  SKIN: No obvious rash, lesion, or ulcer.   ORDERS/RESULTS REVIEWED:   CBC  Recent Labs Lab 11/04/15 0520 11/05/15 0530 11/06/15 0613 11/08/15 0559 11/09/15 0712  WBC 6.7 6.6 6.9 6.4 5.6  HGB 8.3* 7.7* 7.8* 7.5* 7.6*  HCT 24.5* 22.9* 23.0* 22.3* 23.1*  PLT 105* 83* 77* 57* 54*  MCV 93.1 93.7 94.4 94.8 96.6  MCH 31.6 31.5 31.9 31.9 32.0  MCHC 33.9 33.7 33.8 33.7 33.1  RDW 15.4* 15.2* 15.7* 15.5* 16.1*   ------------------------------------------------------------------------------------------------------------------  Chemistries   Recent Labs Lab 11/03/15 0147  11/05/15 0530  11/06/15 [id number] 11/06/15 1024  11/07/15 0558 11/07/15 1248 11/07/15 1709 11/08/15 0009 11/08/15 0559 11/09/15 0712  NA 138  < > 136  < > 137 138  < > 137 139 138 138 138 140  K 4.1  < > 3.3*  < > 3.9 3.9  < > 4.1 3.9 3.9 3.6 3.4* 3.4*  CL 107  < > 104  < > 106 106  < > 105 106 106 106 106 106  CO2 19*  < > 25  < > 27 25  < > 23 25 24 26 27 26   GLUCOSE 81  < > 114*  < > 120*  135*  < > 140* 163* 158* 166* 180* 183*  BUN 91*  < > 20  < > 12 12  < > 13 14 15 15 16  30*  CREATININE 4.46*  < > 1.04  < > 0.90 0.84  < > 0.83 0.84 0.84 0.77 0.80 1.51*  CALCIUM 8.2*  < > 8.1*  < > 8.3* 8.3*  < > 8.4* 8.5* 8.3* 8.4* 8.4* 8.3*  MG 2.0  < > 1.7  --  1.8  --   --  1.7  --   --   --  1.8 1.9  AST 48*  --   --   --   --  73*  --   --   --   --   --   --   --   ALT 20   --   --   --   --  37  --   --   --   --   --   --   --   ALKPHOS 72  --   --   --   --  78  --   --   --   --   --   --   --   BILITOT 1.0  --   --   --   --  1.2  --   --   --   --   --   --   --   < > = values in this interval not displayed. ------------------------------------------------------------------------------------------------------------------ estimated creatinine clearance is 40.9 mL/min (by C-G formula based on SCr of 1.51 mg/dL (H)). ------------------------------------------------------------------------------------------------------------------ No results for input(s): TSH, T4TOTAL, T3FREE, THYROIDAB in the last 72 hours.  Invalid input(s): FREET3  Cardiac Enzymes No results for input(s): CKMB, TROPONINI, MYOGLOBIN in the last 168 hours.  Invalid input(s): CK ------------------------------------------------------------------------------------------------------------------ Invalid input(s): POCBNP ---------------------------------------------------------------------------------------------------------------  RADIOLOGY: No results found.  EKG:  Orders placed or performed during the hospital encounter of 10/31/2015  . ED EKG  . ED EKG  . EKG 12-Lead  . EKG 12-Lead  . EKG 12-Lead  . EKG 12-Lead    ASSESSMENT AND PLAN:  Principal Problem:   UTI (lower urinary tract infection) Active Problems:   Cardiac arrest (HCC)   Acute respiratory failure with hypoxia (HCC)   Acute renal failure (HCC)   Sepsis ([place])   Palliative care by specialist   Acute on chronic renal failure ([place])   Endotracheally intubated #1 acute on chronic renal failure, now on hemodialysis. No urinary output noted, replace Foley catheter, urology consultation is requested. Appreciate nephrology's input, continue CRRT. Most likely ATN with poor chance for recovery, according to nephrologist #2. Acute respiratory failure with hypoxia due to cardiac arrest, status post intubation and extubation,  continue oxygen therapy as needed, following closely. Patient's fluid overloaded, chest x-ray revealed pulmonary edema, pleural effusions and atelectasis   #3. Urinary tract infection due to Klebsiella pneumonia, on zosyn, to complete course #4. Severe Sepsis with negative blood cultures due to urinary tract infection due to Klebsiella, continue Zosyn, blood pressure readings are still low, following closely, off pressors #5. Hypokalemia, supplement orally, if needed, potassium level is relatively stable #6. Anemia chronic disease, transfuse as needed  #7. Thrombocytopenia, worsening, stop aspirin, get HIT testing .   Management plans discussed with the patient, family and they are in agreement.   DRUG ALLERGIES:  Allergies  Allergen Reactions  . No Known Allergies Other (See Comments)    CODE STATUS:  Code Status Orders        Start     Ordered   11/05/15 1108  Do not attempt resuscitation (DNR)  Continuous    Question Answer Comment  In the event of cardiac or respiratory ARREST Do not call a "code blue"   In the event of cardiac or respiratory ARREST Do not perform Intubation, CPR, defibrillation or ACLS   In the event of cardiac or respiratory ARREST Use medication by any route, position, wound care, and other measures to relive pain and suffering. May use oxygen, suction and manual treatment of airway obstruction as needed for comfort.      11/05/15 1107    Code Status History    Date Active Date Inactive Code Status Order ID Comments User Context   11/02/2015  1:11 AM 11/05/2015 11:07 AM Full Code [id number]  Flora Lipps, MD Inpatient   11/17/2015  2:53 PM 11/02/2015  1:11 AM Full Code [id number]  Vaughan Basta, MD Inpatient   10/11/2015  5:31 PM 10/12/2015  6:04 PM Full Code [id number]  Demetrios Loll, MD ED      TOTAL Critical care TIME TAKING CARE OF THIS PATIENT: 40 minutes   discussed with patient's family  Symphony Demuro M.D on 11/09/2015 at 1:53 PM  Between 7am to  6pm - Pager - [phone]  After 6pm go to [email or web address] - password [email or web address] [place] [facility]  Office  [phone]  CC: Primary care physician; [facility]

## 2015-11-09 NOTE — Progress Notes (Signed)
[facility]  ROUNDING NOTE   Subjective:   Transferred to [place]. No urine output, foley catheter removed.  Family at bedside.   Objective:  Vital signs in last 24 hours:  Temp:  [97.7 F (36.5 C)-98.9 F (37.2 C)] 97.9 F (36.6 C) (09/15 0253) Pulse Rate:  [85-99] 87 (09/15 0253) Resp:  [16-36] 28 (09/15 0253) BP: (97-112)/(57-79) 107/61 (09/15 0253) SpO2:  [100 %] 100 % (09/15 0253) Weight:  [78.2 kg (172 lb 4.8 oz)] 78.2 kg (172 lb 4.8 oz) (09/15 0253)  Weight change: -2.245 kg (-4 lb 15.2 oz) Filed Weights   11/07/15 0500 11/08/15 0500 11/09/15 0253  Weight: 78 kg (171 lb 15.3 oz) 80.4 kg (177 lb 4 oz) 78.2 kg (172 lb 4.8 oz)    Intake/Output: I/O last 3 completed shifts: In: 1775.4 [I.V.:217.8; NG/GT:842.6; IV EPPIRJJOA:416] Out: 378 [Urine:25; Other:353]   Intake/Output this shift:  No intake/output data recorded.  Physical Exam: General: Critically ill   Head: [place]/AT, dry mucosal membranes  Eyes: Anicteric, eyes open  Neck: Neck brace  Lungs:  Room air, clear  Heart: S1S2, regular  Abdomen:  +distended  Extremities: + edema.  Neurologic: Able to follow commands  Skin: No lesions  Access: Right femoral temp HD catheter 9/9 Dr. Isidore Moos    Basic Metabolic Panel:  Recent Labs Lab 11/05/15 0530  11/06/15 6063  11/07/15 0160 11/07/15 1248 11/07/15 1709 11/08/15 0009 11/08/15 0559 11/08/15 1359 11/09/15 0712  NA 136  < > 137  < > 137 139 138 138 138  --  140  K 3.3*  < > 3.9  < > 4.1 3.9 3.9 3.6 3.4*  --  3.4*  CL 104  < > 106  < > 105 106 106 106 106  --  106  CO2 25  < > 27  < > 23 25 24 26 27   --  26  GLUCOSE 114*  < > 120*  < > 140* 163* 158* 166* 180*  --  183*  BUN 20  < > 12  < > 13 14 15 15 16   --  30*  CREATININE 1.04  < > 0.90  < > 0.83 0.84 0.84 0.77 0.80  --  1.51*  CALCIUM 8.1*  < > 8.3*  < > 8.4* 8.5* 8.3* 8.4* 8.4*  --  8.3*  MG 1.7  --  1.8  --  1.7  --   --   --  1.8  --  1.9  PHOS 1.9*  < > 1.7*  1.8*  < > 2.1* 2.0*  2.4* 1.3* 1.4* 1.5* 3.7  < > = values in this interval not displayed.  Liver Function Tests:  Recent Labs Lab 11/03/15 0147  11/06/15 1024  11/07/15 0558 11/07/15 1248 11/07/15 1709 11/08/15 0009 11/08/15 0559  AST 48*  --  73*  --   --   --   --   --   --   ALT 20  --  37  --   --   --   --   --   --   ALKPHOS 72  --  78  --   --   --   --   --   --   BILITOT 1.0  --  1.2  --   --   --   --   --   --   PROT 5.2*  --  5.2*  --   --   --   --   --   --  ALBUMIN 2.8*  < > 2.7*  < > 2.6* 2.6* 2.6* 2.5* 2.5*  < > = values in this interval not displayed. No results for input(s): LIPASE, AMYLASE in the last 168 hours. No results for input(s): AMMONIA in the last 168 hours.  CBC:  Recent Labs Lab 11/04/15 0520 11/05/15 0530 11/06/15 0613 11/08/15 0559 11/09/15 0712  WBC 6.7 6.6 6.9 6.4 5.6  HGB 8.3* 7.7* 7.8* 7.5* 7.6*  HCT 24.5* 22.9* 23.0* 22.3* 23.1*  MCV 93.1 93.7 94.4 94.8 96.6  PLT 105* 83* 77* 57* 54*    Cardiac Enzymes: No results for input(s): CKTOTAL, CKMB, CKMBINDEX, TROPONINI in the last 168 hours.  BNP: Invalid input(s): POCBNP  CBG:  Recent Labs Lab 11/08/15 2015 11/09/15 0017 11/09/15 0434 11/09/15 0738 11/09/15 1149  GLUCAP 178* 171* 177* 163* 154*    Microbiology: Results for orders placed or performed during the hospital encounter of 11/21/2015  Urine culture     Status: Abnormal   Collection Time: 11/18/2015 10:11 AM  Result Value Ref Range Status   Specimen Description URINE, RANDOM  Final   Special Requests NONE  Final   Culture >=100,000 COLONIES/mL KLEBSIELLA PNEUMONIAE (A)  Final   Report Status 11/03/2015 FINAL  Final   Organism ID, Bacteria KLEBSIELLA PNEUMONIAE (A)  Final      Susceptibility   Klebsiella pneumoniae - MIC*    AMPICILLIN >=32 RESISTANT Resistant     CEFAZOLIN <=4 SENSITIVE Sensitive     CEFTRIAXONE <=1 SENSITIVE Sensitive     CIPROFLOXACIN <=0.25 SENSITIVE Sensitive     GENTAMICIN <=1 SENSITIVE Sensitive      IMIPENEM <=0.25 SENSITIVE Sensitive     NITROFURANTOIN <=16 SENSITIVE Sensitive     TRIMETH/SULFA <=20 SENSITIVE Sensitive     AMPICILLIN/SULBACTAM 4 SENSITIVE Sensitive     PIP/TAZO <=4 SENSITIVE Sensitive     Extended ESBL NEGATIVE Sensitive     * >=100,000 COLONIES/mL KLEBSIELLA PNEUMONIAE  Culture, blood (routine x 2)     Status: None   Collection Time: 10/26/2015 11:13 AM  Result Value Ref Range Status   Specimen Description BLOOD RIGHT AC  Final   Special Requests   Final    BOTTLES DRAWN AEROBIC AND ANAEROBIC ANA 8ML AER 10ML   Culture NO GROWTH 5 DAYS  Final   Report Status 11/06/2015 FINAL  Final  Culture, blood (routine x 2)     Status: None   Collection Time: 10/26/2015 11:13 AM  Result Value Ref Range Status   Specimen Description BLOOD LEFT ARM  Final   Special Requests BOTTLES DRAWN AEROBIC AND ANAEROBIC [place]  Final   Culture NO GROWTH 5 DAYS  Final   Report Status 11/06/2015 FINAL  Final  Culture, blood (routine x 2)     Status: None   Collection Time: 11/02/15  2:17 AM  Result Value Ref Range Status   Specimen Description BLOOD RIGHT HAND  Final   Special Requests BOTTLES DRAWN AEROBIC AND ANAEROBIC 5CC  Final   Culture NO GROWTH 5 DAYS  Final   Report Status 11/07/2015 FINAL  Final  Culture, blood (routine x 2)     Status: None   Collection Time: 11/02/15  2:28 AM  Result Value Ref Range Status   Specimen Description BLOOD LEFT HAND  Final   Special Requests BOTTLES DRAWN AEROBIC AND ANAEROBIC [place]  Final   Culture NO GROWTH 5 DAYS  Final   Report Status 11/07/2015 FINAL  Final  MRSA PCR Screening  Status: None   Collection Time: 11/02/15 10:42 AM  Result Value Ref Range Status   MRSA by PCR NEGATIVE NEGATIVE Final    Comment:        The GeneXpert MRSA Assay (FDA approved for NASAL specimens only), is one component of a comprehensive MRSA colonization surveillance program. It is not intended to diagnose MRSA infection nor to guide or monitor treatment  for MRSA infections.     Coagulation Studies: No results for input(s): LABPROT, INR in the last 72 hours.  Urinalysis: No results for input(s): COLORURINE, LABSPEC, PHURINE, GLUCOSEU, HGBUR, BILIRUBINUR, KETONESUR, PROTEINUR, UROBILINOGEN, NITRITE, LEUKOCYTESUR in the last 72 hours.  Invalid input(s): APPERANCEUR    Imaging: No results found.   Medications:   . propofol (DIPRIVAN) infusion Stopped (11/08/15 0917)   . aspirin  81 mg Oral Daily  . chlorhexidine  15 mL Mouth Rinse BID  . famotidine (PEPCID) IV  20 mg Intravenous Q24H  . feeding supplement (PRO-STAT SUGAR FREE 64)  30 mL Per Tube Daily  . hydrocortisone sod succinate (SOLU-CORTEF) inj  50 mg Intravenous Q6H  . magnesium sulfate 1 - 4 g bolus IVPB  1 g Intravenous Once  . potassium chloride  10 mEq Intravenous Q1 Hr x 2  . sodium chloride flush  10-40 mL Intracatheter Q12H  . sodium chloride flush  3 mL Intravenous Q12H   sodium chloride, sodium chloride, acetaminophen, heparin, ondansetron (ZOFRAN) IV, sodium chloride flush, sodium chloride flush  Assessment/ Plan:  Robert Parks is a 75 y.o. black male with coronary artery disease, systolic congestive heart failure, diabetes mellitus type II, gout, hypertension, hyperlipidemia, CVA. Admitted on 11/13/2015 for  Urinary tract infection, sepsis, acute respiratory failure and acute renal failure.   1. Acute renal failure on chronic kidney disease stage IV baseline creatinine 2.3 EGFR 29 followed by [facility] nephrology. Anuric requiring CRRT. Acute renal failure from sepsis, urinary tract infection, hypotension and shock. Most likely ATN with poor chance of recovery.  - no indication for dialysis today. Monitor daily for dialysis. Tentatively plan for dialysis tomorrow.   2. Anemia of chronic kidney disease: hemoglobin 7.6 - EPO with dialysis treatment.  - daily CBC. Low threshold for transfusion  3. Hypokalemia:  - IV replacement ordered.   4. Sepsis with urinary  tract infection: E. Coli in urine culture. Completed course of IV antibiotics.    LOS: 8 Julio Storr 9/15/201712:10 PM

## 2015-11-09 NOTE — Consult Note (Signed)
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 Two day and pad dry today Scanned for 100 ml  Modifying factors: There are no other modifying factors  Associated signs and symptoms: There are no other associated signs and symptoms Aggravating and relieving factors: There are no other aggravating or relieving factors Severity: Moderate Duration: Persistent  Past Medical History:  Diagnosis Date  . Anginal pain ([place])    had angina 2-3 mths prior to visit  . Arthritis   . CHF (congestive heart failure) ([place])   . Chronic kidney disease    follwed by Nephrologist Dr Juanito Doom in [place], Stage 3  . Coronary artery disease   . DDD (degenerative disc disease), cervical   . Diabetes mellitus without complication (HCC)    DIET CONTROLLED   . Dysphagia   . Frequent urination   . GERD (gastroesophageal reflux disease)   . Gout   . Hyperlipidemia   . Hypertension   . MI (myocardial infarction) ([place])   . Orthopnea   . Pancytopenia ([place])   . Shortness of breath dyspnea    at rest, mainly when he's active  . Stroke [facility])    ?? small stroke om 2017  came and went   Past Surgical History:  Procedure Laterality Date  . APPENDECTOMY    . BACK SURGERY  1980'S  . CARDIAC CATHETERIZATION     back in late 1990's [facility]  . COLONOSCOPY WITH PROPOFOL N/A 08/21/2014   Procedure: COLONOSCOPY WITH PROPOFOL;  Surgeon: Hulen Luster, MD;  Location: [facility] ENDOSCOPY;  Service: Gastroenterology;  Laterality: N/A;  . CORONARY ARTERY BYPASS GRAFT  1994  . EYE SURGERY Left    Cataract    Medications: I have reviewed the patient's current medications. Allergies:  Allergies  Allergen Reactions  . No Known Allergies  Other (See Comments)    Family History  Problem Relation Age of Onset  . Heart attack Mother   . Cancer Father    Social History:  reports that he has quit smoking. His smoking use included Cigarettes. He quit after 30.00 years of use. He has never used smokeless tobacco. He reports that he does not drink alcohol or use drugs.  ROS: All systems are reviewed and negative except as noted. Rest negative  Physical Exam:  Vital signs in last 24 hours: Temp:  [97.8 F (36.6 C)-98.9 F (37.2 C)] 97.8 F (36.6 C) (09/15 1257) Pulse Rate:  [78-99] 78 (09/15 1257) Resp:  [17-36] 17 (09/15 1257) BP: (93-112)/(55-79) 93/55 (09/15 1257) SpO2:  [98 %-100 %] 98 % (09/15 1257) Weight:  [172 lb 4.8 oz (78.2 kg)] 172 lb 4.8 oz (78.2 kg) (09/15 0253)  Cardiovascular: Skin warm; not flushed Respiratory: Breaths quiet; no shortness of breath Abdomen: No masses Neurological: Normal sensation to touch Musculoskeletal: Normal motor function arms and legs Lymphatics: No inguinal adenopathy Skin: No rashes Genitourinary:moderate penile and scrotal edema  Laboratory Data:  Results for orders placed or performed during the hospital encounter of 11/09/2015 (from the past 72 hour(s))  Renal function     Status: Abnormal   Collection Time: 11/06/15  6:03 PM  Result Value Ref Range   Sodium 137 135 - 145 mmol/L   Potassium 4.4 3.5 - 5.1  mmol/L   Chloride 106 101 - 111 mmol/L   CO2 25 22 - 32 mmol/L   Glucose, Bld 151 (H) 65 - 99 mg/dL   BUN 11 6 - 20 mg/dL   Creatinine, Ser 0.86 0.61 - 1.24 mg/dL   Calcium 8.4 (L) 8.9 - 10.3 mg/dL   Phosphorus 2.0 (L) 2.5 - 4.6 mg/dL   Albumin 2.6 (L) 3.5 - 5.0 g/dL   GFR calc non Af Amer >60 >60 mL/min   GFR calc Af Amer >60 >60 mL/min    Comment: (NOTE) The eGFR has been calculated using the CKD EPI equation. This calculation has not been validated in all clinical situations. eGFR's persistently <60 mL/min signify possible Chronic Kidney Disease.    Anion  gap 6 5 - 15  Glucose, capillary     Status: Abnormal   Collection Time: 11/06/15  7:56 PM  Result Value Ref Range   Glucose-Capillary 136 (H) 65 - 99 mg/dL  Renal function     Status: Abnormal   Collection Time: 11/06/15 10:25 PM  Result Value Ref Range   Sodium 138 135 - 145 mmol/L   Potassium 4.3 3.5 - 5.1 mmol/L   Chloride 106 101 - 111 mmol/L   CO2 21 (L) 22 - 32 mmol/L   Glucose, Bld 135 (H) 65 - 99 mg/dL   BUN 13 6 - 20 mg/dL   Creatinine, Ser 0.83 0.61 - 1.24 mg/dL   Calcium 8.4 (L) 8.9 - 10.3 mg/dL   Phosphorus 2.7 2.5 - 4.6 mg/dL   Albumin 2.6 (L) 3.5 - 5.0 g/dL   GFR calc non Af Amer >60 >60 mL/min   GFR calc Af Amer >60 >60 mL/min    Comment: (NOTE) The eGFR has been calculated using the CKD EPI equation. This calculation has not been validated in all clinical situations. eGFR's persistently <60 mL/min signify possible Chronic Kidney Disease.    Anion gap 11 5 - 15  Glucose, capillary     Status: Abnormal   Collection Time: 11/06/15 11:48 PM  Result Value Ref Range   Glucose-Capillary 136 (H) 65 - 99 mg/dL  Renal function     Status: Abnormal   Collection Time: 11/07/15  2:12 AM  Result Value Ref Range   Sodium 138 135 - 145 mmol/L   Potassium 4.3 3.5 - 5.1 mmol/L   Chloride 107 101 - 111 mmol/L   CO2 23 22 - 32 mmol/L   Glucose, Bld 144 (H) 65 - 99 mg/dL   BUN 14 6 - 20 mg/dL   Creatinine, Ser 0.85 0.61 - 1.24 mg/dL   Calcium 8.5 (L) 8.9 - 10.3 mg/dL   Phosphorus 2.3 (L) 2.5 - 4.6 mg/dL   Albumin 2.6 (L) 3.5 - 5.0 g/dL   GFR calc non Af Amer >60 >60 mL/min   GFR calc Af Amer >60 >60 mL/min    Comment: (NOTE) The eGFR has been calculated using the CKD EPI equation. This calculation has not been validated in all clinical situations. eGFR's persistently <60 mL/min signify possible Chronic Kidney Disease.    Anion gap 8 5 - 15  Glucose, capillary     Status: None   Collection Time: 11/07/15  4:02 AM  Result Value Ref Range   Glucose-Capillary 77 65 -  99 mg/dL  Magnesium     Status: None   Collection Time: 11/07/15  5:58 AM  Result Value Ref Range   Magnesium 1.7 1.7 - 2.4 mg/dL  Renal function  Status: Abnormal   Collection Time: 11/07/15  5:58 AM  Result Value Ref Range   Sodium 137 135 - 145 mmol/L   Potassium 4.1 3.5 - 5.1 mmol/L   Chloride 105 101 - 111 mmol/L   CO2 23 22 - 32 mmol/L   Glucose, Bld 140 (H) 65 - 99 mg/dL   BUN 13 6 - 20 mg/dL   Creatinine, Ser 0.83 0.61 - 1.24 mg/dL   Calcium 8.4 (L) 8.9 - 10.3 mg/dL   Phosphorus 2.1 (L) 2.5 - 4.6 mg/dL   Albumin 2.6 (L) 3.5 - 5.0 g/dL   GFR calc non Af Amer >60 >60 mL/min   GFR calc Af Amer >60 >60 mL/min    Comment: (NOTE) The eGFR has been calculated using the CKD EPI equation. This calculation has not been validated in all clinical situations. eGFR's persistently <60 mL/min signify possible Chronic Kidney Disease.    Anion gap 9 5 - 15  Glucose, capillary     Status: Abnormal   Collection Time: 11/07/15  7:32 AM  Result Value Ref Range   Glucose-Capillary 146 (H) 65 - 99 mg/dL  Glucose, capillary     Status: Abnormal   Collection Time: 11/07/15 12:22 PM  Result Value Ref Range   Glucose-Capillary 141 (H) 65 - 99 mg/dL  Renal function     Status: Abnormal   Collection Time: 11/07/15 12:48 PM  Result Value Ref Range   Sodium 139 135 - 145 mmol/L   Potassium 3.9 3.5 - 5.1 mmol/L   Chloride 106 101 - 111 mmol/L   CO2 25 22 - 32 mmol/L   Glucose, Bld 163 (H) 65 - 99 mg/dL   BUN 14 6 - 20 mg/dL   Creatinine, Ser 0.84 0.61 - 1.24 mg/dL   Calcium 8.5 (L) 8.9 - 10.3 mg/dL   Phosphorus 2.0 (L) 2.5 - 4.6 mg/dL   Albumin 2.6 (L) 3.5 - 5.0 g/dL   GFR calc non Af Amer >60 >60 mL/min   GFR calc Af Amer >60 >60 mL/min    Comment: (NOTE) The eGFR has been calculated using the CKD EPI equation. This calculation has not been validated in all clinical situations. eGFR's persistently <60 mL/min signify possible Chronic Kidney Disease.    Anion gap 8 5 - 15   Triglycerides     Status: None   Collection Time: 11/07/15 12:48 PM  Result Value Ref Range   Triglycerides 91 <150 mg/dL  Glucose, capillary     Status: Abnormal   Collection Time: 11/07/15  3:54 PM  Result Value Ref Range   Glucose-Capillary 157 (H) 65 - 99 mg/dL  Blood gas, arterial     Status: Abnormal   Collection Time: 11/07/15  4:37 PM  Result Value Ref Range   FIO2 0.24    Delivery systems VENTILATOR    Peep/cpap 5.0 cm H20   Pressure support 8.0 cm H20   pH, Arterial 7.28 (L) 7.350 - 7.450   pCO2 arterial 44 32.0 - 48.0 mmHg   pO2, Arterial 65 (L) 83.0 - 108.0 mmHg   Bicarbonate 20.7 20.0 - 28.0 mmol/L   Acid-base deficit 5.7 (H) 0.0 - 2.0 mmol/L   O2 Saturation 89.4 %   Patient temperature 37.0    Collection site LEFT RADIAL    Sample type ARTERIAL DRAW    Allens test (pass/fail) PASS PASS  Renal function     Status: Abnormal   Collection Time: 11/07/15  5:09 PM  Result Value Ref Range  Sodium 138 135 - 145 mmol/L   Potassium 3.9 3.5 - 5.1 mmol/L   Chloride 106 101 - 111 mmol/L   CO2 24 22 - 32 mmol/L   Glucose, Bld 158 (H) 65 - 99 mg/dL   BUN 15 6 - 20 mg/dL   Creatinine, Ser 0.84 0.61 - 1.24 mg/dL   Calcium 8.3 (L) 8.9 - 10.3 mg/dL   Phosphorus 2.4 (L) 2.5 - 4.6 mg/dL   Albumin 2.6 (L) 3.5 - 5.0 g/dL   GFR calc non Af Amer >60 >60 mL/min   GFR calc Af Amer >60 >60 mL/min    Comment: (NOTE) The eGFR has been calculated using the CKD EPI equation. This calculation has not been validated in all clinical situations. eGFR's persistently <60 mL/min signify possible Chronic Kidney Disease.    Anion gap 8 5 - 15  Glucose, capillary     Status: Abnormal   Collection Time: 11/07/15  8:14 PM  Result Value Ref Range   Glucose-Capillary 136 (H) 65 - 99 mg/dL  Renal function     Status: Abnormal   Collection Time: 11/08/15 12:09 AM  Result Value Ref Range   Sodium 138 135 - 145 mmol/L   Potassium 3.6 3.5 - 5.1 mmol/L   Chloride 106 101 - 111 mmol/L   CO2 26  22 - 32 mmol/L   Glucose, Bld 166 (H) 65 - 99 mg/dL   BUN 15 6 - 20 mg/dL   Creatinine, Ser 0.77 0.61 - 1.24 mg/dL   Calcium 8.4 (L) 8.9 - 10.3 mg/dL   Phosphorus 1.3 (L) 2.5 - 4.6 mg/dL   Albumin 2.5 (L) 3.5 - 5.0 g/dL   GFR calc non Af Amer >60 >60 mL/min   GFR calc Af Amer >60 >60 mL/min    Comment: (NOTE) The eGFR has been calculated using the CKD EPI equation. This calculation has not been validated in all clinical situations. eGFR's persistently <60 mL/min signify possible Chronic Kidney Disease.    Anion gap 6 5 - 15  Glucose, capillary     Status: Abnormal   Collection Time: 11/08/15 12:13 AM  Result Value Ref Range   Glucose-Capillary 144 (H) 65 - 99 mg/dL  Glucose, capillary     Status: Abnormal   Collection Time: 11/08/15  4:11 AM  Result Value Ref Range   Glucose-Capillary 148 (H) 65 - 99 mg/dL  Magnesium     Status: None   Collection Time: 11/08/15  5:59 AM  Result Value Ref Range   Magnesium 1.8 1.7 - 2.4 mg/dL  CBC     Status: Abnormal   Collection Time: 11/08/15  5:59 AM  Result Value Ref Range   WBC 6.4 3.8 - 10.6 K/uL   RBC 2.36 (L) 4.40 - 5.90 MIL/uL   Hemoglobin 7.5 (L) 13.0 - 18.0 g/dL   HCT 22.3 (L) 40.0 - 52.0 %   MCV 94.8 80.0 - 100.0 fL   MCH 31.9 26.0 - 34.0 pg   MCHC 33.7 32.0 - 36.0 g/dL   RDW 15.5 (H) 11.5 - 14.5 %   Platelets 57 (L) 150 - 440 K/uL  Renal function     Status: Abnormal   Collection Time: 11/08/15  5:59 AM  Result Value Ref Range   Sodium 138 135 - 145 mmol/L   Potassium 3.4 (L) 3.5 - 5.1 mmol/L   Chloride 106 101 - 111 mmol/L   CO2 27 22 - 32 mmol/L   Glucose, Bld 180 (H) 65 - 99 mg/dL  BUN 16 6 - 20 mg/dL   Creatinine, Ser 0.80 0.61 - 1.24 mg/dL   Calcium 8.4 (L) 8.9 - 10.3 mg/dL   Phosphorus 1.4 (L) 2.5 - 4.6 mg/dL   Albumin 2.5 (L) 3.5 - 5.0 g/dL   GFR calc non Af Amer >60 >60 mL/min   GFR calc Af Amer >60 >60 mL/min    Comment: (NOTE) The eGFR has been calculated using the CKD EPI equation. This calculation has  not been validated in all clinical situations. eGFR's persistently <60 mL/min signify possible Chronic Kidney Disease.    Anion gap 5 5 - 15  Glucose, capillary     Status: Abnormal   Collection Time: 11/08/15  7:37 AM  Result Value Ref Range   Glucose-Capillary 174 (H) 65 - 99 mg/dL  Glucose, capillary     Status: Abnormal   Collection Time: 11/08/15 11:51 AM  Result Value Ref Range   Glucose-Capillary 187 (H) 65 - 99 mg/dL  Phosphorus     Status: Abnormal   Collection Time: 11/08/15  1:59 PM  Result Value Ref Range   Phosphorus 1.5 (L) 2.5 - 4.6 mg/dL  Glucose, capillary     Status: Abnormal   Collection Time: 11/08/15  3:55 PM  Result Value Ref Range   Glucose-Capillary 172 (H) 65 - 99 mg/dL  Glucose, capillary     Status: Abnormal   Collection Time: 11/08/15  8:15 PM  Result Value Ref Range   Glucose-Capillary 178 (H) 65 - 99 mg/dL  Glucose, capillary     Status: Abnormal   Collection Time: 11/09/15 12:17 AM  Result Value Ref Range   Glucose-Capillary 171 (H) 65 - 99 mg/dL  Glucose, capillary     Status: Abnormal   Collection Time: 11/09/15  4:34 AM  Result Value Ref Range   Glucose-Capillary 177 (H) 65 - 99 mg/dL   Comment 1 Notify RN   Magnesium     Status: None   Collection Time: 11/09/15  7:12 AM  Result Value Ref Range   Magnesium 1.9 1.7 - 2.4 mg/dL  CBC     Status: Abnormal   Collection Time: 11/09/15  7:12 AM  Result Value Ref Range   WBC 5.6 3.8 - 10.6 K/uL   RBC 2.39 (L) 4.40 - 5.90 MIL/uL   Hemoglobin 7.6 (L) 13.0 - 18.0 g/dL   HCT 23.1 (L) 40.0 - 52.0 %   MCV 96.6 80.0 - 100.0 fL   MCH 32.0 26.0 - 34.0 pg   MCHC 33.1 32.0 - 36.0 g/dL   RDW 16.1 (H) 11.5 - 14.5 %   Platelets 54 (L) 150 - 440 K/uL  Basic metabolic panel     Status: Abnormal   Collection Time: 11/09/15  7:12 AM  Result Value Ref Range   Sodium 140 135 - 145 mmol/L   Potassium 3.4 (L) 3.5 - 5.1 mmol/L   Chloride 106 101 - 111 mmol/L   CO2 26 22 - 32 mmol/L   Glucose, Bld 183 (H) 65  - 99 mg/dL   BUN 30 (H) 6 - 20 mg/dL   Creatinine, Ser 1.51 (H) 0.61 - 1.24 mg/dL   Calcium 8.3 (L) 8.9 - 10.3 mg/dL   GFR calc non Af Amer 43 (L) >60 mL/min   GFR calc Af Amer 50 (L) >60 mL/min    Comment: (NOTE) The eGFR has been calculated using the CKD EPI equation. This calculation has not been validated in all clinical situations. eGFR's persistently <60 mL/min signify possible  Chronic Kidney Disease.    Anion gap 8 5 - 15  Phosphorus     Status: None   Collection Time: 11/09/15  7:12 AM  Result Value Ref Range   Phosphorus 3.7 2.5 - 4.6 mg/dL  Glucose, capillary     Status: Abnormal   Collection Time: 11/09/15  7:38 AM  Result Value Ref Range   Glucose-Capillary 163 (H) 65 - 99 mg/dL   Comment 1 Notify RN   Glucose, capillary     Status: Abnormal   Collection Time: 11/09/15 11:49 AM  Result Value Ref Range   Glucose-Capillary 154 (H) 65 - 99 mg/dL   Comment 1 Notify RN    Recent Results (from the past 240 hour(s))  Urine culture     Status: Abnormal   Collection Time: 10/30/2015 10:11 AM  Result Value Ref Range Status   Specimen Description URINE, RANDOM  Final   Special Requests NONE  Final   Culture >=100,000 COLONIES/mL KLEBSIELLA PNEUMONIAE (A)  Final   Report Status 11/03/2015 FINAL  Final   Organism ID, Bacteria KLEBSIELLA PNEUMONIAE (A)  Final      Susceptibility   Klebsiella pneumoniae - MIC*    AMPICILLIN >=32 RESISTANT Resistant     CEFAZOLIN <=4 SENSITIVE Sensitive     CEFTRIAXONE <=1 SENSITIVE Sensitive     CIPROFLOXACIN <=0.25 SENSITIVE Sensitive     GENTAMICIN <=1 SENSITIVE Sensitive     IMIPENEM <=0.25 SENSITIVE Sensitive     NITROFURANTOIN <=16 SENSITIVE Sensitive     TRIMETH/SULFA <=20 SENSITIVE Sensitive     AMPICILLIN/SULBACTAM 4 SENSITIVE Sensitive     PIP/TAZO <=4 SENSITIVE Sensitive     Extended ESBL NEGATIVE Sensitive     * >=100,000 COLONIES/mL KLEBSIELLA PNEUMONIAE  Culture, blood (routine x 2)     Status: None   Collection Time:  11/18/2015 11:13 AM  Result Value Ref Range Status   Specimen Description BLOOD RIGHT AC  Final   Special Requests   Final    BOTTLES DRAWN AEROBIC AND ANAEROBIC ANA 8ML AER 10ML   Culture NO GROWTH 5 DAYS  Final   Report Status 11/06/2015 FINAL  Final  Culture, blood (routine x 2)     Status: None   Collection Time: 11/24/2015 11:13 AM  Result Value Ref Range Status   Specimen Description BLOOD LEFT ARM  Final   Special Requests BOTTLES DRAWN AEROBIC AND ANAEROBIC [place]  Final   Culture NO GROWTH 5 DAYS  Final   Report Status 11/06/2015 FINAL  Final  Culture, blood (routine x 2)     Status: None   Collection Time: 11/02/15  2:17 AM  Result Value Ref Range Status   Specimen Description BLOOD RIGHT HAND  Final   Special Requests BOTTLES DRAWN AEROBIC AND ANAEROBIC 5CC  Final   Culture NO GROWTH 5 DAYS  Final   Report Status 11/07/2015 FINAL  Final  Culture, blood (routine x 2)     Status: None   Collection Time: 11/02/15  2:28 AM  Result Value Ref Range Status   Specimen Description BLOOD LEFT HAND  Final   Special Requests BOTTLES DRAWN AEROBIC AND ANAEROBIC [place]  Final   Culture NO GROWTH 5 DAYS  Final   Report Status 11/07/2015 FINAL  Final  MRSA PCR Screening     Status: None   Collection Time: 11/02/15 10:42 AM  Result Value Ref Range Status   MRSA by PCR NEGATIVE NEGATIVE Final    Comment:  The GeneXpert MRSA Assay (FDA approved for NASAL specimens only), is one component of a comprehensive MRSA colonization surveillance program. It is not intended to diagnose MRSA infection nor to guide or monitor treatment for MRSA infections.    Creatinine:  Recent Labs  11/07/15 0212 11/07/15 0558 11/07/15 1248 11/07/15 1709 11/08/15 0009 11/08/15 0559 11/09/15 7680  CREATININE 0.85 0.83 0.84 0.84 0.77 0.80 1.51*    Xrays: See report/chart none  Impression/Assessment:  Penile edema will resolve with diuretics or dialysis  Plan:  14 Fr foley inserted easily;  see PRN; manage with foley  Siyon Linck A 11/09/2015, 4:18 PM

## 2015-11-09 NOTE — Plan of Care (Signed)
Problem: Education: Goal: Knowledge of [facility] Education information/materials will improve Outcome: Not Progressing Patient is nonverbal but obeys commands.  Problem: Skin Integrity: Goal: Risk for impaired skin integrity will decrease Outcome: Not Progressing Patient has pressure ulcers.  Problem: Activity: Goal: Risk for activity intolerance will decrease Outcome: Not Progressing Patient hasBLE weakness.  Problem: Fluid Volume: Goal: Ability to maintain a balanced intake and output will improve Outcome: Not Progressing Patient is NPO due to swallowing difficulty.  Problem: Nutrition: Goal: Adequate nutrition will be maintained Outcome: Not Progressing NPO

## 2015-11-09 NOTE — Progress Notes (Signed)
Notified attending MD that 2 unsuccessful attempts were made to insert foley catheter. Catheter coiled up in penis. One with a regular foley and the other with coude. Pt tolerated minimally. Pt resting in bed. Family present. Continue to monitor.

## 2015-11-09 NOTE — Care Management Important Message (Signed)
Important Message  Patient Details  Name: Robert Parks MRN: [id number] Date of Birth: Nov 16, 1940   Medicare Important Message Given:  Yes    Jolly Mango, RN 11/09/2015, 1:18 PM

## 2015-11-09 NOTE — Progress Notes (Signed)
SUBJECTIVE: No chest pain or shortness of breath responds to some extent with arousal.   Vitals:   11/09/15 0130 11/09/15 0200 11/09/15 0215 11/09/15 0253  BP: 97/62 99/60 103/67 107/61  Pulse: 87 86 89 87  Resp: (!) 30 (!) 31 (!) 34 (!) 28  Temp:   98.9 F (37.2 C) 97.9 F (36.6 C)  TempSrc:   Axillary Oral  SpO2: 100% 100% 100% 100%  Weight:    172 lb 4.8 oz (78.2 kg)  Height:        Intake/Output Summary (Last 24 hours) at 11/09/15 0814 Last data filed at 11/08/15 2034  Gross per 24 hour  Intake           484.76 ml  Output               72 ml  Net           412.76 ml    LABS: Basic Metabolic Panel:  Recent Labs  11/08/15 0559 11/08/15 1359 11/09/15 0712  NA 138  --  140  K 3.4*  --  3.4*  CL 106  --  106  CO2 27  --  26  GLUCOSE 180*  --  183*  BUN 16  --  30*  CREATININE 0.80  --  1.51*  CALCIUM 8.4*  --  8.3*  MG 1.8  --  1.9  PHOS 1.4* 1.5*  --    Liver Function Tests:  Recent Labs  11/06/15 1024  11/08/15 0009 11/08/15 0559  AST 73*  --   --   --   ALT 37  --   --   --   ALKPHOS 78  --   --   --   BILITOT 1.2  --   --   --   PROT 5.2*  --   --   --   ALBUMIN 2.7*  < > 2.5* 2.5*  < > = values in this interval not displayed. No results for input(s): LIPASE, AMYLASE in the last 72 hours. CBC:  Recent Labs  11/08/15 0559 11/09/15 0712  WBC 6.4 5.6  HGB 7.5* 7.6*  HCT 22.3* 23.1*  MCV 94.8 96.6  PLT 57* 54*   Cardiac Enzymes: No results for input(s): CKTOTAL, CKMB, CKMBINDEX, TROPONINI in the last 72 hours. BNP: Invalid input(s): POCBNP D-Dimer: No results for input(s): DDIMER in the last 72 hours. Hemoglobin A1C: No results for input(s): HGBA1C in the last 72 hours. Fasting Lipid Panel:  Recent Labs  11/07/15 1248  TRIG 91   Thyroid Function Tests: No results for input(s): TSH, T4TOTAL, T3FREE, THYROIDAB in the last 72 hours.  Invalid input(s): FREET3 Anemia Panel: No results for input(s): VITAMINB12, FOLATE, FERRITIN,  TIBC, IRON, RETICCTPCT in the last 72 hours.   PHYSICAL EXAM General: Well developed, well nourished, in no acute distress HEENT:  Normocephalic and atramatic Neck:  No JVD.  Lungs: Clear bilaterally to auscultation and percussion. Heart: HRRR . Normal S1 and S2 without gallops or murmurs.  Abdomen: Bowel sounds are positive, abdomen soft and non-tender  Msk:  Back normal, normal gait. Normal strength and tone for age. Extremities: No clubbing, cyanosis or edema.   Neuro: Alert and oriented X 3. 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 Patient states family does not want him to be intubated again. Advise conservative treatment and follow-up in the office upon discharge.  Principal Problem:  UTI (lower urinary tract infection) Active Problems:   Cardiac arrest (HCC)   Acute respiratory failure with hypoxia (HCC)   Acute renal failure (HCC)   Sepsis ([place])   Palliative care by specialist   Acute on chronic renal failure [facility])   Endotracheally intubated    Robert David, MD, [facility] 11/09/2015 8:14 AM

## 2015-11-09 NOTE — Progress Notes (Addendum)
Nutrition Follow-up  DOCUMENTATION CODES:   Not applicable  INTERVENTION:  -Monitor poc and pt and family's wishes.  ??PEG or comfort car Recommend NG tube feeding if decision regarding nutrition and poc is delayed.     NUTRITION DIAGNOSIS:   Inadequate oral intake related to acute illness, dysphagia as evidenced by NPO status.  ongoing  GOAL:   Patient will meet greater than or equal to 90% of their needs  Not meeting needs  MONITOR:    (poc)  REASON FOR ASSESSMENT:   Ventilator, Consult Poor PO  ASSESSMENT:     Pt extubated on 9/14.  No dialysis today but likely dialysis tomorrow. SLP note reviewed and recommending alternative means of nutrition (PEG placement).  Noted Palliative care consulted.   Medications and labs reviewed: K 3.4, BUN 30, creatinine 1.51, glucose 183  Diet Order:  Diet NPO time specified  Skin:  Wound (see comment) (stage II on sacrum and buttock)  Last BM:  9/14  Height:   Ht Readings from Last 1 Encounters:  11/20/2015 5\' 8"  (1.727 m)    Weight:   Wt Readings from Last 1 Encounters:  11/09/15 172 lb 4.8 oz (78.2 kg)    Ideal Body Weight:  70 kg  BMI:  Body mass index is 26.2 kg/m.  Estimated Nutritional Needs:   Kcal:  1950-2300 kcals/d  Protein:  94-117 g/d  Fluid:  1022ml + UOP  EDUCATION NEEDS:   No education needs identified at this time  Robert Parks, Robert Parks, Robert Parks (pager) Weekend/On-Call pager [phone])

## 2015-11-10 LAB — BASIC METABOLIC PANEL
ANION GAP: 8 (ref 5–15)
BUN: 50 mg/dL — ABNORMAL HIGH (ref 6–20)
CALCIUM: 8.4 mg/dL — AB (ref 8.9–10.3)
CO2: 25 mmol/L (ref 22–32)
Chloride: 106 mmol/L (ref 101–111)
Creatinine, Ser: 2.49 mg/dL — ABNORMAL HIGH (ref 0.61–1.24)
GFR calc non Af Amer: 24 mL/min — ABNORMAL LOW (ref >60)
GFR, EST AFRICAN AMERICAN: 28 mL/min — AB (ref >60)
GLUCOSE: 163 mg/dL — AB (ref 65–99)
POTASSIUM: 4.1 mmol/L (ref 3.5–5.1)
Sodium: 139 mmol/L (ref 135–145)

## 2015-11-10 LAB — CBC
HEMATOCRIT: 22.5 % — AB (ref 40.0–52.0)
HEMOGLOBIN: 7.5 g/dL — AB (ref 13.0–18.0)
MCH: 32.4 pg (ref 26.0–34.0)
MCHC: 33.4 g/dL (ref 32.0–36.0)
MCV: 97.2 fL (ref 80.0–100.0)
Platelets: 58 10*3/uL — ABNORMAL LOW (ref 150–440)
RBC: 2.32 MIL/uL — AB (ref 4.40–5.90)
RDW: 16.4 % — AB (ref 11.5–14.5)
WBC: 6.5 10*3/uL (ref 3.8–10.6)

## 2015-11-10 LAB — GLUCOSE, CAPILLARY
GLUCOSE-CAPILLARY: 167 mg/dL — AB (ref 65–99)
GLUCOSE-CAPILLARY: 150 mg/dL — AB (ref 65–99)
Glucose-Capillary: 180 mg/dL — ABNORMAL HIGH (ref 65–99)

## 2015-11-10 LAB — PHOSPHORUS: Phosphorus: 4.4 mg/dL (ref 2.5–4.6)

## 2015-11-10 LAB — MAGNESIUM: MAGNESIUM: 2.2 mg/dL (ref 1.7–2.4)

## 2015-11-10 LAB — TRIGLYCERIDES: TRIGLYCERIDES: 114 mg/dL (ref <150)

## 2015-11-10 MED ORDER — MIDODRINE HCL 5 MG PO TABS: 5.0000 mg | ORAL_TABLET | Freq: Three times a day (TID) | ORAL | Status: DC

## 2015-11-10 MED ORDER — FAMOTIDINE IN NACL 20-0.9 MG/50ML-% IV SOLN: 20.0000 mg | INTRAVENOUS | Status: DC

## 2015-11-10 MED ORDER — PREDNISONE 20 MG PO TABS: 50.0000 mg | ORAL_TABLET | Freq: Every day | ORAL | Status: DC

## 2015-11-10 MED ORDER — HYDROCORTISONE NA SUCCINATE PF 100 MG IJ SOLR
50.0000 mg | Freq: Four times a day (QID) | INTRAMUSCULAR | Status: DC
  Filled 2015-11-10 (×4): qty 1

## 2015-11-11 LAB — HEPARIN INDUCED PLATELET AB (HIT ANTIBODY): HEPARIN INDUCED PLT AB: 0.165 {OD_unit} (ref 0.000–0.400)

## 2015-11-11 LAB — HEPATITIS B SURFACE ANTIBODY,QUALITATIVE: Hep B S Ab: NONREACTIVE

## 2015-11-11 LAB — HEPATITIS B CORE ANTIBODY, TOTAL: HEP B C TOTAL AB: NEGATIVE

## 2015-11-11 LAB — HEPATITIS B SURFACE ANTIGEN: HEP B S AG: NEGATIVE

## 2015-11-13 LAB — SEROTONIN RELEASE ASSAY (SRA)
SRA .2 IU/mL UFH Ser-aCnc: <1 % (ref 0–20)
SRA 100IU/mL UFH Ser-aCnc: <1 % (ref 0–20)

## 2015-11-25 NOTE — Progress Notes (Signed)
PRE HD   

## 2015-11-25 NOTE — Progress Notes (Signed)
Rapid response called from Dialysis Unit lower level at  2pm. Jeanene Erb, RN [place] from ICU present at bedside at 1403 when I arrived. Pt. found to be without signs of life. No pulse or respirations. Dialysis nurse- Hermenia Bers, RN at bedside and stated patient passed during dialysis,(see her notes) and that he was a DNR. She had paged Dr. Abigail Butts to make him aware. I called 2C and spoke with Kenney Houseman, [place] to ask about his condition prior to dialysis and if family had been here this am. Kenney Houseman stated that his primary care nurse on [place] said the family had been here this am but had already left and they knew he was going to have dialysis this am. Jeanene Erb, RN paged Dr.[place]. Dr. Ether Griffins pronounced patient deceased at 21.  2C nurse called and left message with wife that she needed to return to hospital due to change in status. Awaiting family. [place] paged and made aware of death.

## 2015-11-25 NOTE — Progress Notes (Signed)
Pre HD assessment  

## 2015-11-25 NOTE — Progress Notes (Signed)
Attempted to call patient wife, left message to call back.

## 2015-11-25 NOTE — Significant Event (Signed)
Rapid Response Event Note  Overview: Time Called: 1400 Arrival Time: 1402 Event Type: Cardiac  Initial Focused Assessment:   Interventions:  Plan of Care (if not transferred):  Event Summary:   at      at          [place] [facility]

## 2015-11-25 NOTE — Progress Notes (Signed)
Primary RN provided post-mortem care with Christie Beckers, Nurse Tech. Trialysis cath, IV site, and foley removed. Pt bedding changed and cleaned.

## 2015-11-25 NOTE — Progress Notes (Signed)
Pt off machine, Rapid response notified,

## 2015-11-25 NOTE — Significant Event (Addendum)
Rapid Response Event Note  Overview:  Rapid Response called for bradycardia.   Initial Focused Assessment:  This RN arrived to pt bradycardia in the 20's and 30's on monitor. Right radial pulse checked and no pulse noted. Stethoscope use to auscultate heart sounds and none noted. Pt noted to not be breathing also.  Pt was a DNR.  Interventions: Floor RN notified by Leeann Must [facility]. Dr. Ether Griffins notified by floor RN.  Plan of Care (if not transferred): None   Event Summary:   at      at          [place] [facility]

## 2015-11-25 NOTE — Discharge Summary (Signed)
[facility] [place] NAME: Robert Parks    MR#:  [id number]  DATE OF BIRTH:  [birth year]  DATE OF ADMISSION:  10/31/2015 ADMITTING PHYSICIAN: Irine Seal, MD  DATE OF DISCHARGE: No discharge date for patient encounter.  PRIMARY CARE PHYSICIAN: [facility]     ADMISSION DIAGNOSIS:  Hypotension, unspecified [I95.9] UTI (lower urinary tract infection) [N39.0] Acute on chronic renal failure (HCC) [N17.9, N18.9] Sepsis, due to unspecified organism ([place]) [A41.9]  DISCHARGE DIAGNOSIS:  Principal Problem:   UTI (lower urinary tract infection) Active Problems:   Cardiac arrest ([place])   Acute respiratory failure with hypoxia (HCC)   Acute renal failure (HCC)   Sepsis ([place])   Palliative care by specialist   Acute on chronic renal failure ([place])   Endotracheally intubated   SECONDARY DIAGNOSIS:   Past Medical History:  Diagnosis Date  . Anginal pain ([place])    had angina 2-3 mths prior to visit  . Arthritis   . CHF (congestive heart failure) ([place])   . Chronic kidney disease    follwed by Nephrologist Dr Juanito Doom in [place], Stage 3  . Coronary artery disease   . DDD (degenerative disc disease), cervical   . Diabetes mellitus without complication (HCC)    DIET CONTROLLED   . Dysphagia   . Frequent urination   . GERD (gastroesophageal reflux disease)   . Gout   . Hyperlipidemia   . Hypertension   . MI (myocardial infarction) ([place])   . Orthopnea   . Pancytopenia ([place])   . Shortness of breath dyspnea    at rest, mainly when he's active  . Stroke [facility])    ?? small stroke om 2017  came and went    .pro HOSPITAL COURSE:  The patient is 75 year old African-American male with medical history significant for history of hypertension, hyperlipidemia, coronary artery disease, diabetes mellitus, cardiomyopathy with ejection fraction of 35%, CK D, who presents to the hospital with complaints of hypertension,  elevated lactic acid level. Pyuria, generalized weakness. He was diagnosed with urinary tract infection and admitted. While in the hospital he developed cardiac arrest, PEA and asystole, was resuscitated for about 10 minutes, intubated, and admitted to CCU. He was noted to have worsening kidney failure, initiated CRRT due to anuria. He was followed by cardiologist in the hospital. Palliative care saw patient in consultation and family decided on DO NOT RESUSCITATE. Patient was eventually extubated and transferred to the floor, however, remained very somnolent, was not able to eat due to dysphagia, encephalopathy. He remained anuric and became progressively swollen. He was taken to dialysis, however, became bradycardic and expired. Discussion by problem: #1 acute on chronic renal failure, no urinary output over the past 24 hours, nephrologist is to continue  hemodialysis per patient's family request, the patient was taken to dialysis suite, however, became bradycardic and expired.  #2. Acute respiratory failure with hypoxia due to cardiac arrest, status post intubation and extubation, patient was extubated, however, remained somnolent, poor inspiratory efforts, continue to have intermittent cough, concerning for aspiration. I discussed patient's case with family, however, they wanted to have aggressive therapy, including dialysis, patient, however, deteriorated while in dialysis suite and expired.   #3. Urinary tract infection due to Klebsiella pneumonia, completed course of Zosyn, remained hypotensive, likely due to cardiac reasons.  #4. Severe Sepsis with negative blood cultures due to urinary tract infection due to Klebsiella, treated course of Zosyn, blood pressure readings  #5. Hypokalemia,  supplemented, potassium level was stable #6. Anemia chronic disease, there was a plan to transfuse with next dialysis, however, patient deteriorated and expired #7. Thrombocytopenia,  Stabilized off aspirin,  HIT  testing is still pending.  #8. Hypotension, unlikely due to sepsis, likely cardiac reasons related #9, encephalopathy, supportive therapy was provided, speech therapist evaluated the patient in consultation, recommended nothing by mouth, I discussed patient's case with family extensively, they chose to initiate NG tube, although reluctantly, however, patient expired prior to NG tube placement  DISCHARGE CONDITIONS:   Patient died  CONSULTS OBTAINED:  Treatment Team:  Alexis Goodell, MD Anthonette Legato, [place], MD  DRUG ALLERGIES:   Allergies  Allergen Reactions  . No Known Allergies Other (See Comments)    DISCHARGE MEDICATIONS:   Current Discharge Medication List    CONTINUE these medications which have NOT CHANGED   Details  acetaminophen (TYLENOL) 650 MG CR tablet Take 650 mg by mouth every 8 (eight) hours as needed.    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 Qty: 60 tablet, Refills: 0    colchicine 0.6 MG tablet Take 0.3-0.6 mg by mouth See admin instructions. 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 Qty: 60 tablet, Refills: 0    hydrALAZINE (APRESOLINE) 100 MG tablet Take 100 mg by mouth 2 (two) times daily.    isosorbide mononitrate (IMDUR) 120 MG 24 hr tablet Take 240 mg by mouth daily.    Associated Diagnoses: ITP (idiopathic  thrombocytopenic purpura)    nitroGLYCERIN (NITROSTAT) 0.4 MG SL tablet Place 0.4 mg under the tongue every 5 (five) minutes as needed.    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    ranolazine (RANEXA) 500 MG 12 hr tablet Take 1 tablet (500 mg total) by mouth 2 (two) times daily. Qty: 60 tablet, Refills: 0    tamsulosin (FLOMAX) 0.4 MG CAPS capsule Take 0.4 mg by mouth daily.    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  You Must read complete instructions/literature along with all the possible adverse reactions/side effects for all the Medicines you take and that have been prescribed to you. Take any new Medicines after you have completely understood and accept all the possible adverse reactions/side effects.   Please note  You were cared for by a hospitalist during your hospital stay. If you have any questions about your discharge medications or the care you received while you were in the hospital after  you are discharged, you can call the unit and asked to speak with the hospitalist on call if the hospitalist that took care of you is not available. Once you are discharged, your primary care physician will handle any further medical issues. Please note that NO REFILLS for any discharge medications will be authorized once you are discharged, as it is imperative that you return to your primary care physician (or establish a relationship with a primary care physician if you do not have one) for your aftercare needs so that they can reassess your need for  medications and monitor your lab values.    Today   CHIEF COMPLAINT:   Chief Complaint  Patient presents with  . Extremity Weakness    HISTORY OF PRESENT ILLNESS:  Robert Parks  is a 75 y.o. 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 Pyuria, generalized weakness. He was diagnosed with urinary tract infection and admitted. While in the hospital he developed cardiac arrest, PEA and asystole, was resuscitated for about 10 minutes, intubated, and admitted to CCU. He was noted to have worsening kidney failure, initiated CRRT due to anuria. He was followed by cardiologist in the hospital. Palliative care saw patient in consultation and family decided on DO NOT RESUSCITATE. Patient was eventually extubated and transferred to the floor, however, remained very somnolent, was not able to eat due to dysphagia, encephalopathy. He remained anuric and became progressively swollen. He was taken to dialysis, however, became bradycardic and expired. Discussion by problem: #1 acute on chronic renal failure, no urinary output over the past 24 hours, nephrologist is to continue  hemodialysis per patient's family request, the patient was taken to dialysis suite, however, became bradycardic and expired.  #2. Acute respiratory failure with hypoxia due to cardiac arrest, status post intubation and extubation, patient was extubated, however, remained somnolent, poor inspiratory efforts, continue to have intermittent cough, concerning for aspiration. I discussed patient's case with family, however, they wanted to have aggressive therapy, including dialysis, patient, however, deteriorated while in dialysis suite and expired.   #3. Urinary tract infection due to Klebsiella pneumonia, completed course of Zosyn, remained  hypotensive, likely due to cardiac reasons.  #4. Severe Sepsis with negative blood cultures due to urinary tract infection due to Klebsiella, treated course of Zosyn, blood pressure readings  #5. Hypokalemia, supplemented, potassium level was stable #6. Anemia chronic disease, there was a plan to transfuse with next dialysis, however, patient deteriorated and expired #7. Thrombocytopenia,  Stabilized off aspirin,  HIT testing is still pending.  #8. Hypotension, unlikely due to sepsis, likely cardiac reasons related #9, encephalopathy, supportive therapy was provided, speech therapist evaluated the patient in consultation, recommended nothing by mouth, I discussed patient's case with family extensively, they chose to initiate NG tube, although reluctantly, however, patient expired prior to NG tube placement    VITAL SIGNS:  Blood pressure (!) 117/94, pulse (!) 20, temperature 97.9 F (36.6 C), temperature source Oral, resp. rate (!) 21, height 5\' 8"  (1.727 m), weight 77.9 kg (171 lb 11.8 oz), SpO2 100 %.  I/O:   Intake/Output Summary (Last 24 hours) at 11-14-15 1457 Last data filed at 11-14-15 0800  Gross per 24 hour  Intake                3 ml  Output  3 ml  Net                0 ml    PHYSICAL EXAMINATION:  GENERAL:  75 y.o.-year-old patient lying in the bed with no acute distress.  EYES: Pupils equal, round, reactive to light and accommodation. No scleral icterus. Extraocular muscles intact.  HEENT: Head atraumatic, normocephalic. Oropharynx and nasopharynx clear.  NECK:  Supple, no jugular venous distention. No thyroid enlargement, no tenderness.  LUNGS: Normal breath sounds bilaterally, no wheezing, rales,rhonchi or crepitation. No use of accessory muscles of respiration.  CARDIOVASCULAR: S1, S2 normal. No murmurs, rubs, or gallops.  ABDOMEN: Soft, non-tender, non-distended. Bowel sounds present. No organomegaly or mass.  EXTREMITIES: No pedal edema, cyanosis, or  clubbing.  NEUROLOGIC: Cranial nerves II through XII are intact. Muscle strength 5/5 in all extremities. Sensation intact. Gait not checked.  PSYCHIATRIC: The patient is alert and oriented x 3.  SKIN: No obvious rash, lesion, or ulcer.   DATA REVIEW:   CBC  Recent Labs Lab Nov 27, 2015 0537  WBC 6.5  HGB 7.5*  HCT 22.5*  PLT 58*    Chemistries   Recent Labs Lab 11/06/15 1024  2015/11/27 0537  NA 138  < > 139  K 3.9  < > 4.1  CL 106  < > 106  CO2 25  < > 25  GLUCOSE 135*  < > 163*  BUN 12  < > 50*  CREATININE 0.84  < > 2.49*  CALCIUM 8.3*  < > 8.4*  MG  --   < > 2.2  AST 73*  --   --   ALT 37  --   --   ALKPHOS 78  --   --   BILITOT 1.2  --   --   < > = values in this interval not displayed.  Cardiac Enzymes No results for input(s): TROPONINI in the last 168 hours.  Microbiology Results  Results for orders placed or performed during the hospital encounter of 11/14/2015  Urine culture     Status: Abnormal   Collection Time: 10/30/2015 10:11 AM  Result Value Ref Range Status   Specimen Description URINE, RANDOM  Final   Special Requests NONE  Final   Culture >=100,000 COLONIES/mL KLEBSIELLA PNEUMONIAE (A)  Final   Report Status 11/03/2015 FINAL  Final   Organism ID, Bacteria KLEBSIELLA PNEUMONIAE (A)  Final      Susceptibility   Klebsiella pneumoniae - MIC*    AMPICILLIN >=32 RESISTANT Resistant     CEFAZOLIN <=4 SENSITIVE Sensitive     CEFTRIAXONE <=1 SENSITIVE Sensitive     CIPROFLOXACIN <=0.25 SENSITIVE Sensitive     GENTAMICIN <=1 SENSITIVE Sensitive     IMIPENEM <=0.25 SENSITIVE Sensitive     NITROFURANTOIN <=16 SENSITIVE Sensitive     TRIMETH/SULFA <=20 SENSITIVE Sensitive     AMPICILLIN/SULBACTAM 4 SENSITIVE Sensitive     PIP/TAZO <=4 SENSITIVE Sensitive     Extended ESBL NEGATIVE Sensitive     * >=100,000 COLONIES/mL KLEBSIELLA PNEUMONIAE  Culture, blood (routine x 2)     Status: None   Collection Time: 11/17/2015 11:13 AM  Result Value Ref Range Status    Specimen Description BLOOD RIGHT AC  Final   Special Requests   Final    BOTTLES DRAWN AEROBIC AND ANAEROBIC ANA 8ML AER 10ML   Culture NO GROWTH 5 DAYS  Final   Report Status 11/06/2015 FINAL  Final  Culture, blood (routine x 2)     Status: None  Collection Time: 10/27/2015 11:13 AM  Result Value Ref Range Status   Specimen Description BLOOD LEFT ARM  Final   Special Requests BOTTLES DRAWN AEROBIC AND ANAEROBIC [place]  Final   Culture NO GROWTH 5 DAYS  Final   Report Status 11/06/2015 FINAL  Final  Culture, blood (routine x 2)     Status: None   Collection Time: 11/02/15  2:17 AM  Result Value Ref Range Status   Specimen Description BLOOD RIGHT HAND  Final   Special Requests BOTTLES DRAWN AEROBIC AND ANAEROBIC 5CC  Final   Culture NO GROWTH 5 DAYS  Final   Report Status 11/07/2015 FINAL  Final  Culture, blood (routine x 2)     Status: None   Collection Time: 11/02/15  2:28 AM  Result Value Ref Range Status   Specimen Description BLOOD LEFT HAND  Final   Special Requests BOTTLES DRAWN AEROBIC AND ANAEROBIC [place]  Final   Culture NO GROWTH 5 DAYS  Final   Report Status 11/07/2015 FINAL  Final  MRSA PCR Screening     Status: None   Collection Time: 11/02/15 10:42 AM  Result Value Ref Range Status   MRSA by PCR NEGATIVE NEGATIVE Final    Comment:        The GeneXpert MRSA Assay (FDA approved for NASAL specimens only), is one component of a comprehensive MRSA colonization surveillance program. It is not intended to diagnose MRSA infection nor to guide or monitor treatment for MRSA infections.     RADIOLOGY:  No results found.  EKG:   Orders placed or performed during the hospital encounter of 11/17/2015  . ED EKG  . ED EKG  . EKG 12-Lead  . EKG 12-Lead  . EKG 12-Lead  . EKG 12-Lead      Management plans discussed with the patient, family and they are in agreement.  CODE STATUS:     Code Status Orders        Start     Ordered   11/05/15 1108  Do not attempt  resuscitation (DNR)  Continuous    Question Answer Comment  In the event of cardiac or respiratory ARREST Do not call a "code blue"   In the event of cardiac or respiratory ARREST Do not perform Intubation, CPR, defibrillation or ACLS   In the event of cardiac or respiratory ARREST Use medication by any route, position, wound care, and other measures to relive pain and suffering. May use oxygen, suction and manual treatment of airway obstruction as needed for comfort.      11/05/15 1107    Code Status History    Date Active Date Inactive Code Status Order ID Comments User Context   11/02/2015  1:11 AM 11/05/2015 11:07 AM Full Code [id number]  Flora Lipps, MD Inpatient   10/28/2015  2:53 PM 11/02/2015  1:11 AM Full Code [id number]  Vaughan Basta, MD Inpatient   10/11/2015  5:31 PM 10/12/2015  6:04 PM Full Code [id number]  Demetrios Loll, MD ED      TOTAL TIME TAKING CARE OF THIS PATIENT: 40 minutes.    Theodoro Grist M.D on 12-06-15 at 2:57 PM  Between 7am to 6pm - Pager - [phone]  After 6pm go to [email or web address] - password [email or web address] [place] [facility]  Office  [phone]  CC: Primary care physician; [facility]

## 2015-11-25 NOTE — Progress Notes (Signed)
[facility]  ROUNDING NOTE   Subjective:   Family at bedside. Patient with increasing shortness of breath. More lethargic.   Foley placed yesterday by urology  UOP 0  Objective:  Vital signs in last 24 hours:  Temp:  [97.8 F (36.6 C)-98.4 F (36.9 C)] 98.4 F (36.9 C) (09/16 0407) Pulse Rate:  [78-87] 87 (09/16 0407) Resp:  [14-17] 16 (09/16 0407) BP: (93-106)/(55-62) 106/62 (09/16 0407) SpO2:  [98 %-100 %] 100 % (09/16 0407) Weight:  [77.9 kg (171 lb 12.8 oz)] 77.9 kg (171 lb 12.8 oz) (09/16 0407)  Weight change: -0.227 kg (-8 oz) Filed Weights   11/08/15 0500 11/09/15 0253 23-Nov-2015 0407  Weight: 80.4 kg (177 lb 4 oz) 78.2 kg (172 lb 4.8 oz) 77.9 kg (171 lb 12.8 oz)    Intake/Output: I/O last 3 completed shifts: In: 3 [I.V.:3] Out: 3 [Urine:3]   Intake/Output this shift:  No intake/output data recorded.  Physical Exam: General: ill  Head: [place]/AT, dry mucosal membranes  Eyes: Anicteric, eyes open  Neck: supple  Lungs:  Room air, clear  Heart: S1S2, regular  Abdomen:  +distended  Extremities: + edema dependent  Neurologic: Able to follow commands  Skin: No lesions  Access: Right femoral temp HD catheter 9/9 Dr. Isidore Moos    Basic Metabolic Panel:  Recent Labs Lab 11/06/15 4680  11/07/15 3212  11/07/15 1709 11/08/15 0009 11/08/15 0559 11/08/15 1359 11/09/15 0712 November 23, 2015 0537  NA 137  < > 137  < > 138 138 138  --  140 139  K 3.9  < > 4.1  < > 3.9 3.6 3.4*  --  3.4* 4.1  CL 106  < > 105  < > 106 106 106  --  106 106  CO2 27  < > 23  < > 24 26 27   --  26 25  GLUCOSE 120*  < > 140*  < > 158* 166* 180*  --  183* 163*  BUN 12  < > 13  < > 15 15 16   --  30* 50*  CREATININE 0.90  < > 0.83  < > 0.84 0.77 0.80  --  1.51* 2.49*  CALCIUM 8.3*  < > 8.4*  < > 8.3* 8.4* 8.4*  --  8.3* 8.4*  MG 1.8  --  1.7  --   --   --  1.8  --  1.9 2.2  PHOS 1.7*  1.8*  < > 2.1*  < > 2.4* 1.3* 1.4* 1.5* 3.7 4.4  < > = values in this interval not  displayed.  Liver Function Tests:  Recent Labs Lab 11/06/15 1024  11/07/15 0558 11/07/15 1248 11/07/15 1709 11/08/15 0009 11/08/15 0559  AST 73*  --   --   --   --   --   --   ALT 37  --   --   --   --   --   --   ALKPHOS 78  --   --   --   --   --   --   BILITOT 1.2  --   --   --   --   --   --   PROT 5.2*  --   --   --   --   --   --   ALBUMIN 2.7*  < > 2.6* 2.6* 2.6* 2.5* 2.5*  < > = values in this interval not displayed. No results for input(s): LIPASE, AMYLASE in the last 168  hours. No results for input(s): AMMONIA in the last 168 hours.  CBC:  Recent Labs Lab 11/05/15 0530 11/06/15 0613 11/08/15 0559 11/09/15 0712 12/02/15 0537  WBC 6.6 6.9 6.4 5.6 6.5  HGB 7.7* 7.8* 7.5* 7.6* 7.5*  HCT 22.9* 23.0* 22.3* 23.1* 22.5*  MCV 93.7 94.4 94.8 96.6 97.2  PLT 83* 77* 57* 54* 58*    Cardiac Enzymes: No results for input(s): CKTOTAL, CKMB, CKMBINDEX, TROPONINI in the last 168 hours.  BNP: Invalid input(s): POCBNP  CBG:  Recent Labs Lab 11/09/15 2036 11/09/15 2354 12-02-15 0404 Dec 02, 2015 0826 12/02/2015 1155  GLUCAP 156* 164* 167* 150* 180*    Microbiology: Results for orders placed or performed during the hospital encounter of 11/18/2015  Urine culture     Status: Abnormal   Collection Time: 11/05/2015 10:11 AM  Result Value Ref Range Status   Specimen Description URINE, RANDOM  Final   Special Requests NONE  Final   Culture >=100,000 COLONIES/mL KLEBSIELLA PNEUMONIAE (A)  Final   Report Status 11/03/2015 FINAL  Final   Organism ID, Bacteria KLEBSIELLA PNEUMONIAE (A)  Final      Susceptibility   Klebsiella pneumoniae - MIC*    AMPICILLIN >=32 RESISTANT Resistant     CEFAZOLIN <=4 SENSITIVE Sensitive     CEFTRIAXONE <=1 SENSITIVE Sensitive     CIPROFLOXACIN <=0.25 SENSITIVE Sensitive     GENTAMICIN <=1 SENSITIVE Sensitive     IMIPENEM <=0.25 SENSITIVE Sensitive     NITROFURANTOIN <=16 SENSITIVE Sensitive     TRIMETH/SULFA <=20 SENSITIVE Sensitive      AMPICILLIN/SULBACTAM 4 SENSITIVE Sensitive     PIP/TAZO <=4 SENSITIVE Sensitive     Extended ESBL NEGATIVE Sensitive     * >=100,000 COLONIES/mL KLEBSIELLA PNEUMONIAE  Culture, blood (routine x 2)     Status: None   Collection Time: 11/13/2015 11:13 AM  Result Value Ref Range Status   Specimen Description BLOOD RIGHT AC  Final   Special Requests   Final    BOTTLES DRAWN AEROBIC AND ANAEROBIC ANA 8ML AER 10ML   Culture NO GROWTH 5 DAYS  Final   Report Status 11/06/2015 FINAL  Final  Culture, blood (routine x 2)     Status: None   Collection Time: 11/11/2015 11:13 AM  Result Value Ref Range Status   Specimen Description BLOOD LEFT ARM  Final   Special Requests BOTTLES DRAWN AEROBIC AND ANAEROBIC [place]  Final   Culture NO GROWTH 5 DAYS  Final   Report Status 11/06/2015 FINAL  Final  Culture, blood (routine x 2)     Status: None   Collection Time: 11/02/15  2:17 AM  Result Value Ref Range Status   Specimen Description BLOOD RIGHT HAND  Final   Special Requests BOTTLES DRAWN AEROBIC AND ANAEROBIC 5CC  Final   Culture NO GROWTH 5 DAYS  Final   Report Status 11/07/2015 FINAL  Final  Culture, blood (routine x 2)     Status: None   Collection Time: 11/02/15  2:28 AM  Result Value Ref Range Status   Specimen Description BLOOD LEFT HAND  Final   Special Requests BOTTLES DRAWN AEROBIC AND ANAEROBIC [place]  Final   Culture NO GROWTH 5 DAYS  Final   Report Status 11/07/2015 FINAL  Final  MRSA PCR Screening     Status: None   Collection Time: 11/02/15 10:42 AM  Result Value Ref Range Status   MRSA by PCR NEGATIVE NEGATIVE Final    Comment:  The GeneXpert MRSA Assay (FDA approved for NASAL specimens only), is one component of a comprehensive MRSA colonization surveillance program. It is not intended to diagnose MRSA infection nor to guide or monitor treatment for MRSA infections.     Coagulation Studies: No results for input(s): LABPROT, INR in the last 72 hours.  Urinalysis: No  results for input(s): COLORURINE, LABSPEC, PHURINE, GLUCOSEU, HGBUR, BILIRUBINUR, KETONESUR, PROTEINUR, UROBILINOGEN, NITRITE, LEUKOCYTESUR in the last 72 hours.  Invalid input(s): APPERANCEUR    Imaging: No results found.   Medications:     . chlorhexidine  15 mL Mouth Rinse BID  . [START ON 11/12/2015] famotidine (PEPCID) IV  20 mg Intravenous Q48H  . midodrine  5 mg Oral TID WC  . [START ON 11/11/2015] predniSONE  50 mg Oral Q breakfast  . sodium chloride flush  10-40 mL Intracatheter Q12H  . sodium chloride flush  3 mL Intravenous Q12H   sodium chloride, sodium chloride, acetaminophen, ondansetron (ZOFRAN) IV, sodium chloride flush, sodium chloride flush  Assessment/ Plan:  Robert Parks is a 75 y.o. black male with coronary artery disease, systolic congestive heart failure, diabetes mellitus type II, gout, hypertension, hyperlipidemia, CVA. Admitted on 10/26/2015 for  Urinary tract infection, sepsis, acute respiratory failure and acute renal failure.   1. Acute renal failure on chronic kidney disease stage IV baseline creatinine 2.3 EGFR 29 followed by [facility] nephrology.  Acute renal failure from sepsis, urinary tract infection, hypotension and shock. Most likely ATN with poor chance of recovery.  CRRT from 9/9-9/14.  Family wants to persue aggressive measures currently. Will schedule for intermittent hemodialysis today. Orders prepared.  - Monitor daily for dialysis - Will need temp HD catheter exchanged eventually, placed on 9/9  2. Anemia of chronic kidney disease: hemoglobin 7.5 - EPO with dialysis treatment.  - daily CBC. Low threshold for transfusion  3. Sepsis with urinary tract infection: E. Coli in urine culture. Completed course of IV antibiotics. Continues to be hypotensive.    LOS: [place], [place] 10-07-201712:43 PM

## 2015-11-25 NOTE — Progress Notes (Signed)
Family arriving. Placed in 2nd floor waiting room by Charge Nurse.  Wife has not arrived yet.

## 2015-11-25 NOTE — Progress Notes (Signed)
[place] [facility] [place] NAME: Robert Parks    MR#:  [id number]  DATE OF BIRTH:  1941-02-15  SUBJECTIVE:  CHIEF COMPLAINT:   Chief Complaint  Patient presents with  . Extremity Weakness   The patient is 75 year old African-American male with past history significant for history of angina, arthritis, CHF, cardiomyopathy with ejection fraction of 35%, CK D with baseline creatinine of 2.5, coronary artery disease, who presents to the hospital with hypotension, elevated lactic acid level, pyuria, generalized weakness. Patient was admitted to the hospital for further evaluation and therapy for urinary tract infection. The patient developed cardiac arrest while in the hospital, PA and asystole lasting for approximately 10 minutes, transferred to CCU, intubated . He was noted to have worsening kidney failure, initiated on CRRT. He was seen by cardiologist. While in the hospital, who followed him along. Palliative care was consulted. Patient was extubated and transferred to the floor. He remains nothing by mouth, nonverbal. Not opening his eyes and does not converse, not able to review systems. Patient's family requested aggressive therapy, including NG tube placement, tube feeds, hemodialysis, discussed with nephrologist today and family extensively Review of Systems  Unable to perform ROS: Critical illness    VITAL SIGNS: Blood pressure 106/62, pulse 87, temperature 98.4 F (36.9 C), temperature source Oral, resp. rate 16, height 5\' 8"  (1.727 m), weight 77.9 kg (171 lb 12.8 oz), SpO2 100 %.  PHYSICAL EXAMINATION:   GENERAL:  75 y.o.-year-old patient lying in the bed in mild to moderate distress due to rattling in the chest and throat, guarding. Somnolent and poorly arousable , does not open eyes, does not converse, does not follow commands today EYES: Pupils equal, round, reactive to light and accommodation. No scleral icterus. Extraocular muscles  intact.  HEENT: Head atraumatic, normocephalic. Oropharynx and nasopharynx clear.  NECK:  Supple, no jugular venous distention. No thyroid enlargement, no tenderness.  LUNGS: Markedly Diminishedh sounds bilaterally at bases, no wheezing, diffuse rales,rhonchi, crackles in all lung fields. Intermittent use of accessory muscles of respiration, gargles in the chest/throat CARDIOVASCULAR: S1, S, distant . No murmurs, rubs, or gallops.  ABDOMEN: Soft, nontender, nondistended. Bowel sounds present. No organomegaly or mass.  EXTREMITIES:  2+ lower extremity and pedal edema,, no  cyanosis, or clubbing.  NEUROLOGIC: Cranial nerves II through XII are grossly  intact. Muscle strength 3/5 in all extremities. Sensation grossly  intact. Gait not checked.  PSYCHIATRIC: The patient is somnolent, unable to assess his orientation, does not follow commands, nonverbal, has difficulty even opening eyes, does not track with eyes.  SKIN: No obvious rash, lesion, or ulcer.   ORDERS/RESULTS REVIEWED:   CBC  Recent Labs Lab 11/05/15 0530 11/06/15 0613 11/08/15 0559 11/09/15 0712 2015/11/21 0537  WBC 6.6 6.9 6.4 5.6 6.5  HGB 7.7* 7.8* 7.5* 7.6* 7.5*  HCT 22.9* 23.0* 22.3* 23.1* 22.5*  PLT 83* 77* 57* 54* 58*  MCV 93.7 94.4 94.8 96.6 97.2  MCH 31.5 31.9 31.9 32.0 32.4  MCHC 33.7 33.8 33.7 33.1 33.4  RDW 15.2* 15.7* 15.5* 16.1* 16.4*   ------------------------------------------------------------------------------------------------------------------  Chemistries   Recent Labs Lab 11/06/15 0613 11/06/15 1024  11/07/15 0558  11/07/15 1709 11/08/15 0009 11/08/15 0559 11/09/15 0712 11/21/2015 0537  NA 137 138  < > 137  < > 138 138 138 140 139  K 3.9 3.9  < > 4.1  < > 3.9 3.6 3.4* 3.4* 4.1  CL 106 106  < > 105  < >  106 106 106 106 106  CO2 27 25  < > 23  < > 24 26 27 26 25   GLUCOSE 120* 135*  < > 140*  < > 158* 166* 180* 183* 163*  BUN 12 12  < > 13  < > 15 15 16  30* 50*  CREATININE 0.90 0.84  < > 0.83   < > 0.84 0.77 0.80 1.51* 2.49*  CALCIUM 8.3* 8.3*  < > 8.4*  < > 8.3* 8.4* 8.4* 8.3* 8.4*  MG 1.8  --   --  1.7  --   --   --  1.8 1.9 2.2  AST  --  73*  --   --   --   --   --   --   --   --   ALT  --  37  --   --   --   --   --   --   --   --   ALKPHOS  --  78  --   --   --   --   --   --   --   --   BILITOT  --  1.2  --   --   --   --   --   --   --   --   < > = values in this interval not displayed. ------------------------------------------------------------------------------------------------------------------ estimated creatinine clearance is 24.8 mL/min (by C-G formula based on SCr of 2.49 mg/dL (H)). ------------------------------------------------------------------------------------------------------------------ No results for input(s): TSH, T4TOTAL, T3FREE, THYROIDAB in the last 72 hours.  Invalid input(s): FREET3  Cardiac Enzymes No results for input(s): CKMB, TROPONINI, MYOGLOBIN in the last 168 hours.  Invalid input(s): CK ------------------------------------------------------------------------------------------------------------------ Invalid input(s): POCBNP ---------------------------------------------------------------------------------------------------------------  RADIOLOGY: No results found.  EKG:  Orders placed or performed during the hospital encounter of 11/12/2015  . ED EKG  . ED EKG  . EKG 12-Lead  . EKG 12-Lead  . EKG 12-Lead  . EKG 12-Lead    ASSESSMENT AND PLAN:  Principal Problem:   UTI (lower urinary tract infection) Active Problems:   Cardiac arrest (HCC)   Acute respiratory failure with hypoxia (HCC)   Acute renal failure (HCC)   Sepsis ([place])   Palliative care by specialist   Acute on chronic renal failure ([place])   Endotracheally intubated #1 acute on chronic renal failure, No urinary output over the past 24 hours, nephrologist is to continue  hemodialysis per patient's family request. Foley catheter was replaced by urology 11/09/2015.   Appreciate nephrology's input, continue CRRT. Most likely ATN with poor chance for recovery, according to nephrologist #2. Acute respiratory failure with hypoxia due to cardiac arrest, status post intubation and extubation, continue oxygen therapy as needed, following closely. Patient's fluid overloaded, chest x-ray revealed pulmonary edema, pleural effusions and atelectasis , is to undergo dialysis today  #3. Urinary tract infection due to Klebsiella pneumonia, completed course of Zosyn, remains hypotensive #4. Severe Sepsis with negative blood cultures due to urinary tract infection due to Klebsiella, treated course of Zosyn, blood pressure readings are still low, , off pressors, continue Solu Cortef every 6 hours intravenously, unable to change to midodrine or prednisone taper due to inability to take orally, will place NG tube, then change intravenous medications to oral route #5. Hypokalemia, supplemented, potassium level is relatively stable #6. Anemia chronic disease, transfuse with next dialysis #7. Thrombocytopenia, seemed to be stabilized, now off aspirin, awaiting for HIT testing.  #8. Hypotension, continue patient on steroids intravenously for  now, likely transfuse with next dialysis session due to patient being symptomatic.  Management plans discussed with the patient, family and they are in agreement.   DRUG ALLERGIES:  Allergies  Allergen Reactions  . No Known Allergies Other (See Comments)    CODE STATUS:     Code Status Orders        Start     Ordered   11/05/15 1108  Do not attempt resuscitation (DNR)  Continuous    Question Answer Comment  In the event of cardiac or respiratory ARREST Do not call a "code blue"   In the event of cardiac or respiratory ARREST Do not perform Intubation, CPR, defibrillation or ACLS   In the event of cardiac or respiratory ARREST Use medication by any route, position, wound care, and other measures to relive pain and suffering. May use  oxygen, suction and manual treatment of airway obstruction as needed for comfort.      11/05/15 1107    Code Status History    Date Active Date Inactive Code Status Order ID Comments User Context   11/02/2015  1:11 AM 11/05/2015 11:07 AM Full Code [id number]  Flora Lipps, MD Inpatient   10/30/2015  2:53 PM 11/02/2015  1:11 AM Full Code [id number]  Vaughan Basta, MD Inpatient   10/11/2015  5:31 PM 10/12/2015  6:04 PM Full Code [id number]  Demetrios Loll, MD ED      TOTAL Critical care TIME TAKING CARE OF THIS PATIENT: 40 minutes   discussed with patient's family  Mayco Walrond M.D on 11/14/15 at 1:22 PM  Between 7am to 6pm - Pager - [phone]  After 6pm go to [email or web address] - password [email or web address] [facility]  Office  [phone]  CC: Primary care physician; [facility]

## 2015-11-25 NOTE — Progress Notes (Signed)
MD notified, Pt taken off HD machine

## 2015-11-25 NOTE — Progress Notes (Signed)
Robert Parks ME notified since patient passed during dialysis. [name] states this is not ME case.

## 2015-11-25 NOTE — Progress Notes (Signed)
SLP Cancellation Note  Patient Details Name: Robert Parks MRN: [id number] DOB: 14-Jun-1940   Cancelled treatment:       Reason Eval/Treat Not Completed: Fatigue/lethargy limiting ability to participate;Medical issues which prohibited therapy Attempted to awaken pt, however pt unable to arouse to safely attempt any PO trials at this time. Per nsg palliative care has not yet met w/pt and family. Recommend continue w/NPO and explore alternative means/nutrition/hydration meds.    [name] 11/18/15, 10:33 AM

## 2015-11-25 NOTE — Progress Notes (Signed)
Primary RN notified [facility] about pt status. Spoke with Carmel [place] # [phone]

## 2015-11-25 NOTE — Progress Notes (Signed)
Famotidine renally adjusted to Famotidine 20 mg IV q48 hours.   Larene Beach, PharmD

## 2015-11-25 NOTE — Progress Notes (Signed)
Wife arrived and was placed in private waiting room by [facility] Charge Nurse. I was informed and went to speak with wife per Dr. Keenan Bachelor request. I explained that her husband had passed while in dialysis. I informed wife that Dr. Ether Griffins  did come and was the physician that pronounced. Wife was placed in wheelchair and taken to bedside along with other family members. Chaplin awaiting at bedside to greet family upon their arrival. Bereavement cart ordered for family.

## 2015-11-25 NOTE — Progress Notes (Signed)
B/P decreased, UF goal decreased per MD to 500.

## 2015-11-25 NOTE — Progress Notes (Signed)
       Advanced care Planning    Diagnoses:   Acute on chronic renal failure,  Hemodialysis Acute on chronic respiratory failure with hypoxia Atelectasis, congestive heart failure, pleural effusions Urinary tract infection due to Klebsiella pneumonia Severe sepsis Anemia Thrombocytopenia Hypotension    Present:   Dr. Juleen China Patient's wife DR Ether Griffins   I discussed patient's condition with patient's spouse, explained relationships of problems and complications, patient's wife decided on aggressive therapy, including dialysis, NG tube placement for nutritional support. All questions were answered, she voiced understanding.     Time spent 25 minutes for discussions

## 2015-11-25 NOTE — Progress Notes (Signed)
Spoke with Chaplain. Family has given funeral home information. Family leaving at this time.

## 2015-11-25 NOTE — Progress Notes (Signed)
HD tx started  

## 2015-11-25 NOTE — Progress Notes (Signed)
Pt brady down to the 30's, notified via central telemetry as well as personal observation.  Dr Abigail Butts notified, instructed to take off machine (pt on machine approx 20 minutes) and notify rapid response. Pt was a DNR. Rapid response notiifed and at beside at 1404. House supervisor notified floor staff as arrangements, etc.   Dr Farris Has notified and coming to pronounce patient death.

## 2015-11-25 DEATH — deceased

## 2015-12-06 ENCOUNTER — Ambulatory Visit: Payer: Medicare HMO | Admitting: Oncology

## 2015-12-06 ENCOUNTER — Other Ambulatory Visit: Payer: Medicare HMO

## 2015-12-10 ENCOUNTER — Ambulatory Visit: Payer: Medicare HMO | Admitting: Family

## 2016-02-02 ENCOUNTER — Other Ambulatory Visit: Payer: Self-pay | Admitting: Nurse Practitioner

## 2016-09-21 IMAGING — DX DG ABDOMEN 1V
2 series · 2 of 2 positions shown · non-contrast
Comparison: 11/04/2015

CLINICAL DATA: Generalized abdominal pain and distension

EXAM:
ABDOMEN - 1 VIEW

[abdomen kub (1 of 2)]
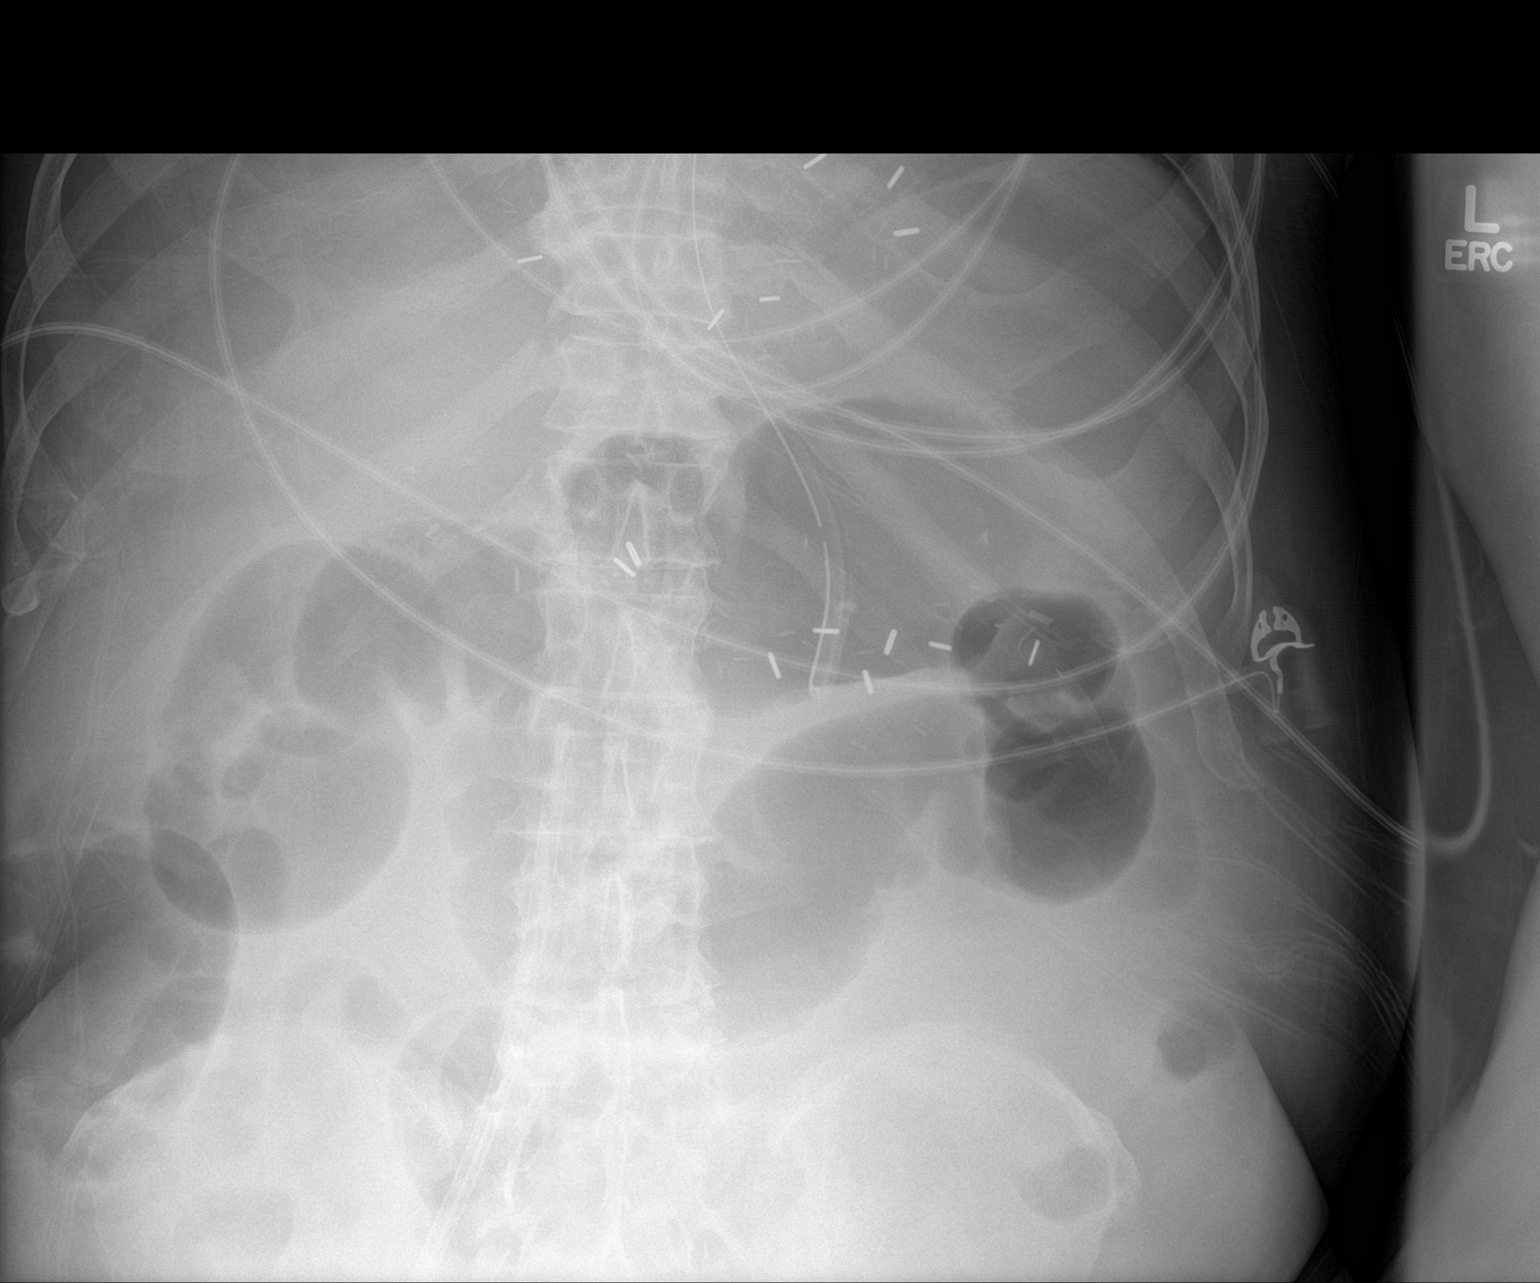

[abdomen kub (2 of 2)]
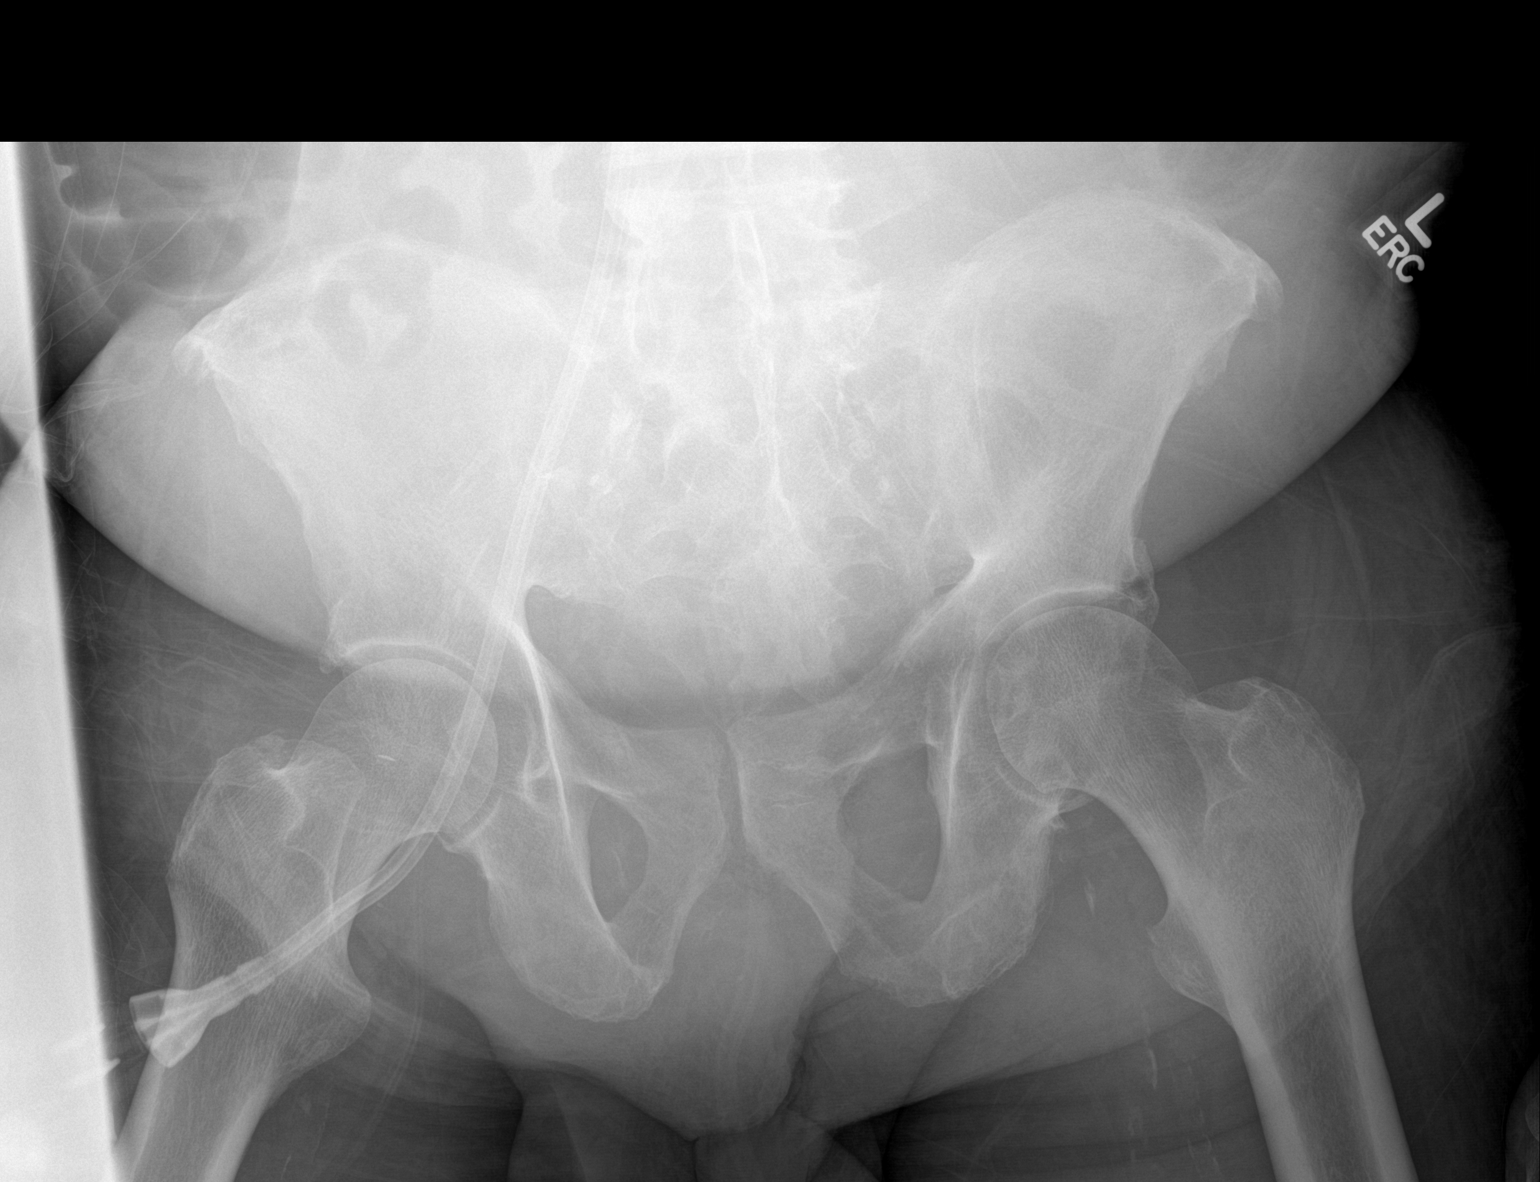

[2 of 2 positions shown; findings below may reference images not displayed]

FINDINGS: There is a nasogastric tube with tip in the body of the stomach.
Right groin central venous catheter tip is in the projection of the
IVC. Mild gaseous distension of the large bowel loops is similar to
previous exam.
IMPRESSION: 1. Stable gaseous distension of the large bowel loops.

## 2018-07-19 IMAGING — US US RENAL
1 series · 14 of 25 positions shown · non-contrast
Comparison: Abdominal ultrasound 05/07/2006

CLINICAL DATA: Acute renal failure

EXAM:
RENAL / URINARY TRACT ULTRASOUND COMPLETE

[Series 1: us renal · 0.23mm/px · 14 of 41 slices shown]
[im 1/41]
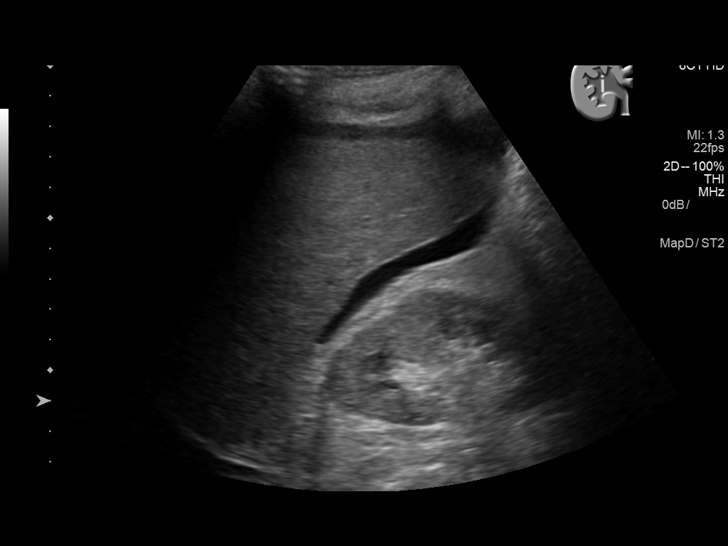
[im 4/41]
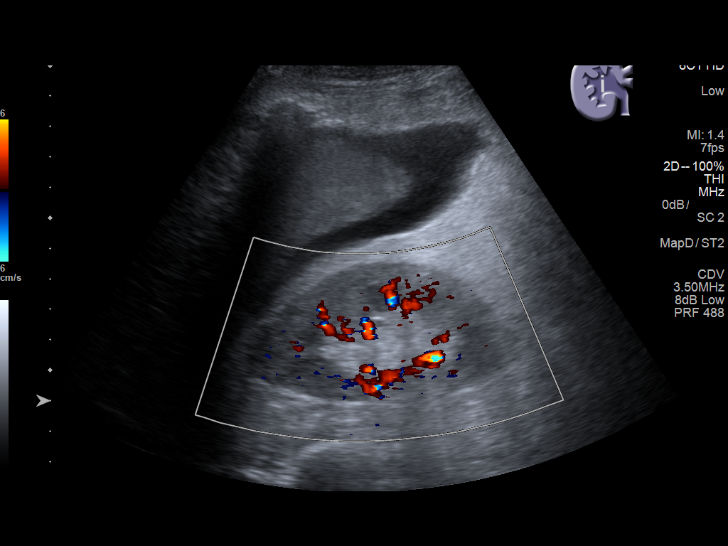
[im 7/41]
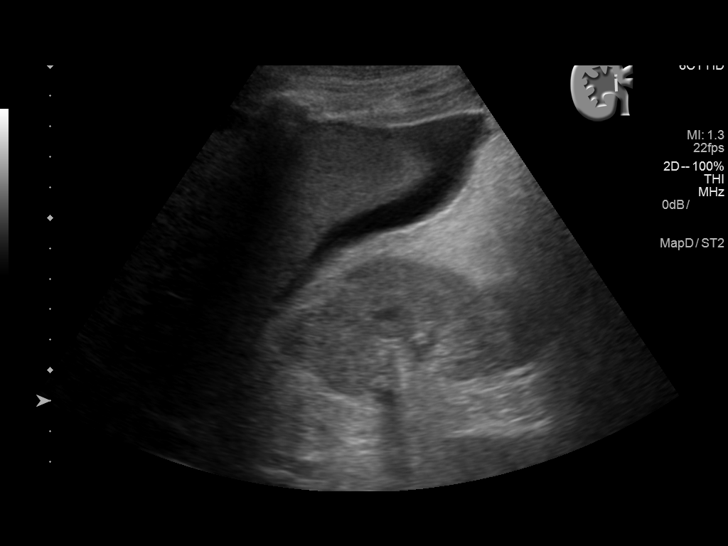
[im 11/41]
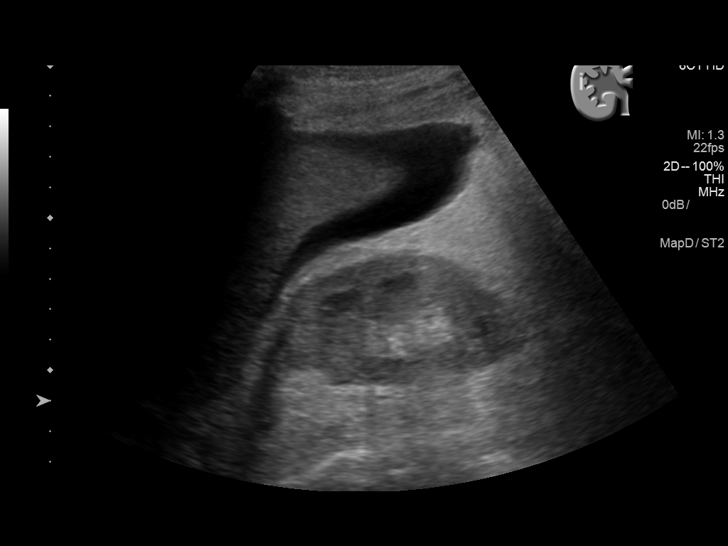
[im 14/41]
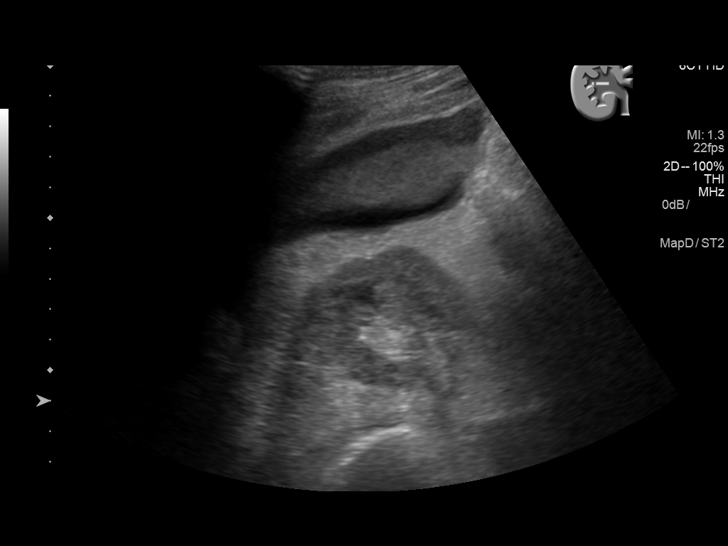
[im 16/41]
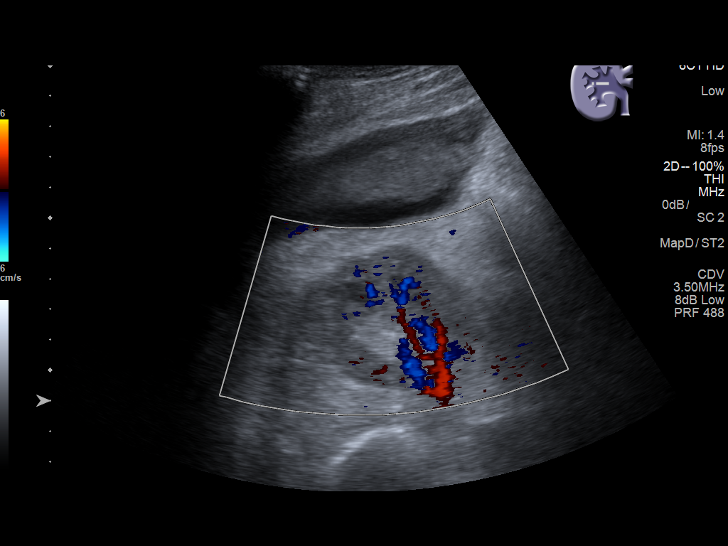
[im 19/41]
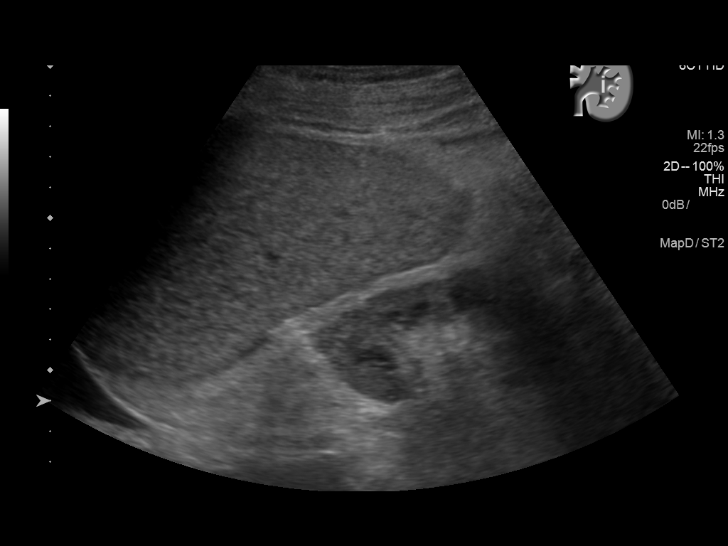
[im 22/41]
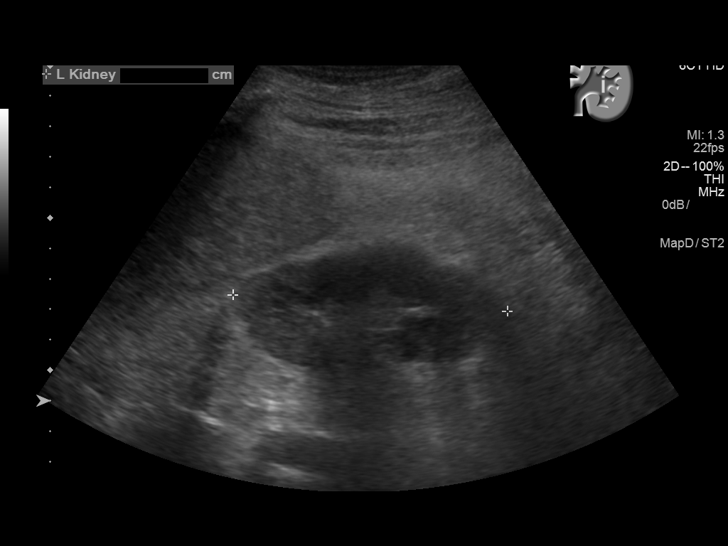
[im 26/41]
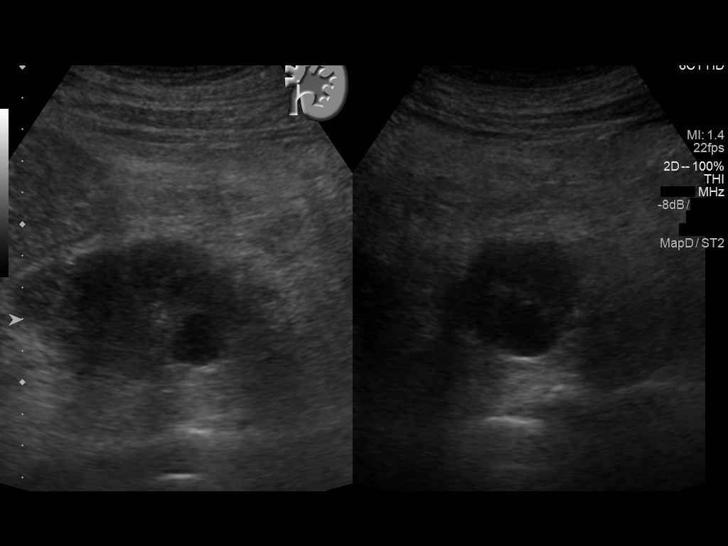
[im 27/41]
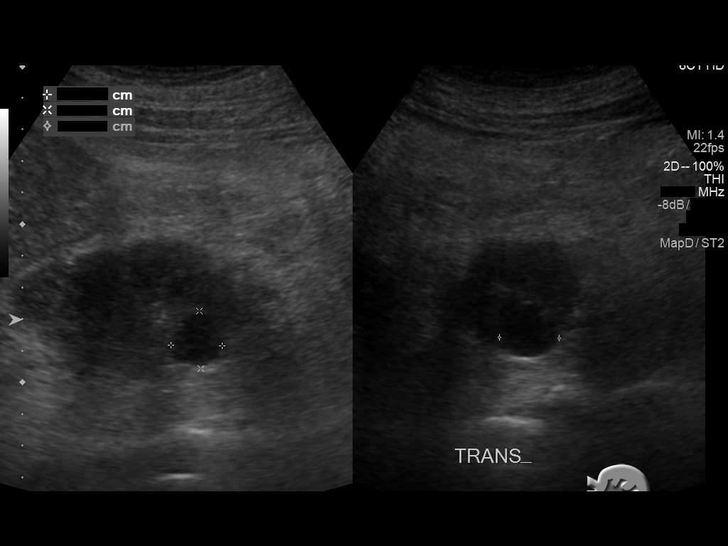
[im 31/41]
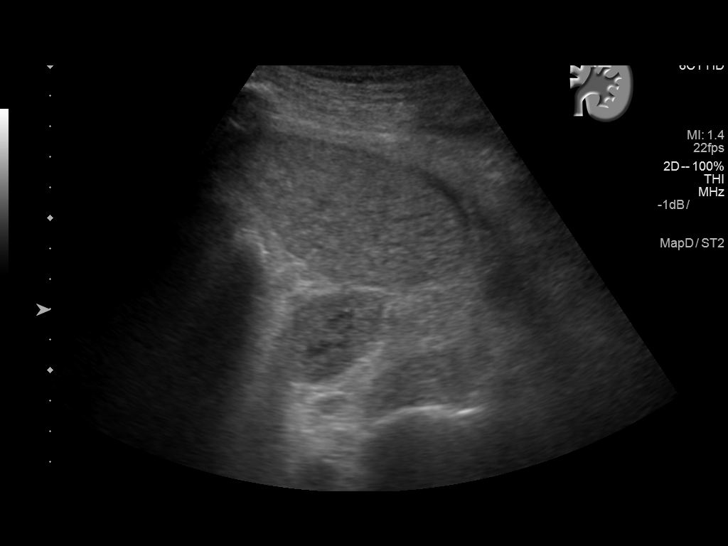
[im 34/41]
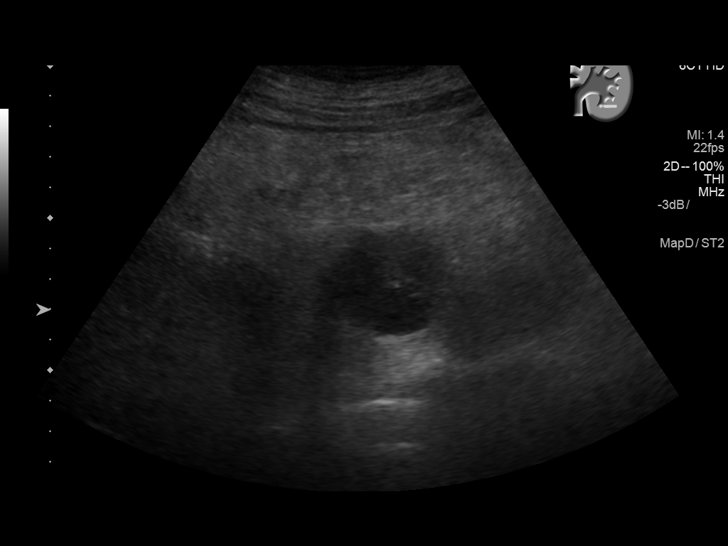
[im 37/41]
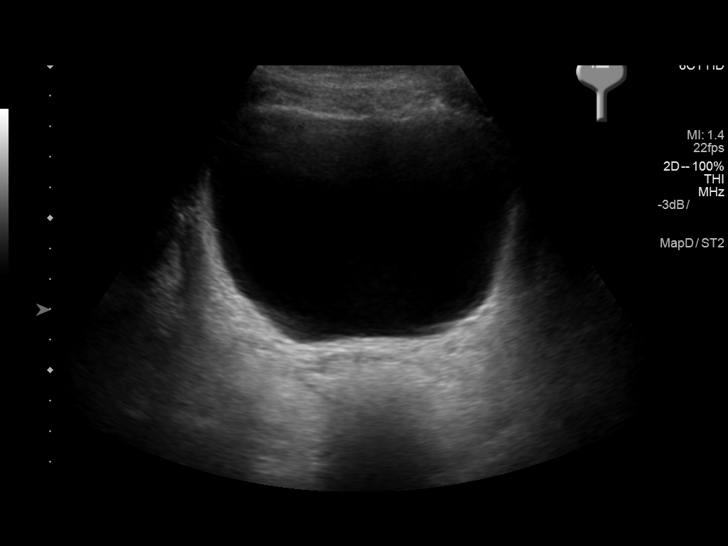
[im 41/41]
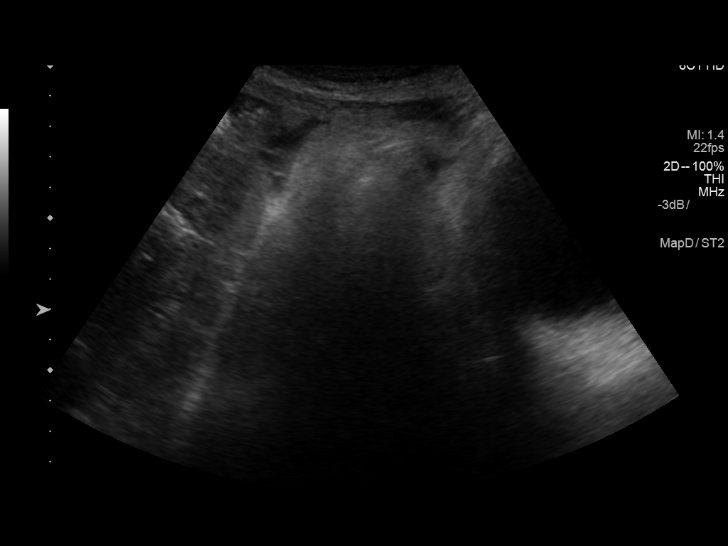

[14 of 25 positions shown; findings below may reference images not displayed]

FINDINGS: Right Kidney:

Length: 9.6 cm. Echogenicity within normal limits. No mass or
hydronephrosis visualized.

Left Kidney:

Length: Measures 9 cm in length. There is normal echogenicity. No
hydronephrosis. There is a hyperechoic cyst in lower pole of the
left kidney measures 1.9 x 1.8 cm.

Bladder:

Appears normal for degree of bladder distention.

Small amount of abdominal ascites.  Left pleural effusion is noted.
IMPRESSION: 1. No hydronephrosis. No renal calculi. Again noted a cyst in lower
pole of the left kidney measures 1.8 x 1.9 cm. On the prior exam
measures 1.6 x 1.5 cm. Unremarkable urinary bladder. Small abdominal
ascites. Left pleural effusion is noted.
